# Patient Record
Sex: Male | Born: 1955 | Race: White | Hispanic: No | Marital: Single | Smoking: Former smoker
Health system: Southern US, Community
[De-identification: ages and names within clinical notes are randomized; demographics above are authoritative.]

## PROBLEM LIST (undated history)

## (undated) DIAGNOSIS — M1990 Unspecified osteoarthritis, unspecified site: Secondary | ICD-10-CM

## (undated) DIAGNOSIS — T1590XA Foreign body on external eye, part unspecified, unspecified eye, initial encounter: Secondary | ICD-10-CM

## (undated) DIAGNOSIS — I10 Essential (primary) hypertension: Secondary | ICD-10-CM

## (undated) DIAGNOSIS — K2970 Gastritis, unspecified, without bleeding: Secondary | ICD-10-CM

## (undated) DIAGNOSIS — K429 Umbilical hernia without obstruction or gangrene: Secondary | ICD-10-CM

## (undated) DIAGNOSIS — M542 Cervicalgia: Secondary | ICD-10-CM

## (undated) DIAGNOSIS — Z86010 Personal history of colonic polyps: Secondary | ICD-10-CM

## (undated) DIAGNOSIS — R7303 Prediabetes: Secondary | ICD-10-CM

## (undated) DIAGNOSIS — I82409 Acute embolism and thrombosis of unspecified deep veins of unspecified lower extremity: Secondary | ICD-10-CM

## (undated) DIAGNOSIS — F1910 Other psychoactive substance abuse, uncomplicated: Secondary | ICD-10-CM

## (undated) DIAGNOSIS — N400 Enlarged prostate without lower urinary tract symptoms: Secondary | ICD-10-CM

## (undated) DIAGNOSIS — G709 Myoneural disorder, unspecified: Secondary | ICD-10-CM

## (undated) DIAGNOSIS — F329 Major depressive disorder, single episode, unspecified: Secondary | ICD-10-CM

## (undated) DIAGNOSIS — K219 Gastro-esophageal reflux disease without esophagitis: Secondary | ICD-10-CM

## (undated) DIAGNOSIS — K649 Unspecified hemorrhoids: Secondary | ICD-10-CM

## (undated) HISTORY — DX: Benign prostatic hyperplasia without lower urinary tract symptoms: N40.0

## (undated) HISTORY — DX: Unspecified hemorrhoids: K64.9

## (undated) HISTORY — PX: HERNIA REPAIR: SHX51

---

## 1988-11-27 HISTORY — PX: WRIST SURGERY: SHX841

## 1997-11-27 HISTORY — PX: NISSEN FUNDOPLICATION: SHX2091

## 1997-11-27 HISTORY — PX: ABDOMINAL SURGERY: SHX537

## 1999-01-25 ENCOUNTER — Ambulatory Visit (HOSPITAL_COMMUNITY): Admission: RE | Admit: 1999-01-25 | Discharge: 1999-01-25 | Payer: Self-pay | Admitting: Specialist

## 2001-06-03 ENCOUNTER — Encounter: Payer: Self-pay | Admitting: Specialist

## 2001-06-03 ENCOUNTER — Encounter: Admission: RE | Admit: 2001-06-03 | Discharge: 2001-06-03 | Payer: Self-pay | Admitting: Specialist

## 2002-02-10 ENCOUNTER — Encounter: Admission: RE | Admit: 2002-02-10 | Discharge: 2002-02-10 | Payer: Self-pay | Admitting: Specialist

## 2002-02-10 ENCOUNTER — Encounter: Payer: Self-pay | Admitting: Specialist

## 2002-07-02 ENCOUNTER — Encounter: Admission: RE | Admit: 2002-07-02 | Discharge: 2002-07-02 | Payer: Self-pay | Admitting: Specialist

## 2002-07-02 ENCOUNTER — Encounter: Payer: Self-pay | Admitting: Specialist

## 2003-01-06 ENCOUNTER — Ambulatory Visit (HOSPITAL_COMMUNITY): Admission: RE | Admit: 2003-01-06 | Discharge: 2003-01-06 | Payer: Self-pay | Admitting: Family Medicine

## 2003-01-06 ENCOUNTER — Encounter: Payer: Self-pay | Admitting: Family Medicine

## 2003-01-07 ENCOUNTER — Ambulatory Visit (HOSPITAL_COMMUNITY): Admission: RE | Admit: 2003-01-07 | Discharge: 2003-01-07 | Payer: Self-pay | Admitting: Family Medicine

## 2003-01-07 ENCOUNTER — Encounter: Payer: Self-pay | Admitting: Family Medicine

## 2004-01-12 ENCOUNTER — Ambulatory Visit (HOSPITAL_COMMUNITY): Admission: RE | Admit: 2004-01-12 | Discharge: 2004-01-12 | Payer: Self-pay | Admitting: Family Medicine

## 2004-01-13 ENCOUNTER — Emergency Department (HOSPITAL_COMMUNITY): Admission: EM | Admit: 2004-01-13 | Discharge: 2004-01-13 | Payer: Self-pay | Admitting: Emergency Medicine

## 2004-05-23 ENCOUNTER — Encounter (INDEPENDENT_AMBULATORY_CARE_PROVIDER_SITE_OTHER): Payer: Self-pay | Admitting: Specialist

## 2004-05-23 ENCOUNTER — Ambulatory Visit (HOSPITAL_COMMUNITY): Admission: RE | Admit: 2004-05-23 | Discharge: 2004-05-23 | Payer: Self-pay | Admitting: *Deleted

## 2005-11-27 HISTORY — PX: BUNIONECTOMY: SHX129

## 2008-04-16 ENCOUNTER — Other Ambulatory Visit: Payer: Self-pay

## 2008-04-16 ENCOUNTER — Ambulatory Visit: Payer: Self-pay | Admitting: Podiatry

## 2008-04-24 ENCOUNTER — Ambulatory Visit: Payer: Self-pay | Admitting: Podiatry

## 2008-06-09 ENCOUNTER — Ambulatory Visit: Payer: Self-pay | Admitting: Gastroenterology

## 2009-08-18 ENCOUNTER — Observation Stay (HOSPITAL_COMMUNITY): Admission: EM | Admit: 2009-08-18 | Discharge: 2009-08-19 | Payer: Self-pay | Admitting: Emergency Medicine

## 2009-08-18 ENCOUNTER — Ambulatory Visit: Payer: Self-pay | Admitting: Cardiology

## 2011-03-03 LAB — CK TOTAL AND CKMB (NOT AT ARMC)
CK, MB: 3.8 ng/mL (ref 0.3–4.0)
Relative Index: 3 — ABNORMAL HIGH (ref 0.0–2.5)
Relative Index: 3.4 — ABNORMAL HIGH (ref 0.0–2.5)
Total CK: 153 U/L (ref 7–232)

## 2011-03-03 LAB — POCT CARDIAC MARKERS: Myoglobin, poc: 86.8 ng/mL (ref 12–200)

## 2011-03-03 LAB — URINALYSIS, ROUTINE W REFLEX MICROSCOPIC
Glucose, UA: NEGATIVE mg/dL
Ketones, ur: NEGATIVE mg/dL
Leukocytes, UA: NEGATIVE
Protein, ur: NEGATIVE mg/dL
pH: 5 (ref 5.0–8.0)

## 2011-03-03 LAB — DIFFERENTIAL
Basophils Relative: 0 % (ref 0–1)
Eosinophils Absolute: 0.1 10*3/uL (ref 0.0–0.7)
Eosinophils Relative: 1 % (ref 0–5)
Neutrophils Relative %: 74 % (ref 43–77)

## 2011-03-03 LAB — CBC
HCT: 43.5 % (ref 39.0–52.0)
MCHC: 33.5 g/dL (ref 30.0–36.0)
MCV: 85.4 fL (ref 78.0–100.0)
Platelets: 235 10*3/uL (ref 150–400)
RDW: 14.7 % (ref 11.5–15.5)

## 2011-03-03 LAB — URINE MICROSCOPIC-ADD ON

## 2011-03-03 LAB — TROPONIN I
Troponin I: 0.01 ng/mL (ref 0.00–0.06)
Troponin I: 0.01 ng/mL (ref 0.00–0.06)
Troponin I: 0.02 ng/mL (ref 0.00–0.06)

## 2011-03-03 LAB — BASIC METABOLIC PANEL
BUN: 30 mg/dL — ABNORMAL HIGH (ref 6–23)
CO2: 26 mEq/L (ref 19–32)
Chloride: 106 mEq/L (ref 96–112)
Creatinine, Ser: 0.95 mg/dL (ref 0.4–1.5)
Glucose, Bld: 100 mg/dL — ABNORMAL HIGH (ref 70–99)
Potassium: 4.3 mEq/L (ref 3.5–5.1)

## 2011-04-14 NOTE — Op Note (Signed)
NAME:  Louis Salazar, Louis Salazar                        ACCOUNT NO.:  [id number]   MEDICAL RECORD NO.:  [id number]                   PATIENT TYPE:  AMB   LOCATION:  ENDO                                 FACILITY:  [facility]   PHYSICIAN:  Georgiana Spinner, M.D.                 DATE OF BIRTH:  1956-04-26   DATE OF PROCEDURE:  DATE OF DISCHARGE:                                 OPERATIVE REPORT   PROCEDURE:  Colonoscopy.   INDICATIONS:  Colon polyp, colon cancer screening and abdominal pain.   ANESTHESIA:  Demerol 100 mg, Versed 10 mg IV, Phenergan 25 mg were given  intravenously in divided doses.   DESCRIPTION OF PROCEDURE:  With the patient mildly sedated and in the left  lateral decubitus position, the Olympus videoscopic colonoscope was inserted  in the rectum.  After normal rectal examination was performed and passed  under direct vision to the cecum, identified by ileocecal valve and base of  the cecum and appendiceal orifice, both of which were photographed.  From  this point,  the colonoscope was slowly withdrawn, taking circumferential  views of the colonic mucosa, stopping only in the rectum where a small polyp  was seen, photographed and removed using hot biopsy forceps technique,  setting at 20/20 blended current.  The endoscope was then placed on  retroflexion and view of the anal canal from above.  The internal  hemorrhoids were noted.  At this point the endoscope was straightened and  withdrawn.  The patient's vital signs and pulse oximetry remained stable.  The patient tolerated the procedure well without apparent complications.   FINDINGS:  Small polyp at rectum, Internal hemorrhoids; otherwise  unremarkable colonoscopic examination to the cecum.   ASSESSMENT:  Etiology of abdominal pain quite possibly adhesions.  No  abnormalities on colonoscopy to explain this.  Small polyp at rectum  removed.   PLAN:  Await biopsy report.  The patient will call me for results and follow  up with  me as an outpatient.                                               Georgiana Spinner, M.D.    GMO/MEDQ  D:  05/23/2004  T:  05/23/2004  Job:  [id number]   cc:   Richrd Humbles, M.D.  [facility]  [address]  [place] [address]   Lorne Skeens. Hoxworth, M.D.  [address]  [place]  [place] [address]  Fax: [phone]

## 2012-08-27 DIAGNOSIS — Z23 Encounter for immunization: Secondary | ICD-10-CM

## 2012-10-25 ENCOUNTER — Ambulatory Visit: Payer: Self-pay | Admitting: Family Medicine

## 2013-08-29 ENCOUNTER — Other Ambulatory Visit: Payer: Self-pay | Admitting: Orthopedic Surgery

## 2013-08-29 DIAGNOSIS — M25511 Pain in right shoulder: Secondary | ICD-10-CM

## 2013-09-08 ENCOUNTER — Ambulatory Visit
Admission: RE | Admit: 2013-09-08 | Discharge: 2013-09-08 | Disposition: A | Discharge status: Home / Self Care | Payer: 59 | Source: Ambulatory Visit | Attending: Orthopedic Surgery | Admitting: Orthopedic Surgery

## 2013-09-08 DIAGNOSIS — M25511 Pain in right shoulder: Secondary | ICD-10-CM

## 2014-02-02 ENCOUNTER — Emergency Department (HOSPITAL_COMMUNITY)
Admission: EM | Admit: 2014-02-02 | Discharge: 2014-02-02 | Disposition: A | Discharge status: Home / Self Care | Payer: 59 | Attending: Emergency Medicine | Admitting: Emergency Medicine

## 2014-02-02 ENCOUNTER — Encounter (HOSPITAL_COMMUNITY): Payer: Self-pay | Admitting: Emergency Medicine

## 2014-02-02 DIAGNOSIS — R040 Epistaxis: Secondary | ICD-10-CM

## 2014-02-02 DIAGNOSIS — F17200 Nicotine dependence, unspecified, uncomplicated: Secondary | ICD-10-CM | POA: Insufficient documentation

## 2014-02-02 DIAGNOSIS — M129 Arthropathy, unspecified: Secondary | ICD-10-CM | POA: Insufficient documentation

## 2014-02-02 DIAGNOSIS — Z79899 Other long term (current) drug therapy: Secondary | ICD-10-CM | POA: Insufficient documentation

## 2014-02-02 HISTORY — DX: Unspecified osteoarthritis, unspecified site: M19.90

## 2014-02-02 HISTORY — DX: Other psychoactive substance abuse, uncomplicated: F19.10

## 2014-02-02 NOTE — Progress Notes (Signed)
   CARE MANAGEMENT ED NOTE 02/02/2014  Patient:  Louis Salazar, Louis Salazar   Account Number:  [id number]  Date Initiated:  02/02/2014  Documentation initiated by:  Livia Snellen  Subjective/Objective Assessment:   Patient presents to Ed with a nose bleed     Subjective/Objective Assessment Detail:   Patient with pmhx of substance abuse and arthritis     Action/Plan:   Action/Plan Detail:   Anticipated DC Date:  02/02/2014     Status Recommendation to Physician:   Result of Recommendation:    Other ED [place]  Other  PCP issues    Choice offered to / List presented to:            Status of service:  Completed, signed off  ED Comments:   ED Comments Detail:  Patient confirms his pcp is Dr. Jacqualine Code in [place]. System updated.

## 2014-02-02 NOTE — ED Notes (Signed)
Pt continues to apply cold compress to nostril. Bleeding slowed to occasional spotting

## 2014-02-02 NOTE — ED Notes (Signed)
Per EMS pt came in for nose bleed that started spontaneously while at work today.  Pt had similar episode last week but stopped on its on. EMS administered couple sprays of Afrin in route but nose currently still bleeding.

## 2014-02-02 NOTE — Discharge Instructions (Signed)

## 2014-02-02 NOTE — ED Provider Notes (Signed)
CSN: [id number]     Arrival date & time 02/02/14  1354 History   First MD Initiated Contact with Patient 02/02/14 1507     Chief Complaint  Patient presents with  . Epistaxis    sudden onset r/nosebleed x 55 min     (Consider location/radiation/quality/duration/timing/severity/associated sxs/prior Treatment) Patient is a 58 y.o. male presenting with nosebleeds. The history is provided by the patient.  Epistaxis Location:  Bilateral Severity:  Mild Duration:  2 hours Timing:  Constant Progression:  Resolved Chronicity:  Recurrent Context: aspirin use and drug use   Context: not anticoagulants (occasional), not foreign body, not nose picking and not recent infection   Relieved by: pressure, afrin. Worsened by:  Nothing tried Associated symptoms: no blood in oropharynx, no congestion, no cough, no facial pain, no fever and no syncope   Risk factors: no alcohol use, no allergies, no change in medication, no radiation treatment and no recent chemotherapy     Past Medical History  Diagnosis Date  . Substance abuse     hx of inhalants  . Arthritis    Past Surgical History  Procedure Laterality Date  . Abdominal surgery    . Hernia repair    . Wrist surgery     Family History  Problem Relation Age of Onset  . Cancer Other    History  Substance Use Topics  . Smoking status: Current Every Day Smoker  . Smokeless tobacco: Current User    Types: Chew  . Alcohol Use: 1.8 oz/week    3 Cans of beer per week    Review of Systems  Constitutional: Negative for fever.  HENT: Positive for nosebleeds. Negative for congestion.   Respiratory: Negative for cough.   Cardiovascular: Negative for syncope.  All other systems reviewed and are negative.      Allergies  Celebrex and Sulfa antibiotics  Home Medications   Current Outpatient Rx  Name  Route  Sig  Dispense  Refill  . finasteride (PROSCAR) 5 MG tablet   Oral   Take 5 mg by mouth daily.         Marland Kitchen  glucosamine-chondroitin 500-400 MG tablet   Oral   Take 1 tablet by mouth 2 (two) times daily.         . tamsulosin (FLOMAX) 0.4 MG CAPS capsule   Oral   Take 0.4 mg by mouth daily.         . traMADol (ULTRAM) 50 MG tablet   Oral   Take 100-150 mg by mouth 2 (two) times daily. Takes 150mg  in the morning and 100mg  at lunch time         . zolpidem (AMBIEN) 10 MG tablet   Oral   Take 10 mg by mouth at bedtime as needed for sleep.          BP 147/102  Pulse 119  Temp(Src) 98.3 F (36.8 C)  Resp 22  SpO2 95% Physical Exam  Constitutional: He is oriented to person, place, and time. He appears well-developed and well-nourished. No distress.  HENT:  Head: Normocephalic and atraumatic.  Nose: Epistaxis (mild anterior bleedingo on septum bilaterally) is observed.  Mouth/Throat: No oropharyngeal exudate.  Eyes: EOM are normal. Pupils are equal, round, and reactive to light.  Neck: Normal range of motion. Neck supple.  Cardiovascular: Normal rate and regular rhythm.  Exam reveals no friction rub.   No murmur heard. Pulmonary/Chest: Effort normal and breath sounds normal. No respiratory distress. He has no wheezes. He  has no rales.  Abdominal: He exhibits no distension. There is no tenderness. There is no rebound.  Musculoskeletal: Normal range of motion. He exhibits no edema.  Neurological: He is alert and oriented to person, place, and time.  Skin: He is not diaphoretic.    ED Course  EPISTAXIS MANAGEMENT Date/Time: 02/02/2014 5:22 PM Performed by: Osvaldo Shipper Authorized by: Osvaldo Shipper Consent: Verbal consent obtained. Patient sedated: no Treatment site: right anterior Repair method: silver nitrate Post-procedure assessment: bleeding stopped Treatment complexity: simple Patient tolerance: Patient tolerated the procedure well with no immediate complications.   (including critical care time) Labs Review Labs Reviewed - No data to display Imaging  Review No results found.   EKG Interpretation None      MDM   Final diagnoses:  Anterior epistaxis    69M here with epistaxis. Resolved on my exam. Anterior bleeders seen in bilateral nares. Treatment as above. No further nosebleed after cauterization. Patient stable for discharge.  Osvaldo Shipper, MD 02/02/14 1723

## 2014-02-02 NOTE — ED Notes (Signed)
Pt reports small nose bleed , intermittently last week. "This episode is much worse" Per EMS , did not saturate towel, towel has spots of blood

## 2014-06-08 ENCOUNTER — Encounter (HOSPITAL_COMMUNITY): Payer: Self-pay | Admitting: Emergency Medicine

## 2014-06-08 ENCOUNTER — Emergency Department (HOSPITAL_COMMUNITY): Payer: 59

## 2014-06-08 ENCOUNTER — Emergency Department (HOSPITAL_COMMUNITY)
Admission: EM | Admit: 2014-06-08 | Discharge: 2014-06-08 | Disposition: A | Discharge status: Home / Self Care | Payer: 59 | Attending: Emergency Medicine | Admitting: Emergency Medicine

## 2014-06-08 DIAGNOSIS — H15102 Unspecified episcleritis, left eye: Secondary | ICD-10-CM

## 2014-06-08 DIAGNOSIS — H11419 Vascular abnormalities of conjunctiva, unspecified eye: Secondary | ICD-10-CM | POA: Insufficient documentation

## 2014-06-08 DIAGNOSIS — H15099 Other scleritis, unspecified eye: Secondary | ICD-10-CM | POA: Insufficient documentation

## 2014-06-08 DIAGNOSIS — F17200 Nicotine dependence, unspecified, uncomplicated: Secondary | ICD-10-CM | POA: Insufficient documentation

## 2014-06-08 DIAGNOSIS — H15109 Unspecified episcleritis, unspecified eye: Principal | ICD-10-CM

## 2014-06-08 DIAGNOSIS — I10 Essential (primary) hypertension: Secondary | ICD-10-CM | POA: Insufficient documentation

## 2014-06-08 DIAGNOSIS — Z888 Allergy status to other drugs, medicaments and biological substances status: Secondary | ICD-10-CM | POA: Insufficient documentation

## 2014-06-08 DIAGNOSIS — H5710 Ocular pain, unspecified eye: Secondary | ICD-10-CM | POA: Insufficient documentation

## 2014-06-08 DIAGNOSIS — H5789 Other specified disorders of eye and adnexa: Secondary | ICD-10-CM | POA: Insufficient documentation

## 2014-06-08 DIAGNOSIS — M129 Arthropathy, unspecified: Secondary | ICD-10-CM | POA: Insufficient documentation

## 2014-06-08 DIAGNOSIS — Z79899 Other long term (current) drug therapy: Secondary | ICD-10-CM | POA: Insufficient documentation

## 2014-06-08 DIAGNOSIS — R519 Headache, unspecified: Secondary | ICD-10-CM

## 2014-06-08 DIAGNOSIS — F1910 Other psychoactive substance abuse, uncomplicated: Secondary | ICD-10-CM | POA: Insufficient documentation

## 2014-06-08 DIAGNOSIS — Z882 Allergy status to sulfonamides status: Secondary | ICD-10-CM | POA: Insufficient documentation

## 2014-06-08 DIAGNOSIS — Z7982 Long term (current) use of aspirin: Secondary | ICD-10-CM | POA: Insufficient documentation

## 2014-06-08 DIAGNOSIS — R51 Headache: Secondary | ICD-10-CM

## 2014-06-08 HISTORY — DX: Essential (primary) hypertension: I10

## 2014-06-08 MED ORDER — ARTIFICIAL TEARS OP OINT: TOPICAL_OINTMENT | Freq: Four times a day (QID) | OPHTHALMIC | Status: DC

## 2014-06-08 MED ORDER — TETRACAINE HCL 0.5 % OP SOLN
2.0000 [drp] | Freq: Once | OPHTHALMIC | Status: AC
  Administered 2014-06-08: 2 [drp] via OPHTHALMIC
  Filled 2014-06-08: qty 2

## 2014-06-08 MED ORDER — FLUORESCEIN SODIUM 1 MG OP STRP
1.0000 | ORAL_STRIP | Freq: Once | OPHTHALMIC | Status: AC
  Administered 2014-06-08: 1 via OPHTHALMIC
  Filled 2014-06-08: qty 1

## 2014-06-08 NOTE — Discharge Instructions (Signed)
Scleritis and Episcleritis The outer part of the eyeball is covered with a tough fibrous covering called the sclera. It is the white part of the eye. This tough covering also has a thin membrane lying on top of it called the episclera.   When the sclera becomes red and sore (inflamed), it is called scleritis.  When the episclera becomes inflamed, it is called episcleritis. CAUSES   Scleritis is usually more severe and is associated with autoimmune diseases such as:  Rheumatoid arthritis.  Inflammations of the bowel such as Crohn's Disease (regional enteritis).  Ulcerative colitis.  Episcleritis usually has no known cause. SYMPTOMS  Both scleritis and episcleritis cause red patches or a nodule on the eye. DIAGNOSIS  This condition should be examined by an ophthalmologist. This is because very strong medications that have side effects to the body and eye may be required to treat severe attacks. Further investigations into the patient's general health may be necessary. TREATMENT   Episcleritis tends to get better without treatment within a week or two.  Scleritis is more severe. Often, your caregiver will prescribe steroids by mouth (orally) or as drops in the eye. This treatment helps lessen the redness and soreness (inflammation). HOME CARE INSTRUCTIONS   Take all medications as directed.  Keep your follow-up appointments as directed.  Avoid irritation of the involved eye(s).  Stop using hard or soft contact lenses until your caregiver tells you that it is safe to use them. SEEK MEDICAL CARE IF:   Redness or irritation gets worse.  You develop pain or sensitivity to light.  You develop any change in vision in the involved eye(s). Document Released: 11/07/2001 Document Revised: 02/05/2012 Document Reviewed: 03/11/2009 [facility] Patient Information 2015 [place], [place]. This information is not intended to replace advice given to you by your health care provider. Make sure you  discuss any questions you have with your health care provider.   You are having a headache. No specific cause was found today for your headache. It may have been a migraine or other cause of headache. Stress, anxiety, fatigue, and depression are common triggers for headaches. Your headache today does not appear to be life-threatening or require hospitalization, but often the exact cause of headaches is not determined in the emergency department. Therefore, follow-up with your doctor is very important to find out what may have caused your headache, and whether or not you need any further diagnostic testing or treatment. Sometimes headaches can appear benign (not harmful), but then more serious symptoms can develop which should prompt an immediate re-evaluation by your doctor or the emergency department. SEEK MEDICAL ATTENTION IF: You develop possible problems with medications prescribed.  The medications don't resolve your headache, if it recurs , or if you have multiple episodes of vomiting or can't take fluids. You have a change from the usual headache. RETURN IMMEDIATELY IF you develop a sudden, severe headache or confusion, become poorly responsive or faint, develop a fever above 100.62F or problem breathing, have a change in speech, vision, swallowing, or understanding, or develop new weakness, numbness, tingling, incoordination, or have a seizure.

## 2014-06-08 NOTE — ED Notes (Signed)
Patient transported to CT 

## 2014-06-08 NOTE — ED Notes (Addendum)
Per pt, headache to left side of head behind left eye starting at 9 pm last night.  This morning, pt woke up with pain and irritation to left eye.  Pt has redness to eye with no discharge. Headache has decreased.  States eye feels irritated.  Pt also had eye discomfort a month ago and had it checked with no dx.  Pt states this feels different.  Pt is not having any dizziness, orientation changes or changes in speech. Neuro status WNL

## 2014-06-08 NOTE — ED Provider Notes (Signed)
CSN: [id number]     Arrival date & time 06/08/14  0749 History   First MD Initiated Contact with Patient 06/08/14 [phone]     Chief Complaint  Patient presents with  . Headache  . Eye Pain     (Consider location/radiation/quality/duration/timing/severity/associated sxs/prior Treatment) HPI 58 year old male presents after developing a headache last night approximately 12 hours ago. States was gradually worsening when he started to get a bed. He felt like the pain was behind his left eye. The pain resolved and is able to get a bed. This morning he noticed his left lateral eye was swollen and red. He feels a mild discomfort as well. Denies any photophobia or blurred vision. No floaters. The headache is no longer present. The patient frequently gets left sided headaches that have been coming on more often but are different than the headache he states last night. About a month ago he had a similar type redness to his left thigh. He went to the ophthalmologist and they prescribed some drops but by the time he started using that his symptoms had dissipated. He is most concerned that he has a tumor causing his recurrent headaches.  Past Medical History  Diagnosis Date  . Substance abuse     hx of inhalants, last use 2010  . Arthritis   . Hypertension    Past Surgical History  Procedure Laterality Date  . Abdominal surgery    . Hernia repair    . Wrist surgery     Family History  Problem Relation Age of Onset  . Cancer Other    History  Substance Use Topics  . Smoking status: Current Every Day Smoker  . Smokeless tobacco: Current User    Types: Chew  . Alcohol Use: 1.8 oz/week    3 Cans of beer per week    Review of Systems  Eyes: Positive for pain and redness. Negative for photophobia, discharge and visual disturbance.  Gastrointestinal: Negative for vomiting.  Neurological: Positive for headaches. Negative for weakness and numbness.  All other systems reviewed and are  negative.     Allergies  Celebrex and Sulfa antibiotics  Home Medications   Prior to Admission medications   Medication Sig Start Date End Date Taking? Authorizing Provider  finasteride (PROSCAR) 5 MG tablet Take 5 mg by mouth daily.    Historical Provider, MD  glucosamine-chondroitin 500-400 MG tablet Take 1 tablet by mouth 2 (two) times daily.    Historical Provider, MD  tamsulosin (FLOMAX) 0.4 MG CAPS capsule Take 0.4 mg by mouth daily.    Historical Provider, MD  traMADol (ULTRAM) 50 MG tablet Take 100-150 mg by mouth 2 (two) times daily. Takes 150mg  in the morning and 100mg  at lunch time    Historical Provider, MD  zolpidem (AMBIEN) 10 MG tablet Take 10 mg by mouth at bedtime as needed for sleep.    Historical Provider, MD   BP 121/93  Pulse 93  Temp(Src) 98.1 F (36.7 C) (Oral)  Resp 20  SpO2 99% Physical Exam  Nursing note and vitals reviewed. Constitutional: He is oriented to person, place, and time. He appears well-developed and well-nourished.  HENT:  Head: Normocephalic and atraumatic.  Right Ear: External ear normal.  Left Ear: External ear normal.  Nose: Nose normal.  Eyes: EOM and lids are normal. Pupils are equal, round, and reactive to light. Lids are everted and swept, no foreign bodies found. Right eye exhibits no discharge. Left eye exhibits no discharge. No foreign body present  in the left eye. Left conjunctiva is injected.  Slit lamp exam:      The left eye shows no corneal abrasion and no corneal ulcer.    Pupils constricted bilaterally but are equal and react normally  Neck: Neck supple.  Cardiovascular: Normal rate, regular rhythm, normal heart sounds and intact distal pulses.   Pulmonary/Chest: Effort normal.  Abdominal: Soft. There is no tenderness.  Musculoskeletal: He exhibits no edema.  Neurological: He is alert and oriented to person, place, and time.  Normal strength in all 4 extremities.   Skin: Skin is warm and dry.    ED Course   Procedures (including critical care time) Labs Review Labs Reviewed - No data to display  Imaging Review Ct Head Wo Contrast  06/08/2014   CLINICAL DATA:  58 year old male with headache and eye pain.  EXAM: CT HEAD WITHOUT CONTRAST  TECHNIQUE: Contiguous axial images were obtained from the base of the skull through the vertex without intravenous contrast.  COMPARISON:  None.  FINDINGS: No intracranial abnormalities are identified, including mass lesion or mass effect, hydrocephalus, extra-axial fluid collection, midline shift, hemorrhage, or acute infarction.  The visualized bony calvarium is unremarkable.  A 3 mm linear hyperdense structure anterior to the left globe may lie within the soft tissue or eyelid - correlate clinically.  The globes and orbits are otherwise unremarkable.  IMPRESSION: No evidence of intracranial abnormality.  3 mm possible soft tissue foreign body anterior to the left globe - correlate clinically.   Electronically Signed   By: Hassan Rowan M.D.   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 He has no vision changes, photophobia, or significant pain or discharge. The patient's IOP is 17. Highly doubt glaucoma. His headache that he didn't seem related as he is not having a headache at this time. Heart is rate words had daily headaches for some time. CT shows no mass in this middle soft tissue foreign body has been present for 30 years per the patient and his left eyelid. On my exam there are no foreign bodies under his eye. No signs of corneal abrasion. We'll treat with artificial tears recommend ASAP followup with his ophthalmologist. He will followup later this afternoon.    Ephraim Hamburger, MD 06/08/14 [phone]

## 2014-06-08 NOTE — ED Notes (Signed)
Pt now says his eye pain is 1/10. Pt reports the headache is barely noticeable, but its the eye swelling and redness that concerns patient. Was going to go to an eye doctor but with the headache he was told to come to ED.

## 2015-04-05 ENCOUNTER — Telehealth: Payer: Self-pay | Admitting: Pain Medicine

## 2015-04-05 NOTE — Telephone Encounter (Signed)
Returning call to Caryl Pina to sched np appt.   [phone]

## 2015-04-27 ENCOUNTER — Encounter: Payer: Self-pay | Admitting: Pain Medicine

## 2015-04-27 ENCOUNTER — Ambulatory Visit: Payer: 59 | Attending: Pain Medicine | Admitting: Pain Medicine

## 2015-04-27 ENCOUNTER — Encounter (INDEPENDENT_AMBULATORY_CARE_PROVIDER_SITE_OTHER): Payer: Self-pay

## 2015-04-27 VITALS — BP 130/107 | HR 94 | Temp 98.2°F | Resp 18 | Ht 69.0 in | Wt 180.0 lb

## 2015-04-27 DIAGNOSIS — M25511 Pain in right shoulder: Secondary | ICD-10-CM | POA: Diagnosis present

## 2015-04-27 DIAGNOSIS — M7550 Bursitis of unspecified shoulder: Secondary | ICD-10-CM | POA: Insufficient documentation

## 2015-04-27 DIAGNOSIS — M19019 Primary osteoarthritis, unspecified shoulder: Secondary | ICD-10-CM | POA: Insufficient documentation

## 2015-04-27 DIAGNOSIS — G5600 Carpal tunnel syndrome, unspecified upper limb: Secondary | ICD-10-CM | POA: Insufficient documentation

## 2015-04-27 DIAGNOSIS — M19012 Primary osteoarthritis, left shoulder: Secondary | ICD-10-CM

## 2015-04-27 DIAGNOSIS — M19011 Primary osteoarthritis, right shoulder: Secondary | ICD-10-CM | POA: Insufficient documentation

## 2015-04-27 NOTE — Progress Notes (Signed)
Discharged to home ambulatory. Pre procedure inst given. teachback 3 done. Safety precautions to be maintained throughout the outpatient stay will include: orient to surroundings, keep bed in low position, maintain call bell within reach at all times, provide assistance with transfer out of bed and ambulation.

## 2015-04-27 NOTE — Progress Notes (Signed)
   Subjective:    Patient ID: Louis Salazar, male    DOB: [birth year], 59 y.o.   MRN: [id number]  HPI Patient is 59 year old gentleman who comes to pain management Center at the request of Dr. Luan Pulling for further evaluation and treatment of pain involving the region of the shoulder especially the right shoulder . Patient is with history of pain involving the region of the right shoulder wishes interferes with activities of daily living to significant degree.. The patient describes pain as aching heavy horrible sharp throbbing tingling uncomfortable sensation awakening patient's from sleep and interfering with patient ability to go to sleep the pain occasionally has been associated with tingling sensation and numbness. Patient states that the pain increases with bending kneeling lifting sitting standing walking and the pain is decreased with sleeping Use of TENS unit and medications. Patient states that he is undergone prior treatment of the shoulder including interventional treatment without surgical intervention area we discussed patient's condition and will consider patient for interventional treatment time return appointment in attempt to decrease severity of symptoms and hopefully minimize progression of patient's symptoms and as well as hopefully avoid the potential for medication escalation and need for more involved treatment. Was understanding and agreed with suggested treatment plan.     Review of Systems Cardiovascular High blood pressure  Pulmonary Unremarkable   neurological Unremarkable  Psychological Unremarkable  Gastrointestinal Constipation  Genitourinary  Unremarkable  Hematological Unremarkable  Endocrine Unremarkable  Rheumatological Unremarkable  Other significant history Unremarkable         Objective:   Physical Exam  There was tenderness of the splenius capitis and occipitalis musculature region of moderate degree to moderately severe degree  with severe tenderness over the trapezius levator scapula and rhomboid musculature regions especially on the right. There appeared to be with unremarkable Spurling's maneuver. Patient had difficulty performing the drop test. Grip strength appeared to be decreased slightly. Tinel and Phalen maneuver with mild to moderate increased pain. Palpation of the mid and lower thoracic region were was tenderness to palpation without crepitus of the thoracic region noted. Palpation over the lumbar paraspinal musculatures and lumbar facet region associated with mild to moderate discomfort with lateral bending and rotation reproducing mild to moderate discomfort. Excessive tends to palpation noted over the spinous processes of the cervical thoracic or lumbar regions. Moderate tenderness of the PSIS and PII S regions. There was negative clonus negative Homans. No definite sensory deficit of dermatomal distribution detected. Abdomen nontender. No costovertebral angle tenderness noted.       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  Suprascapular nerve block to be performed at time of return appointment  F/U PCP for evaliation of  BP and general medical  condition. Patient informed to follow-up with Dr. Luan Pulling this week for evaluation of elevated blood pressure and for evaluation of general medical condition  F/U surgical evaluation.  F/U neurological evaluation.  May consider radiofrequency rhizolysis or intraspinal procedures pending response to present treatment and F/U evaluation.  Patient to call Pain Management Center should patient have concerns prior to scheduled return appointment.

## 2015-04-27 NOTE — Patient Instructions (Addendum)
Continue present medications  Suprascapular nerve block Monday, 05/10/2015.  F/U PCP for evaliation of  BP and general medical  condition.. See Dr. Luan Pulling for blood pressure which was elevated on today's evaluation   F/U surgical evaluation.  F/U neurological evaluation.  May consider radiofrequency rhizolysis or intraspinal procedures pending response to present treatment and F/U evaluation.  Patient to call [facility] should patient have concerns prior to scheduled return appointment. Pain Management Discharge Instructions  General Discharge Instructions :  If you need to reach your doctor call: Monday-Friday 8:00 am - 4:00 pm at [phone] or toll free [phone].  After clinic hours [phone] to have operator reach doctor.  Bring all of your medication bottles to all your appointments in the pain clinic.  To cancel or reschedule your appointment with Pain Management please remember to call 24 hours in advance to avoid a fee.  Refer to the educational materials which you have been given on: General Risks, I had my Procedure. Discharge Instructions, Post Sedation.  Post Procedure Instructions:  The drugs you were given will stay in your system until tomorrow, so for the next 24 hours you should not drive, make any legal decisions or drink any alcoholic beverages.  You may eat anything you prefer, but it is better to start with liquids then soups and crackers, and gradually work up to solid foods.  Please notify your doctor immediately if you have any unusual bleeding, trouble breathing or pain that is not related to your normal pain.  Depending on the type of procedure that was done, some parts of your body may feel week and/or numb.  This usually clears up by tonight or the next day.  Walk with the use of an assistive device or accompanied by an adult for the 24 hours.  You may use ice on the affected area for the first 24 hours.  Put ice in a Ziploc bag  and cover with a towel and place against area 15 minutes on 15 minutes off.  You may switch to heat after 24 hours.GENERAL RISKS AND COMPLICATIONS  What are the risk, side effects and possible complications? Generally speaking, most procedures are safe.  However, with any procedure there are risks, side effects, and the possibility of complications.  The risks and complications are dependent upon the sites that are lesioned, or the type of nerve block to be performed.  The closer the procedure is to the spine, the more serious the risks are.  Great care is taken when placing the radio frequency needles, block needles or lesioning probes, but sometimes complications can occur.  Infection: Any time there is an injection through the skin, there is a risk of infection.  This is why sterile conditions are used for these blocks.  There are four possible types of infection.  Localized skin infection.  Central Nervous System Infection-This can be in the form of Meningitis, which can be deadly.  Epidural Infections-This can be in the form of an epidural abscess, which can cause pressure inside of the spine, causing compression of the spinal cord with subsequent paralysis. This would require an emergency surgery to decompress, and there are no guarantees that the patient would recover from the paralysis.  Discitis-This is an infection of the intervertebral discs.  It occurs in about 1% of discography procedures.  It is difficult to treat and it may lead to surgery.        2. Pain: the needles have to go through skin and  soft tissues, will cause soreness.       3. Damage to internal structures:  The nerves to be lesioned may be near blood vessels or    other nerves which can be potentially damaged.       4. Bleeding: Bleeding is more common if the patient is taking blood thinners such as  aspirin, Coumadin, Ticiid, Plavix, etc., or if he/she have some genetic predisposition  such as hemophilia. Bleeding into  the spinal canal can cause compression of the spinal  cord with subsequent paralysis.  This would require an emergency surgery to  decompress and there are no guarantees that the patient would recover from the  paralysis.       5. Pneumothorax:  Puncturing of a lung is a possibility, every time a needle is introduced in  the area of the chest or upper back.  Pneumothorax refers to free air around the  collapsed lung(s), inside of the thoracic cavity (chest cavity).  Another two possible  complications related to a similar event would include: Hemothorax and Chylothorax.   These are variations of the Pneumothorax, where instead of air around the collapsed  lung(s), you may have blood or chyle, respectively.       6. Spinal headaches: They may occur with any procedures in the area of the spine.       7. Persistent CSF (Cerebro-Spinal Fluid) leakage: This is a rare problem, but may occur  with prolonged intrathecal or epidural catheters either due to the formation of a fistulous  track or a dural tear.       8. Nerve damage: By working so close to the spinal cord, there is always a possibility of  nerve damage, which could be as serious as a permanent spinal cord injury with  paralysis.       9. Death:  Although rare, severe deadly allergic reactions known as "Anaphylactic  reaction" can occur to any of the medications used.      10. Worsening of the symptoms:  We can always make thing worse.  What are the chances of something like this happening? Chances of any of this occuring are extremely low.  By statistics, you have more of a chance of getting killed in a motor vehicle accident: while driving to the hospital than any of the above occurring .  Nevertheless, you should be aware that they are possibilities.  In general, it is similar to taking a shower.  Everybody knows that you can slip, hit your head and get killed.  Does that mean that you should not shower again?  Nevertheless always keep in mind that  statistics do not mean anything if you happen to be on the wrong side of them.  Even if a procedure has a 1 (one) in a 1,000,000 (million) chance of going wrong, it you happen to be that one..Also, keep in mind that by statistics, you have more of a chance of having something go wrong when taking medications.  Who should not have this procedure? If you are on a blood thinning medication (e.g. Coumadin, Plavix, see list of "Blood Thinners"), or if you have an active infection going on, you should not have the procedure.  If you are taking any blood thinners, please inform your physician.  How should I prepare for this procedure?  Do not eat or drink anything at least six hours prior to the procedure.  Bring a driver with you .  It cannot be a taxi.  Come accompanied by an adult that can drive you back, and that is strong enough to help you if your legs get weak or numb from the local anesthetic.  Take all of your medicines the morning of the procedure with just enough water to swallow them.  If you have diabetes, make sure that you are scheduled to have your procedure done first thing in the morning, whenever possible.  If you have diabetes, take only half of your insulin dose and notify our nurse that you have done so as soon as you arrive at the clinic.  If you are diabetic, but only take blood sugar pills (oral hypoglycemic), then do not take them on the morning of your procedure.  You may take them after you have had the procedure.  Do not take aspirin or any aspirin-containing medications, at least eleven (11) days prior to the procedure.  They may prolong bleeding.  Wear loose fitting clothing that may be easy to take off and that you would not mind if it got stained with Betadine or blood.  Do not wear any jewelry or perfume  Remove any nail coloring.  It will interfere with some of our monitoring equipment.  NOTE: Remember that this is not meant to be interpreted as a complete  list of all possible complications.  Unforeseen problems may occur.  BLOOD THINNERS The following drugs contain aspirin or other products, which can cause increased bleeding during surgery and should not be taken for 2 weeks prior to and 1 week after surgery.  If you should need take something for relief of minor pain, you may take acetaminophen which is found in Tylenol,m Datril, Anacin-3 and Panadol. It is not blood thinner. The products listed below are.  Do not take any of the products listed below in addition to any listed on your instruction sheet.  A.P.C or A.P.C with Codeine Codeine Phosphate Capsules #3 Ibuprofen Ridaura  ABC compound Congesprin Imuran rimadil  Advil Cope Indocin Robaxisal  Alka-Seltzer Effervescent Pain Reliever and Antacid Coricidin or Coricidin-D  Indomethacin Rufen  Alka-Seltzer plus Cold Medicine Cosprin Ketoprofen S-A-C Tablets  Anacin Analgesic Tablets or Capsules Coumadin Korlgesic Salflex  Anacin Extra Strength Analgesic tablets or capsules CP-2 Tablets Lanoril Salicylate  Anaprox Cuprimine Capsules Levenox Salocol  Anexsia-D Dalteparin Magan Salsalate  Anodynos Darvon compound Magnesium Salicylate Sine-off  Ansaid Dasin Capsules Magsal Sodium Salicylate  Anturane Depen Capsules Marnal Soma  APF Arthritis pain formula Dewitt's Pills Measurin Stanback  Argesic Dia-Gesic Meclofenamic Sulfinpyrazone  Arthritis Bayer Timed Release Aspirin Diclofenac Meclomen Sulindac  Arthritis pain formula Anacin Dicumarol Medipren Supac  Analgesic (Safety coated) Arthralgen Diffunasal Mefanamic Suprofen  Arthritis Strength Bufferin Dihydrocodeine Mepro Compound Suprol  Arthropan liquid Dopirydamole Methcarbomol with Aspirin Synalgos  ASA tablets/Enseals Disalcid Micrainin Tagament  Ascriptin Doan's Midol Talwin  Ascriptin A/D Dolene Mobidin Tanderil  Ascriptin Extra Strength Dolobid Moblgesic Ticlid  Ascriptin with Codeine Doloprin or Doloprin with Codeine Momentum  Tolectin  Asperbuf Duoprin Mono-gesic Trendar  Aspergum Duradyne Motrin or Motrin IB Triminicin  Aspirin plain, buffered or enteric coated Durasal Myochrisine Trigesic  Aspirin Suppositories Easprin Nalfon Trillsate  Aspirin with Codeine Ecotrin Regular or Extra Strength Naprosyn Uracel  Atromid-S Efficin Naproxen Ursinus  Auranofin Capsules Elmiron Neocylate Vanquish  Axotal Emagrin Norgesic Verin  Azathioprine Empirin or Empirin with Codeine Normiflo Vitamin E  Azolid Emprazil Nuprin Voltaren  Bayer Aspirin plain, buffered or children's or timed BC Tablets or powders Encaprin Orgaran Warfarin Sodium  Buff-a-Comp Enoxaparin Orudis Zorpin  Buff-a-Comp with Codeine Equegesic Os-Cal-Gesic   Buffaprin Excedrin plain, buffered or Extra Strength Oxalid   Bufferin Arthritis Strength Feldene Oxphenbutazone   Bufferin plain or Extra Strength Feldene Capsules Oxycodone with Aspirin   Bufferin with Codeine Fenoprofen Fenoprofen Pabalate or Pabalate-SF   Buffets II Flogesic Panagesic   Buffinol plain or Extra Strength Florinal or Florinal with Codeine Panwarfarin   Buf-Tabs Flurbiprofen Penicillamine   Butalbital Compound Four-way cold tablets Penicillin   Butazolidin Fragmin Pepto-Bismol   Carbenicillin Geminisyn Percodan   Carna Arthritis Reliever Geopen Persantine   Carprofen Gold's salt Persistin   Chloramphenicol Goody's Phenylbutazone   Chloromycetin Haltrain Piroxlcam   Clmetidine heparin Plaquenil   Cllnoril Hyco-pap Ponstel   Clofibrate Hydroxy chloroquine Propoxyphen         Before stopping any of these medications, be sure to consult the physician who ordered them.  Some, such as Coumadin (Warfarin) are ordered to prevent or treat serious conditions such as "deep thrombosis", "pumonary embolisms", and other heart problems.  The amount of time that you may need off of the medication may also vary with the medication and the reason for which you were taking it.  If you are taking any  of these medications, please make sure you notify your pain physician before you undergo any procedures.         Trigger Point Injection Trigger points are areas where you have muscle pain. A trigger point injection is a shot given in the trigger point to relieve that pain. A trigger point might feel like a knot in your muscle. It hurts to press on a trigger point. Sometimes the pain spreads out (radiates) to other parts of the body. For example, pressing on a trigger point in your shoulder might cause pain in your arm or neck. You might have one trigger point. Or, you might have more than one. People often have trigger points in their upper back and lower back. They also occur often in the neck and shoulders. Pain from a trigger point lasts for a long time. It can make it hard to keep moving. You might not be able to do the exercise or physical therapy that could help you deal with the pain. A trigger point injection may help. It does not work for everyone. But, it may relieve your pain for a few days or a few months. A trigger point injection does not cure long-lasting (chronic) pain. LET YOUR CAREGIVER KNOW ABOUT:  Any allergies (especially to latex, lidocaine, or steroids).  Blood-thinning medicines that you take. These drugs can lead to bleeding or bruising after an injection. They include:  Aspirin.  Ibuprofen.  Clopidogrel.  Warfarin.  Other medicines you take. This includes all vitamins, herbs, eyedrops, over-the-counter medicines, and creams.  Use of steroids.  Recent infections.  Past problems with numbing medicines.  Bleeding problems.  Surgeries you have had.  Other health problems. RISKS AND COMPLICATIONS A trigger point injection is a safe treatment. However, problems may develop, such as:  Minor side effects usually go away in 1 to 2 days. These may include:  Soreness.  Bruising.  Stiffness.  More serious problems are rare. But, they may  include:  Bleeding under the skin (hematoma).  Skin infection.  Breaking off of the needle under your skin.  Lung puncture.  The trigger point injection may not work for you. BEFORE THE PROCEDURE You may need to stop taking any medicine that thins your blood. This is to prevent bleeding and bruising. Usually  these medicines are stopped several days before the injection. No other preparation is needed. PROCEDURE  A trigger point injection can be given in your caregiver's office or in a clinic. Each injection takes 2 minutes or less.  Your caregiver will feel for trigger points. The caregiver may use a marker to circle the area for the injection.  The skin over the trigger point will be washed with a germ-killing (antiseptic) solution.  The caregiver pinches the spot for the injection.  Then, a very thin needle is used for the shot. You may feel pain or a twitching feeling when the needle enters the trigger point.  A numbing solution may be injected into the trigger point. Sometimes a drug to keep down swelling, redness, and warmth (inflammation) is also injected.  Your caregiver moves the needle around the trigger zone until the tightness and twitching goes away.  After the injection, your caregiver may put gentle pressure over the injection site.  Then it is covered with a bandage. AFTER THE PROCEDURE  You can go right home after the injection.  The bandage can be taken off after a few hours.  You may feel sore and stiff for 1 to 2 days.  Go back to your regular activities slowly. Your caregiver may ask you to stretch your muscles. Do not do anything that takes extra energy for a few days.  Follow your caregiver's instructions to manage and treat other pain. Document Released: 11/02/2011 Document Revised: 03/10/2013 Document Reviewed: 11/02/2011 [facility] Patient Information 2015 [place], [place]. This information is not intended to replace advice given to you by your health  care provider. Make sure you discuss any questions you have with your health care provider.

## 2015-04-30 ENCOUNTER — Telehealth: Payer: Self-pay

## 2015-04-30 NOTE — Telephone Encounter (Signed)
Patient called wanting Ambien and Tramadol because Pain Mng would not prescribe on first visit. Amy reviewed the [place] and he had not filled controlled substance in [place]. He called me back so we went over all this and he said he only has 12 days of AMBIEN left when on 04/09/2015 you wrote for 60 days. He refused OV. He also said Pain Mng treated him like an addict and he refused to sign paper stating he may become addicted to Tramadol. He wants to come off ALL meds and stop seeing all doctor period. I advised we have never given the Tramadol and he would have to be seen and he said forget that.

## 2015-05-03 ENCOUNTER — Telehealth: Payer: Self-pay | Admitting: Family Medicine

## 2015-05-03 NOTE — Telephone Encounter (Signed)
Discussed with Dr. Luan Pulling who agrees and will need to see him for anything and he will be referred to Psych if needing Ambien still.[facility]

## 2015-05-03 NOTE — Telephone Encounter (Signed)
Pt. Called  States he did not  Need  Refill on that  Med pt. Call back # is  [phone]

## 2015-05-04 ENCOUNTER — Other Ambulatory Visit: Payer: Self-pay | Admitting: Pain Medicine

## 2015-05-04 NOTE — Telephone Encounter (Signed)
Discussed with Dr.Hawkins and called [facility] to verify that he has enough Ambien to last until 05/30/2015. I advised on Detailed message that he wiill need appt to refill and should have been doing 1/2 of the 60 pills we sent. If there is any issues he will call back for me but I was clear he MUST make appt for any Rx. [facility]

## 2015-05-10 ENCOUNTER — Ambulatory Visit: Payer: Self-pay | Admitting: Pain Medicine

## 2015-05-24 ENCOUNTER — Encounter: Payer: Self-pay | Admitting: Family Medicine

## 2015-05-24 ENCOUNTER — Ambulatory Visit (INDEPENDENT_AMBULATORY_CARE_PROVIDER_SITE_OTHER): Payer: 59 | Admitting: Family Medicine

## 2015-05-24 VITALS — BP 123/89 | HR 88 | Resp 16 | Ht 71.0 in | Wt 264.0 lb

## 2015-05-24 DIAGNOSIS — I10 Essential (primary) hypertension: Secondary | ICD-10-CM | POA: Diagnosis not present

## 2015-05-24 DIAGNOSIS — Z860100 Personal history of colon polyps, unspecified: Secondary | ICD-10-CM

## 2015-05-24 DIAGNOSIS — G4700 Insomnia, unspecified: Secondary | ICD-10-CM | POA: Diagnosis not present

## 2015-05-24 DIAGNOSIS — M25511 Pain in right shoulder: Secondary | ICD-10-CM | POA: Diagnosis not present

## 2015-05-24 DIAGNOSIS — N400 Enlarged prostate without lower urinary tract symptoms: Secondary | ICD-10-CM | POA: Insufficient documentation

## 2015-05-24 DIAGNOSIS — M25519 Pain in unspecified shoulder: Secondary | ICD-10-CM | POA: Insufficient documentation

## 2015-05-24 DIAGNOSIS — IMO0001 Reserved for inherently not codable concepts without codable children: Secondary | ICD-10-CM | POA: Insufficient documentation

## 2015-05-24 HISTORY — DX: Personal history of colonic polyps: Z86.010

## 2015-05-24 HISTORY — DX: Personal history of colon polyps, unspecified: Z86.0100

## 2015-05-24 LAB — CBC WITH DIFFERENTIAL/PLATELET
BASOS: 1 %
Basophils Absolute: 0.1 10*3/uL (ref 0.0–0.2)
EOS (ABSOLUTE): 0.2 10*3/uL (ref 0.0–0.4)
EOS: 3 %
HEMOGLOBIN: 15.2 g/dL (ref 12.6–17.7)
Hematocrit: 44.3 % (ref 37.5–51.0)
IMMATURE GRANULOCYTES: 0 %
Immature Grans (Abs): 0 10*3/uL (ref 0.0–0.1)
Lymphocytes Absolute: 2 10*3/uL (ref 0.7–3.1)
Lymphs: 26 %
MCH: 29.4 pg (ref 26.6–33.0)
MCHC: 34.3 g/dL (ref 31.5–35.7)
MCV: 86 fL (ref 79–97)
MONOS ABS: 0.6 10*3/uL (ref 0.1–0.9)
Monocytes: 8 %
NEUTROS PCT: 62 %
Neutrophils Absolute: 4.7 10*3/uL (ref 1.4–7.0)
Platelets: 223 10*3/uL (ref 150–379)
RBC: 5.17 x10E6/uL (ref 4.14–5.80)
RDW: 14.5 % (ref 12.3–15.4)
WBC: 7.6 10*3/uL (ref 3.4–10.8)

## 2015-05-24 MED ORDER — ZOLPIDEM TARTRATE 5 MG PO TABS: ORAL_TABLET | ORAL | Status: DC

## 2015-05-24 NOTE — Progress Notes (Signed)
Name: Louis Salazar   MRN: [id number]    DOB: 04/28/1956   Date:05/24/2015       Progress Note  Subjective  Chief Complaint  Chief Complaint  Patient presents with  . Insomnia    HPI  Here for f/u of insomnia. Also for f/u of HBP, shoulder pain, BPH.  Having some trouble sleeping on nights off Ambien.    Past Medical History  Diagnosis Date  . Substance abuse     hx of inhalants, last use 2010  . Arthritis   . Hypertension     Past Surgical History  Procedure Laterality Date  . Abdominal surgery    . Hernia repair    . Wrist surgery Right     shoulder    Family History  Problem Relation Age of Onset  . Cancer Other   . COPD Mother   . Arthritis Maternal Grandmother   . Cancer Maternal Grandmother   . Diabetes Maternal Grandmother     History   Social History  . Marital Status: Divorced    Spouse Name: N/A  . Number of Children: N/A  . Years of Education: N/A   Occupational History  . Not on file.   Social History Main Topics  . Smoking status: Former Smoker -- 30 years  . Smokeless tobacco: Current User    Types: Chew  . Alcohol Use: 1.8 oz/week    3 Cans of beer per week  . Drug Use: No  . Sexual Activity: Not on file   Other Topics Concern  . Not on file   Social History Narrative     Current outpatient prescriptions:  .  finasteride (PROSCAR) 5 MG tablet, Take 5 mg by mouth daily with lunch. , Disp: , Rfl:  .  metoprolol succinate (TOPROL-XL) 50 MG 24 hr tablet, Take 75 mg by mouth every morning. Take with or immediately following a meal., Disp: , Rfl:  .  sertraline (ZOLOFT) 50 MG tablet, Take 50 mg by mouth daily., Disp: , Rfl:  .  traMADol (ULTRAM) 50 MG tablet, Take 50 mg by mouth daily. Takes 150mg  in the morning and 100mg  at lunch time, Disp: , Rfl:  .  zolpidem (AMBIEN) 10 MG tablet, Take 10 mg by mouth at bedtime as needed for sleep. Taking 1/2, Disp: , Rfl:  .  artificial tears (LACRILUBE) OINT ophthalmic ointment, Place into the  left eye 4 (four) times daily. (Patient not taking: Reported on 04/27/2015), Disp: 3.5 g, Rfl: 0 .  aspirin EC 325 MG tablet, Take 650 mg by mouth every 6 (six) hours as needed for mild pain., Disp: , Rfl:  .  clomiPHENE (CLOMID) 50 MG tablet, Take by mouth daily., Disp: , Rfl:  .  ibuprofen (ADVIL,MOTRIN) 200 MG tablet, Take 400 mg by mouth every 6 (six) hours as needed for mild pain., Disp: , Rfl:  .  tamsulosin (FLOMAX) 0.4 MG CAPS capsule, Take 0.4 mg by mouth daily with lunch. , Disp: , Rfl:   Allergies  Allergen Reactions  . Sulfa Antibiotics Other (See Comments)    Unknown - childhood allergy  . Celebrex [Celecoxib] Hives and Rash     Review of Systems  Constitutional: Negative.  Negative for fever, chills and weight loss.  HENT: Negative.   Eyes: Negative.  Negative for blurred vision and double vision.  Respiratory: Negative.  Negative for cough, sputum production, shortness of breath and wheezing.   Cardiovascular: Negative.  Negative for chest pain, palpitations, orthopnea and  leg swelling.  Gastrointestinal: Negative for heartburn, nausea, vomiting, abdominal pain, diarrhea and blood in stool.  Genitourinary: Positive for frequency. Negative for dysuria and hematuria. Urgency: nocturia x 2.  Musculoskeletal: Positive for joint pain (R. shoulder). Negative for myalgias.  Skin: Negative.  Negative for rash.  Neurological: Negative.  Negative for dizziness, sensory change, focal weakness, weakness and headaches.      Objective  Filed Vitals:   05/24/15 0814  BP: 123/89  Pulse: 94  Resp: 16  Height: 5\' 11"  (1.803 m)  Weight: 264 lb (119.75 kg)    Physical Exam  Constitutional: He is oriented to person, place, and time and well-developed, well-nourished, and in no distress. No distress.  HENT:  Head: Normocephalic and atraumatic.  Eyes: Conjunctivae and EOM are normal. Pupils are equal, round, and reactive to light. No scleral icterus.  Neck: Normal range of motion.  Neck supple. No thyromegaly present.  Cardiovascular: Normal rate, regular rhythm, normal heart sounds and intact distal pulses.  Exam reveals no gallop and no friction rub.   No murmur heard. Pulmonary/Chest: Effort normal. No respiratory distress. He has no wheezes. He has no rales.  Abdominal: Bowel sounds are normal. He exhibits no mass. There is no tenderness.  Musculoskeletal: He exhibits no edema.  Lymphadenopathy:    He has no cervical adenopathy.  Neurological: He is alert and oriented to person, place, and time.  Psychiatric: Mood, memory, affect and judgment normal.  Vitals reviewed.        Assessment & Plan  Problem List Items Addressed This Visit    None     1. Insomnia  - zolpidem (AMBIEN) 5 MG tablet; Take 1 tablet every other nfor 1 month, then 1/2 every other  night.  Dispense: 20 tablet; Refill: 1  2. Essential (primary) hypertension -continue meds. - Comprehensive Metabolic Panel (CMET) - Lipid Profile  3. Benign fibroma of prostate -continue meds  4. Arthralgia of shoulder, right -Use Tylenol prn shoulder pain - CBC with Differential

## 2015-05-25 LAB — COMPREHENSIVE METABOLIC PANEL
A/G RATIO: 1.5 (ref 1.1–2.5)
ALT: 11 IU/L (ref 0–44)
AST: 15 IU/L (ref 0–40)
Albumin: 4 g/dL (ref 3.5–5.5)
Alkaline Phosphatase: 53 IU/L (ref 39–117)
BUN/Creatinine Ratio: 24 — ABNORMAL HIGH (ref 9–20)
BUN: 23 mg/dL (ref 6–24)
Bilirubin Total: 0.2 mg/dL (ref 0.0–1.2)
CALCIUM: 9.2 mg/dL (ref 8.7–10.2)
CHLORIDE: 102 mmol/L (ref 97–108)
CO2: 23 mmol/L (ref 18–29)
Creatinine, Ser: 0.95 mg/dL (ref 0.76–1.27)
GFR, EST AFRICAN AMERICAN: 102 mL/min/{1.73_m2} (ref >59)
GFR, EST NON AFRICAN AMERICAN: 88 mL/min/{1.73_m2} (ref >59)
Globulin, Total: 2.6 g/dL (ref 1.5–4.5)
Glucose: 93 mg/dL (ref 65–99)
Potassium: 4.7 mmol/L (ref 3.5–5.2)
Sodium: 144 mmol/L (ref 134–144)
TOTAL PROTEIN: 6.6 g/dL (ref 6.0–8.5)

## 2015-05-25 LAB — LIPID PANEL
CHOLESTEROL TOTAL: 210 mg/dL — AB (ref 100–199)
Chol/HDL Ratio: 3.8 ratio units (ref 0.0–5.0)
HDL: 56 mg/dL (ref >39)
LDL Calculated: 126 mg/dL — ABNORMAL HIGH (ref 0–99)
Triglycerides: 142 mg/dL (ref 0–149)
VLDL Cholesterol Cal: 28 mg/dL (ref 5–40)

## 2015-05-25 NOTE — Progress Notes (Signed)
Mailed results.[facility]

## 2015-07-02 ENCOUNTER — Ambulatory Visit: Payer: Self-pay | Admitting: Family Medicine

## 2015-08-27 ENCOUNTER — Ambulatory Visit: Payer: 59 | Admitting: Family Medicine

## 2015-10-06 ENCOUNTER — Encounter: Payer: Self-pay | Admitting: Gastroenterology

## 2015-10-06 ENCOUNTER — Ambulatory Visit (INDEPENDENT_AMBULATORY_CARE_PROVIDER_SITE_OTHER): Payer: 59 | Admitting: Gastroenterology

## 2015-10-06 VITALS — BP 142/90 | HR 80 | Temp 98.3°F | Ht 71.0 in | Wt 266.0 lb

## 2015-10-06 DIAGNOSIS — K921 Melena: Secondary | ICD-10-CM

## 2015-10-06 NOTE — Progress Notes (Addendum)
Gastroenterology Consultation  Referring Provider:     Arlis Porta., MD Primary Care Physician:  Dicky Doe, MD Primary Gastroenterologist:  Dr. Allen Norris     Reason for Consultation:     Rectal bleeding and dark stools.        HPI:   Louis Salazar is a 59 y.o. y/o male referred for consultation & management of  Rectal bleeding and dark stools by Dr. Dicky Doe, MD.   This patient comes in today with a report of intermittent rectal bleeding. The patient also dates that he has multiple bowel movements throughout the day and as the day progresses his bowel movements become very dark. He denies any certain foods will make his stools any lighter or darker and he is not sure what is making his stool start. He is also concerned about the rectal bleeding. He denies any unexplained weight loss and states that he is actually gaining weight. There is no report of any nausea or vomiting. He has been in his usual state of health except for the symptoms.    Past Surgical History  Procedure Laterality Date  . Abdominal surgery    . Hernia repair    . Wrist surgery Right     shoulder    Prior to Admission medications   Medication Sig Start Date End Date Taking? Authorizing Provider  ANDRODERM 4 MG/24HR PT24 patch  09/22/15  Yes Historical Provider, MD  artificial tears (LACRILUBE) OINT ophthalmic ointment Place into the left eye 4 (four) times daily. 06/08/14  Yes Sherwood Gambler, MD  aspirin EC 325 MG tablet Take 650 mg by mouth every 6 (six) hours as needed for mild pain.   Yes Historical Provider, MD  clomiPHENE (CLOMID) 50 MG tablet  08/29/15  Yes Historical Provider, MD  finasteride (PROSCAR) 5 MG tablet Take 5 mg by mouth daily with lunch.    Yes Historical Provider, MD  ibuprofen (ADVIL,MOTRIN) 200 MG tablet Take 400 mg by mouth every 6 (six) hours as needed for mild pain.   Yes Historical Provider, MD  metoprolol succinate (TOPROL-XL) 50 MG 24 hr tablet Take 75 mg by mouth  every morning. Take with or immediately following a meal.   Yes Historical Provider, MD  sertraline (ZOLOFT) 50 MG tablet Take 50 mg by mouth daily.   Yes Historical Provider, MD  tamsulosin (FLOMAX) 0.4 MG CAPS capsule Take 0.4 mg by mouth daily with lunch.    Yes Historical Provider, MD  traMADol (ULTRAM) 50 MG tablet Take 50 mg by mouth daily. Takes 150mg  in the morning and 100mg  at lunch time    Historical Provider, MD  zolpidem (AMBIEN) 5 MG tablet Take 1 tablet every other n\ight for 1 month, then 1/2 every other  night. Patient not taking: Reported on 10/06/2015 05/24/15   Arlis Porta., MD    Family History  Problem Relation Age of Onset  . Cancer Other   . COPD Mother   . Arthritis Maternal Grandmother   . Cancer Maternal Grandmother   . Diabetes Maternal Grandmother      Social History  Substance Use Topics  . Smoking status: Former Smoker -- 30 years  . Smokeless tobacco: Current User    Types: Chew  . Alcohol Use: 1.8 oz/week    3 Cans of beer per week    Allergies as of 10/06/2015 - Review Complete 05/24/2015  Allergen Reaction Noted  . Sulfa antibiotics Other (See Comments) 02/02/2014  .  Celebrex [celecoxib] Hives and Rash 02/02/2014    Review of Systems:    All systems reviewed and negative except where noted in HPI.   Physical Exam:  BP 142/90 mmHg  Pulse 80  Temp(Src) 98.3 F (36.8 C) (Oral)  Ht 5\' 11"  (1.803 m)  Wt 266 lb (120.657 kg)  BMI 37.12 kg/m2 No LMP for male patient. Psych:  Alert and cooperative. Normal mood and affect. General:   Alert,  Well-developed, well-nourished, pleasant and cooperative in NAD Head:  Normocephalic and atraumatic. Eyes:  Sclera clear, no icterus.   Conjunctiva pink. Ears:  Normal auditory acuity. Nose:  No deformity, discharge, or lesions. Mouth:  No deformity or lesions,oropharynx pink & moist. Neck:  Supple; no masses or thyromegaly. Lungs:  Respirations even and unlabored.  Clear throughout to auscultation.    No wheezes, crackles, or rhonchi. No acute distress. Heart:  Regular rate and rhythm; no murmurs, clicks, rubs, or gallops. Abdomen:  Normal bowel sounds.  No bruits.  Soft, non-tender and non-distended without masses, hepatosplenomegaly or hernias noted.  No guarding or rebound tenderness.  Negative Carnett sign.   Rectal:  Deferred.  Msk:  Symmetrical without gross deformities.  Good, equal movement & strength bilaterally. Pulses:  Normal pulses noted. Extremities:  No clubbing or edema.  No cyanosis. Neurologic:  Alert and oriented x3;  grossly normal neurologically. Skin:  Intact without significant lesions or rashes.  No jaundice. Lymph Nodes:  No significant cervical adenopathy. Psych:  Alert and cooperative. Normal mood and affect.  Imaging Studies: No results found.  Assessment and Plan:   Louis Salazar is a 59 y.o. y/o male Who comes in with a report of dark stools and rectal bleeding. The patient states he has had a change in his bowel habits with multiple bowel movements throughout the day although no frank diarrhea. The patient will be set up for an EGD and colonoscopy due to his dark stools and his rectal bleeding. The patient has been explained the plan and agrees with it. I have discussed risks & benefits which include, but are not limited to, bleeding, infection, perforation & drug reaction.  The patient agrees with this plan & written consent will be obtained.      Note: This dictation was prepared with Dragon dictation along with smaller phrase technology. Any transcriptional errors that result from this process are unintentional.

## 2015-10-11 ENCOUNTER — Other Ambulatory Visit: Payer: Self-pay

## 2015-10-14 NOTE — Discharge Instructions (Signed)

## 2015-10-18 ENCOUNTER — Other Ambulatory Visit: Payer: Self-pay | Admitting: Gastroenterology

## 2015-10-18 ENCOUNTER — Ambulatory Visit: Payer: 59 | Admitting: Anesthesiology

## 2015-10-18 ENCOUNTER — Encounter: Payer: Self-pay | Admitting: *Deleted

## 2015-10-18 ENCOUNTER — Encounter
Admission: RE | Disposition: A | Discharge status: Home / Self Care | Payer: Self-pay | Source: Ambulatory Visit | Attending: Gastroenterology

## 2015-10-18 ENCOUNTER — Ambulatory Visit
Admission: RE | Admit: 2015-10-18 | Discharge: 2015-10-18 | Disposition: A | Discharge status: Home / Self Care | Payer: 59 | Source: Ambulatory Visit | Attending: Gastroenterology | Admitting: Gastroenterology

## 2015-10-18 DIAGNOSIS — M542 Cervicalgia: Secondary | ICD-10-CM | POA: Insufficient documentation

## 2015-10-18 DIAGNOSIS — Z882 Allergy status to sulfonamides status: Secondary | ICD-10-CM | POA: Insufficient documentation

## 2015-10-18 DIAGNOSIS — Z6836 Body mass index (BMI) 36.0-36.9, adult: Secondary | ICD-10-CM | POA: Diagnosis not present

## 2015-10-18 DIAGNOSIS — K921 Melena: Secondary | ICD-10-CM | POA: Insufficient documentation

## 2015-10-18 DIAGNOSIS — I10 Essential (primary) hypertension: Secondary | ICD-10-CM | POA: Diagnosis not present

## 2015-10-18 DIAGNOSIS — K122 Cellulitis and abscess of mouth: Secondary | ICD-10-CM | POA: Diagnosis not present

## 2015-10-18 DIAGNOSIS — K2970 Gastritis, unspecified, without bleeding: Secondary | ICD-10-CM | POA: Diagnosis not present

## 2015-10-18 DIAGNOSIS — K228 Other specified diseases of esophagus: Secondary | ICD-10-CM | POA: Insufficient documentation

## 2015-10-18 DIAGNOSIS — K5730 Diverticulosis of large intestine without perforation or abscess without bleeding: Secondary | ICD-10-CM | POA: Diagnosis not present

## 2015-10-18 DIAGNOSIS — K219 Gastro-esophageal reflux disease without esophagitis: Secondary | ICD-10-CM | POA: Insufficient documentation

## 2015-10-18 DIAGNOSIS — Z79899 Other long term (current) drug therapy: Secondary | ICD-10-CM | POA: Insufficient documentation

## 2015-10-18 DIAGNOSIS — Z833 Family history of diabetes mellitus: Secondary | ICD-10-CM | POA: Diagnosis not present

## 2015-10-18 DIAGNOSIS — G7089 Other specified myoneural disorders: Secondary | ICD-10-CM | POA: Diagnosis not present

## 2015-10-18 DIAGNOSIS — M1990 Unspecified osteoarthritis, unspecified site: Secondary | ICD-10-CM | POA: Diagnosis not present

## 2015-10-18 DIAGNOSIS — R0602 Shortness of breath: Secondary | ICD-10-CM | POA: Insufficient documentation

## 2015-10-18 DIAGNOSIS — Z888 Allergy status to other drugs, medicaments and biological substances status: Secondary | ICD-10-CM | POA: Diagnosis not present

## 2015-10-18 DIAGNOSIS — Z809 Family history of malignant neoplasm, unspecified: Secondary | ICD-10-CM | POA: Insufficient documentation

## 2015-10-18 DIAGNOSIS — K641 Second degree hemorrhoids: Secondary | ICD-10-CM | POA: Diagnosis not present

## 2015-10-18 DIAGNOSIS — Z825 Family history of asthma and other chronic lower respiratory diseases: Secondary | ICD-10-CM | POA: Insufficient documentation

## 2015-10-18 DIAGNOSIS — K2289 Other specified disease of esophagus: Secondary | ICD-10-CM | POA: Insufficient documentation

## 2015-10-18 DIAGNOSIS — D122 Benign neoplasm of ascending colon: Secondary | ICD-10-CM | POA: Insufficient documentation

## 2015-10-18 DIAGNOSIS — Z7982 Long term (current) use of aspirin: Secondary | ICD-10-CM | POA: Insufficient documentation

## 2015-10-18 DIAGNOSIS — D120 Benign neoplasm of cecum: Secondary | ICD-10-CM | POA: Insufficient documentation

## 2015-10-18 DIAGNOSIS — D125 Benign neoplasm of sigmoid colon: Secondary | ICD-10-CM | POA: Insufficient documentation

## 2015-10-18 DIAGNOSIS — Z87891 Personal history of nicotine dependence: Secondary | ICD-10-CM | POA: Insufficient documentation

## 2015-10-18 HISTORY — DX: Myoneural disorder, unspecified: G70.9

## 2015-10-18 HISTORY — DX: Cervicalgia: M54.2

## 2015-10-18 HISTORY — PX: COLONOSCOPY WITH PROPOFOL: SHX5780

## 2015-10-18 HISTORY — DX: Gastro-esophageal reflux disease without esophagitis: K21.9

## 2015-10-18 HISTORY — PX: ESOPHAGOGASTRODUODENOSCOPY (EGD) WITH PROPOFOL: SHX5813

## 2015-10-18 SURGERY — ESOPHAGOGASTRODUODENOSCOPY (EGD) WITH PROPOFOL
Anesthesia: Monitor Anesthesia Care | Wound class: Clean Contaminated

## 2015-10-18 MED ORDER — PROPOFOL 10 MG/ML IV BOLUS
INTRAVENOUS | Status: DC | PRN
  Administered 2015-10-18: 50 mg via INTRAVENOUS
  Administered 2015-10-18: 20 mg via INTRAVENOUS
  Administered 2015-10-18: 50 mg via INTRAVENOUS
  Administered 2015-10-18 (×3): 20 mg via INTRAVENOUS
  Administered 2015-10-18: 100 mg via INTRAVENOUS
  Administered 2015-10-18: 40 mg via INTRAVENOUS
  Administered 2015-10-18: 50 mg via INTRAVENOUS

## 2015-10-18 MED ORDER — LACTATED RINGERS IV SOLN
INTRAVENOUS | Status: DC
  Administered 2015-10-18 (×2): via INTRAVENOUS

## 2015-10-18 MED ORDER — ACETAMINOPHEN 325 MG PO TABS: 325.0000 mg | ORAL_TABLET | ORAL | Status: DC | PRN

## 2015-10-18 MED ORDER — LIDOCAINE HCL (CARDIAC) 20 MG/ML IV SOLN
INTRAVENOUS | Status: DC | PRN
  Administered 2015-10-18: 20 mg via INTRAVENOUS

## 2015-10-18 MED ORDER — ACETAMINOPHEN 160 MG/5ML PO SOLN: 325.0000 mg | ORAL | Status: DC | PRN

## 2015-10-18 MED ORDER — GLYCOPYRROLATE 0.2 MG/ML IJ SOLN
INTRAMUSCULAR | Status: DC | PRN
  Administered 2015-10-18: 0.2 mg via INTRAVENOUS

## 2015-10-18 MED ORDER — STERILE WATER FOR IRRIGATION IR SOLN
Status: DC | PRN
  Administered 2015-10-18: 200 mL

## 2015-10-18 SURGICAL SUPPLY — 39 items
BALLN DILATOR 10-12 8 (BALLOONS)
BALLN DILATOR 12-15 8 (BALLOONS)
BALLN DILATOR 15-18 8 (BALLOONS)
BALLN DILATOR CRE 0-12 8 (BALLOONS)
BALLN DILATOR ESOPH 8 10 CRE (MISCELLANEOUS) IMPLANT
BALLOON DILATOR 12-15 8 (BALLOONS) IMPLANT
BALLOON DILATOR 15-18 8 (BALLOONS) IMPLANT
BALLOON DILATOR CRE 0-12 8 (BALLOONS) IMPLANT
BLOCK BITE 60FR ADLT L/F GRN (MISCELLANEOUS) ×3 IMPLANT
CANISTER SUCT 1200ML W/VALVE (MISCELLANEOUS) ×3 IMPLANT
FCP ESCP3.2XJMB 240X2.8X (MISCELLANEOUS)
FORCEPS BIOP RAD 4 LRG CAP 4 (CUTTING FORCEPS) ×2 IMPLANT
FORCEPS BIOP RJ4 240 W/NDL (MISCELLANEOUS)
FORCEPS ESCP3.2XJMB 240X2.8X (MISCELLANEOUS) IMPLANT
GOWN CVR UNV OPN BCK APRN NK (MISCELLANEOUS) ×2 IMPLANT
GOWN ISOL THUMB LOOP REG UNIV (MISCELLANEOUS) ×6
HEMOCLIP INSTINCT (CLIP) IMPLANT
INJECTOR VARIJECT VIN23 (MISCELLANEOUS) IMPLANT
KIT CO2 TUBING (TUBING) ×2 IMPLANT
KIT DEFENDO VALVE AND CONN (KITS) IMPLANT
KIT ENDO PROCEDURE OLY (KITS) ×3 IMPLANT
LIGATOR MULTIBAND 6SHOOTER MBL (MISCELLANEOUS) IMPLANT
MARKER SPOT ENDO TATTOO 5ML (MISCELLANEOUS) IMPLANT
PAD GROUND ADULT SPLIT (MISCELLANEOUS) IMPLANT
SNARE SHORT THROW 13M SML OVAL (MISCELLANEOUS) IMPLANT
SNARE SHORT THROW 30M LRG OVAL (MISCELLANEOUS) IMPLANT
SPOT EX ENDOSCOPIC TATTOO (MISCELLANEOUS)
SUCTION POLY TRAP 4CHAMBER (MISCELLANEOUS) IMPLANT
SYR INFLATION 60ML (SYRINGE) IMPLANT
TRAP SUCTION POLY (MISCELLANEOUS) IMPLANT
TUBING CONN 6MMX3.1M (TUBING)
TUBING SUCTION CONN 0.25 STRL (TUBING) IMPLANT
UNDERPAD 30X60 958B10 (PK) (MISCELLANEOUS) IMPLANT
VALVE BIOPSY ENDO (VALVE) IMPLANT
VARIJECT INJECTOR VIN23 (MISCELLANEOUS)
WATER AUXILLARY (MISCELLANEOUS) IMPLANT
WATER STERILE IRR 250ML POUR (IV SOLUTION) ×3 IMPLANT
WATER STERILE IRR 500ML POUR (IV SOLUTION) IMPLANT
WIRE CRE 18-20MM 8CM F G (MISCELLANEOUS) IMPLANT

## 2015-10-18 NOTE — Op Note (Signed)
[facility] Patient Name: Louis Salazar Procedure Date: 10/18/2015 7:44 AM MRN: [id number] Account #: [id number] Date of Birth: [birth year] Admit Type: Outpatient Age: 59 Room: [facility] OR ROOM 01 Gender: Male Note Status: Finalized Procedure:         Colonoscopy Indications:       Hematochezia Providers:         Lucilla Lame, MD Referring MD:      Arlis Porta, MD (Referring MD) Medicines:         Propofol per Anesthesia Complications:     No immediate complications. Procedure:         Pre-Anesthesia Assessment:                    - Prior to the procedure, a History and Physical was                     performed, and patient medications and allergies were                     reviewed. The patient's tolerance of previous anesthesia                     was also reviewed. The risks and benefits of the procedure                     and the sedation options and risks were discussed with the                     patient. All questions were answered, and informed consent                     was obtained. Prior Anticoagulants: The patient has taken                     no previous anticoagulant or antiplatelet agents. ASA                     Grade Assessment: II - A patient with mild systemic                     disease. After reviewing the risks and benefits, the                     patient was deemed in satisfactory condition to undergo                     the procedure.                    After obtaining informed consent, the colonoscope was                     passed under direct vision. Throughout the procedure, the                     patient's blood pressure, pulse, and oxygen saturations                     were monitored continuously. The Olympus CF H180AL                     colonoscope (S#: [id number]) was introduced through the anus  and advanced to the the cecum, identified by appendiceal                     orifice and  ileocecal valve. The colonoscopy was performed                     without difficulty. The patient tolerated the procedure                     well. The quality of the bowel preparation was excellent. Findings:      The perianal and digital rectal examinations were normal.      A 3 mm polyp was found in the cecum. The polyp was sessile. The polyp       was removed with a cold biopsy forceps. Resection and retrieval were       complete.      A 5 mm polyp was found in the ascending colon. The polyp was sessile.       The polyp was removed with a cold biopsy forceps. Resection and       retrieval were complete.      Two sessile polyps were found in the sigmoid colon. The polyps were 4 to       5 mm in size. These polyps were removed with a cold biopsy forceps.       Resection and retrieval were complete.      A few small-mouthed diverticula were found in the entire colon.      Non-bleeding internal hemorrhoids were found during retroflexion. The       hemorrhoids were Grade II (internal hemorrhoids that prolapse but reduce       spontaneously). Impression:        - One 3 mm polyp in the cecum. Resected and retrieved.                    - One 5 mm polyp in the ascending colon. Resected and                     retrieved.                    - Two 4 to 5 mm polyps in the sigmoid colon. Resected and                     retrieved.                    - Diverticulosis in the entire examined colon.                    - Non-bleeding internal hemorrhoids. Recommendation:    - Await pathology results.                    - Repeat colonoscopy in 5 years if polyp adenoma and 10                     years if hyperplastic Procedure Code(s): --- Professional ---                    [phone], Colonoscopy, flexible; with biopsy, single or                     multiple Diagnosis Code(s): --- Professional ---  K92.1, Melena                    D12.0, Benign neoplasm of cecum                     D12.2, Benign neoplasm of ascending colon                    D12.5, Benign neoplasm of sigmoid colon CPT copyright 2014 American Medical Association. All rights reserved. The codes documented in this report are preliminary and upon coder review may  be revised to meet current compliance requirements. Lucilla Lame, MD 10/18/2015 8:23:08 AM This report has been signed electronically. Number of Addenda: 0 Note Initiated On: 10/18/2015 7:44 AM Scope Withdrawal Time: 0 hours 8 minutes 39 seconds  Total Procedure Duration: 0 hours 10 minutes 31 seconds       [facility]

## 2015-10-18 NOTE — Anesthesia Postprocedure Evaluation (Addendum)
Anesthesia Post Note  Patient: Louis Salazar  Procedure(s) Performed: Procedure(s) (LRB): ESOPHAGOGASTRODUODENOSCOPY (EGD) WITH PROPOFOL (N/A) COLONOSCOPY WITH PROPOFOL (N/A)  Patient location during evaluation: PACU Anesthesia Type: MAC Level of consciousness: awake and alert and oriented Pain management: satisfactory to patient Vital Signs Assessment: post-procedure vital signs reviewed and stable Respiratory status: spontaneous breathing, nonlabored ventilation and respiratory function stable Cardiovascular status: blood pressure returned to baseline and stable Postop Assessment: Adequate PO intake and No signs of nausea or vomiting Anesthetic complications: no    Last Vitals:  Filed Vitals:   10/18/15 0741 10/18/15 0825  BP: 132/103   Pulse: 111   Temp: 36.6 C 36.6 C  Resp: 18     Last Pain:  Filed Vitals:   10/18/15 0826  PainSc: 4                  Raliegh Ip

## 2015-10-18 NOTE — Anesthesia Preprocedure Evaluation (Signed)
Anesthesia Evaluation  Patient identified by MRN, date of birth, ID band  Reviewed: Allergy & Precautions, H&P , NPO status , Patient's Chart, lab work & pertinent test results  Airway Mallampati: II  TM Distance: >3 FB Neck ROM: full    Dental no notable dental hx.    Pulmonary former smoker,    Pulmonary exam normal        Cardiovascular hypertension,  Rhythm:regular Rate:Normal     Neuro/Psych    GI/Hepatic GERD  ,  Endo/Other  Morbid obesity  Renal/GU      Musculoskeletal   Abdominal   Peds  Hematology   Anesthesia Other Findings   Reproductive/Obstetrics                             Anesthesia Physical Anesthesia Plan  ASA: II  Anesthesia Plan: MAC   Post-op Pain Management:    Induction:   Airway Management Planned:   Additional Equipment:   Intra-op Plan:   Post-operative Plan:   Informed Consent: I have reviewed the patients History and Physical, chart, labs and discussed the procedure including the risks, benefits and alternatives for the proposed anesthesia with the patient or authorized representative who has indicated his/her understanding and acceptance.     Plan Discussed with: CRNA  Anesthesia Plan Comments:         Anesthesia Quick Evaluation

## 2015-10-18 NOTE — Anesthesia Procedure Notes (Signed)
Procedure Name: MAC Performed by: Anjali Manzella Pre-anesthesia Checklist: Patient identified, Emergency Drugs available, Suction available, Patient being monitored and Timeout performed Patient Re-evaluated:Patient Re-evaluated prior to inductionOxygen Delivery Method: Nasal cannula Preoxygenation: Pre-oxygenation with 100% oxygen       

## 2015-10-18 NOTE — Transfer of Care (Signed)
Immediate Anesthesia Transfer of Care Note  Patient: Louis Salazar  Procedure(s) Performed: Procedure(s) with comments: ESOPHAGOGASTRODUODENOSCOPY (EGD) WITH PROPOFOL (N/A) - wants as early as possible COLONOSCOPY WITH PROPOFOL (N/A)  Patient Location: PACU  Anesthesia Type: MAC  Level of Consciousness: awake, alert  and patient cooperative  Airway and Oxygen Therapy: Patient Spontanous Breathing and Patient connected to supplemental oxygen  Post-op Assessment: Post-op Vital signs reviewed, Patient's Cardiovascular Status Stable, Respiratory Function Stable, Patent Airway and No signs of Nausea or vomiting  Post-op Vital Signs: Reviewed and stable  Complications: No apparent anesthesia complications

## 2015-10-18 NOTE — Op Note (Signed)
[facility]astroenterology Patient Name: Louis Salazar Procedure Date: 10/18/2015 7:45 AM MRN: [id number] Account #: [id number] Date of Birth: [birth year] Admit Type: Outpatient Age: 59 Room: [facility] OR ROOM 01 Gender: Male Note Status: Finalized Procedure:         Upper GI endoscopy Indications:       Melena Providers:         Lucilla Lame, MD Referring MD:      Arlis Porta, MD (Referring MD) Medicines:         Propofol per Anesthesia Complications:     No immediate complications. Procedure:         Pre-Anesthesia Assessment:                    - Prior to the procedure, a History and Physical was                     performed, and patient medications and allergies were                     reviewed. The patient's tolerance of previous anesthesia                     was also reviewed. The risks and benefits of the procedure                     and the sedation options and risks were discussed with the                     patient. All questions were answered, and informed consent                     was obtained. Prior Anticoagulants: The patient has taken                     no previous anticoagulant or antiplatelet agents. ASA                     Grade Assessment: II - A patient with mild systemic                     disease. After reviewing the risks and benefits, the                     patient was deemed in satisfactory condition to undergo                     the procedure.                    After obtaining informed consent, the endoscope was passed                     under direct vision. Throughout the procedure, the                     patient's blood pressure, pulse, and oxygen saturations                     were monitored continuously. The Olympus GIF H180J                     endoscope (S#: [id number]) was introduced through the mouth,  and advanced to the second part of duodenum. The upper GI                     endoscopy was  accomplished without difficulty. The patient                     tolerated the procedure well. Findings:      The Z-line was irregular and was found at the gastroesophageal junction.      Evidence of a Nissen fundoplication was found in the gastric fundus.       This was characterized by healthy appearing mucosa.      Localized mild inflammation characterized by erythema was found in the       gastric antrum. Biopsies were taken with a cold forceps for histology.      The examined duodenum was normal. Impression:        - Z-line irregular, at the gastroesophageal junction.                    - A Nissen fundoplication was found, characterized by                     healthy appearing mucosa.                    - Gastritis. Biopsied.                    - Normal examined duodenum. Recommendation:    - Await pathology results.                    - Perform a colonoscopy today. Procedure Code(s): --- Professional ---                    [phone], Esophagogastroduodenoscopy, flexible, transoral;                     with biopsy, single or multiple Diagnosis Code(s): --- Professional ---                    K92.1, Melena                    K22.8, Other specified diseases of esophagus                    Z98.89, Other specified postprocedural states                    K29.70, Gastritis, unspecified, without bleeding CPT copyright 2014 American Medical Association. All rights reserved. The codes documented in this report are preliminary and upon coder review may  be revised to meet current compliance requirements. Lucilla Lame, MD 10/18/2015 8:07:02 AM This report has been signed electronically. Number of Addenda: 0 Note Initiated On: 10/18/2015 7:45 AM Total Procedure Duration: 0 hours 1 minute 56 seconds       [facility]

## 2015-10-18 NOTE — H&P (Addendum)
[facility]  [address] [place], [place] [address] Phone: [phone] Fax : [phone]  Primary Care Physician:  Dicky Doe, MD Primary Gastroenterologist:  Dr. Allen Norris  Pre-Procedure History & Physical: HPI:  Louis Salazar is a 59 y.o. male is here for an colonoscopy and EGD.   Past Medical History  Diagnosis Date  . Substance abuse     hx of inhalants, last use 2010  . Hypertension     controlled on med  . Shortness of breath dyspnea   . Neuromuscular disorder ([place])     hands are tingly and go numb  . Arthritis     right shoulder  . GERD (gastroesophageal reflux disease)   . Neck pain     "EXPERIENCE CRICK IN NECK"    Past Surgical History  Procedure Laterality Date  . Abdominal surgery    . Hernia repair    . Wrist surgery Right     shoulder  . Shoulder surgery      x2    Prior to Admission medications   Medication Sig Start Date End Date Taking? Authorizing Provider  ANDRODERM 4 MG/24HR PT24 patch 1 patch. Twice a week 09/22/15  Yes Historical Provider, MD  aspirin EC 325 MG tablet Take 650 mg by mouth every 6 (six) hours as needed for mild pain.   Yes Historical Provider, MD  clomiPHENE (CLOMID) 50 MG tablet 25 mg. am 08/29/15  Yes Historical Provider, MD  finasteride (PROSCAR) 5 MG tablet Take 5 mg by mouth daily with lunch.    Yes Historical Provider, MD  ibuprofen (ADVIL,MOTRIN) 200 MG tablet Take 400 mg by mouth every 6 (six) hours as needed for mild pain.   Yes Historical Provider, MD  metoprolol succinate (TOPROL-XL) 50 MG 24 hr tablet Take 75 mg by mouth every morning. Take with or immediately following a meal.   Yes Historical Provider, MD  Multiple Vitamins-Minerals (CENTRUM SILVER PO) Take by mouth.   Yes Historical Provider, MD  Omega-3 Fatty Acids (FISH OIL CONCENTRATE PO) Take by mouth. Daily   Yes Historical Provider, MD  sertraline (ZOLOFT) 50 MG tablet Take 25 mg by mouth daily.    Yes Historical Provider, MD    tamsulosin (FLOMAX) 0.4 MG CAPS capsule Take 0.4 mg by mouth daily with lunch.    Yes Historical Provider, MD  artificial tears (LACRILUBE) OINT ophthalmic ointment Place into the left eye 4 (four) times daily. Patient not taking: Reported on 10/12/2015 06/08/14   Sherwood Gambler, MD  traMADol (ULTRAM) 50 MG tablet Take 50 mg by mouth daily. Takes 150mg  in the morning and 100mg  at lunch time    Historical Provider, MD  zolpidem (AMBIEN) 5 MG tablet Take 1 tablet every other n\ight for 1 month, then 1/2 every other  night. Patient not taking: Reported on 10/06/2015 05/24/15   Arlis Porta., MD    Allergies as of 10/11/2015 - Review Complete 05/24/2015  Allergen Reaction Noted  . Sulfa antibiotics Other (See Comments) 02/02/2014  . Celebrex [celecoxib] Hives and Rash 02/02/2014    Family History  Problem Relation Age of Onset  . Cancer Other   . COPD Mother   . Arthritis Maternal Grandmother   . Cancer Maternal Grandmother   . Diabetes Maternal Grandmother     Social History   Social History  . Marital Status: Divorced    Spouse Name: N/A  . Number of Children: N/A  . Years of Education: N/A  Occupational History  . Not on file.   Social History Main Topics  . Smoking status: Former Smoker -- 1.00 packs/day for 30 years    Types: Cigarettes  . Smokeless tobacco: Current User    Types: Chew  . Alcohol Use: 1.8 oz/week    3 Cans of beer per week  . Drug Use: No  . Sexual Activity: Not on file   Other Topics Concern  . Not on file   Social History Narrative    Review of Systems: See HPI, otherwise negative ROS  Physical Exam: There were no vitals taken for this visit. General:   Alert,  pleasant and cooperative in NAD Head:  Normocephalic and atraumatic. Neck:  Supple; no masses or thyromegaly. Lungs:  Clear throughout to auscultation.    Heart:  Regular rate and rhythm. Abdomen:  Soft, nontender and nondistended. Normal bowel sounds, without guarding, and  without rebound.   Neurologic:  Alert and  oriented x4;  grossly normal neurologically.  Impression/Plan: Louis Salazar is here for an colonoscopy and EGD to be performed for hematochezia and melena  Risks, benefits, limitations, and alternatives regarding  colonoscopy and EGD have been reviewed with the patient.  Questions have been answered.  All parties agreeable.   Louis Bowl, MD  10/18/2015, 7:33 AM

## 2015-10-18 NOTE — Addendum Note (Signed)
Addendum  created 10/18/15 1259 by Ronelle Nigh, MD   Modules edited: Clinical Notes   Clinical Notes:  File: [id number]

## 2015-10-19 ENCOUNTER — Encounter: Payer: Self-pay | Admitting: Gastroenterology

## 2015-10-27 ENCOUNTER — Encounter: Payer: Self-pay | Admitting: Gastroenterology

## 2016-02-14 ENCOUNTER — Encounter: Payer: Self-pay | Admitting: Family Medicine

## 2016-02-14 ENCOUNTER — Ambulatory Visit (INDEPENDENT_AMBULATORY_CARE_PROVIDER_SITE_OTHER): Payer: 59 | Admitting: Family Medicine

## 2016-02-14 ENCOUNTER — Ambulatory Visit
Admission: RE | Admit: 2016-02-14 | Discharge: 2016-02-14 | Disposition: A | Discharge status: Home / Self Care | Payer: 59 | Source: Ambulatory Visit | Attending: Family Medicine | Admitting: Family Medicine

## 2016-02-14 VITALS — BP 130/86 | HR 107 | Temp 98.6°F | Resp 16 | Ht 71.0 in | Wt 269.0 lb

## 2016-02-14 DIAGNOSIS — M7989 Other specified soft tissue disorders: Secondary | ICD-10-CM | POA: Diagnosis not present

## 2016-02-14 DIAGNOSIS — F1010 Alcohol abuse, uncomplicated: Secondary | ICD-10-CM

## 2016-02-14 DIAGNOSIS — R000 Tachycardia, unspecified: Secondary | ICD-10-CM

## 2016-02-14 DIAGNOSIS — K1120 Sialoadenitis, unspecified: Secondary | ICD-10-CM | POA: Diagnosis not present

## 2016-02-14 MED ORDER — AMOXICILLIN-POT CLAVULANATE 875-125 MG PO TABS: 1.0000 | ORAL_TABLET | Freq: Two times a day (BID) | ORAL | Status: DC

## 2016-02-14 NOTE — Progress Notes (Signed)
Name: Louis Salazar   MRN: [id number]    DOB: Apr 06, 1956   Date:02/14/2016       Progress Note  Subjective  Chief Complaint  Chief Complaint  Patient presents with  . Adenopathy    R leg is swollen    HPI  Here c/o bilateral neck lymph node enlargement .  This has bee going on for 2 weeks.   Seem to get bigger then smaller.  No sore throat.  Had some cough about 1 month ago.  No fever.  No other head or neck problems.    C/o L leg swelling for a few months.   Swelling seems to come and go.  Wants something (Tramadol) for shoulder pain and all other medicines refilled and othe problems taken care of.  Has not been in office for 9 months. No problem-specific assessment & plan notes found for this encounter.   Past Medical History  Diagnosis Date  . Substance abuse     hx of inhalants, last use 2010  . Hypertension     controlled on med  . Shortness of breath dyspnea   . Neuromuscular disorder ([place])     hands are tingly and go numb  . Arthritis     right shoulder  . GERD (gastroesophageal reflux disease)   . Neck pain     "EXPERIENCE CRICK IN NECK"    Social History  Substance Use Topics  . Smoking status: Former Smoker -- 1.00 packs/day for 30 years    Types: Cigarettes  . Smokeless tobacco: Current User    Types: Chew  . Alcohol Use: 1.8 oz/week    3 Cans of beer per week     Current outpatient prescriptions:  .  ANDRODERM 4 MG/24HR PT24 patch, 1 patch. Twice a week, Disp: , Rfl: 0 .  aspirin EC 325 MG tablet, Take 650 mg by mouth every 6 (six) hours as needed for mild pain., Disp: , Rfl:  .  clomiPHENE (CLOMID) 50 MG tablet, 25 mg. am, Disp: , Rfl: 0 .  finasteride (PROSCAR) 5 MG tablet, Take 5 mg by mouth daily with lunch. , Disp: , Rfl:  .  metoprolol succinate (TOPROL-XL) 50 MG 24 hr tablet, Take 75 mg by mouth every morning. Take with or immediately following a meal., Disp: , Rfl:  .  Multiple Vitamins-Minerals (CENTRUM SILVER PO), Take by mouth., Disp: ,  Rfl:  .  Omega-3 Fatty Acids (FISH OIL CONCENTRATE PO), Take by mouth. Daily, Disp: , Rfl:  .  tamsulosin (FLOMAX) 0.4 MG CAPS capsule, Take 0.4 mg by mouth daily with lunch. , Disp: , Rfl:  .  sertraline (ZOLOFT) 50 MG tablet, Take 25 mg by mouth daily. Reported on 02/14/2016, Disp: , Rfl:   Allergies  Allergen Reactions  . Sulfa Antibiotics Other (See Comments)    Unknown - childhood allergy  . Celebrex [Celecoxib] Hives and Rash    Review of Systems  Constitutional: Negative for fever, chills, weight loss and malaise/fatigue.  HENT: Negative for hearing loss.   Eyes: Negative for blurred vision and double vision.  Respiratory: Negative for cough, sputum production, shortness of breath and wheezing.   Cardiovascular: Positive for leg swelling (Left). Negative for chest pain and palpitations.  Gastrointestinal: Negative for heartburn, abdominal pain and blood in stool.  Genitourinary: Negative for dysuria, urgency and frequency.  Musculoskeletal: Negative for myalgias and joint pain.  Skin: Negative for rash.  Neurological: Negative for tremors, weakness and headaches.  Objective  Filed Vitals:   02/14/16 1300  BP: 130/86  Pulse: 107  Temp: 98.6 F (37 C)  TempSrc: Oral  Resp: 16  Height: 5\' 11"  (1.803 m)  Weight: 269 lb (122.018 kg)     Physical Exam  Constitutional: He is well-developed, well-nourished, and in no distress. No distress.  HENT:  Head: Normocephalic and atraumatic.  Eyes: Conjunctivae and EOM are normal. Pupils are equal, round, and reactive to light. Scleral icterus is present.  Neck: Normal range of motion. Neck supple. Thyromegaly present.  Bilateral parotid gland enlargement.  Non-tender.  No lymphadeenopathy  Musculoskeletal:  L leg swollen  Compared to R.  Non-tender.  Negative Homan's sign  Lymphadenopathy:    He has cervical adenopathy.  Vitals reviewed.     No results found for this or any previous visit (from the past 2160  hour(s)).   Assessment & Plan

## 2016-02-15 ENCOUNTER — Other Ambulatory Visit: Payer: Self-pay | Admitting: Family Medicine

## 2016-02-15 ENCOUNTER — Telehealth: Payer: Self-pay | Admitting: Family Medicine

## 2016-02-15 NOTE — Telephone Encounter (Signed)
I can send him in some Mobic. 15 mg tabs.  OK to send in 30 of these with 1 refill.   Cannot send in Tramadol.  It is a controlled substance.  Also cannot use Tramadol with your alcohol.-jh

## 2016-02-15 NOTE — Telephone Encounter (Signed)
Pt informed as per Dr. Luan Pulling and [facility] pharmacy and his Rx is ready.

## 2016-02-15 NOTE — Telephone Encounter (Signed)
Pt.called requesting  Tramadol. Pt call back # [phone]

## 2016-02-15 NOTE — Progress Notes (Signed)
Call pharmacy and discontinue this order.-jh

## 2016-02-15 NOTE — Telephone Encounter (Signed)
I was trying to send his Rx  but it's giving me contradiction for celecoxib since pt is allergic to sulfa and Celebrex and I asked pt he had never taken Mobic before and he is asking a Rx for swollen gland too please suggest ?

## 2016-02-15 NOTE — Telephone Encounter (Signed)
E may have to just take Tylenol, 500 mg., 2 tabs up to 3 times a day for pain if he is allergic to Celebrex.  If Pharmacy did not get Presciption for Augmentin from yesterday, can send in Rx for Augmentin, 875 mg., 1 twice a day for 10 days (#20 / 0 refills).  It is to be taken with meals.

## 2016-02-16 LAB — CBC WITH DIFFERENTIAL/PLATELET
BASOS: 0 %
Basophils Absolute: 0 10*3/uL (ref 0.0–0.2)
EOS (ABSOLUTE): 0.2 10*3/uL (ref 0.0–0.4)
Eos: 3 %
Hematocrit: 46.4 % (ref 37.5–51.0)
Hemoglobin: 15.4 g/dL (ref 12.6–17.7)
IMMATURE GRANS (ABS): 0 10*3/uL (ref 0.0–0.1)
Immature Granulocytes: 0 %
LYMPHS ABS: 1.6 10*3/uL (ref 0.7–3.1)
LYMPHS: 26 %
MCH: 28.6 pg (ref 26.6–33.0)
MCHC: 33.2 g/dL (ref 31.5–35.7)
MCV: 86 fL (ref 79–97)
Monocytes Absolute: 0.6 10*3/uL (ref 0.1–0.9)
Monocytes: 9 %
NEUTROS ABS: 3.8 10*3/uL (ref 1.4–7.0)
Neutrophils: 62 %
PLATELETS: 203 10*3/uL (ref 150–379)
RBC: 5.38 x10E6/uL (ref 4.14–5.80)
RDW: 15.4 % (ref 12.3–15.4)
WBC: 6.2 10*3/uL (ref 3.4–10.8)

## 2016-02-16 LAB — COMPREHENSIVE METABOLIC PANEL
A/G RATIO: 1.5 (ref 1.2–2.2)
ALBUMIN: 4 g/dL (ref 3.5–5.5)
ALK PHOS: 51 IU/L (ref 39–117)
ALT: 9 IU/L (ref 0–44)
AST: 17 IU/L (ref 0–40)
BILIRUBIN TOTAL: 0.3 mg/dL (ref 0.0–1.2)
BUN / CREAT RATIO: 25 — AB (ref 9–20)
BUN: 26 mg/dL — ABNORMAL HIGH (ref 6–24)
CHLORIDE: 103 mmol/L (ref 96–106)
CO2: 22 mmol/L (ref 18–29)
Calcium: 8.8 mg/dL (ref 8.7–10.2)
Creatinine, Ser: 1.05 mg/dL (ref 0.76–1.27)
GFR calc non Af Amer: 77 mL/min/{1.73_m2} (ref >59)
GFR, EST AFRICAN AMERICAN: 89 mL/min/{1.73_m2} (ref >59)
Globulin, Total: 2.6 g/dL (ref 1.5–4.5)
Glucose: 110 mg/dL — ABNORMAL HIGH (ref 65–99)
POTASSIUM: 4.8 mmol/L (ref 3.5–5.2)
Sodium: 146 mmol/L — ABNORMAL HIGH (ref 134–144)
TOTAL PROTEIN: 6.6 g/dL (ref 6.0–8.5)

## 2016-02-16 LAB — LIPID PANEL
Chol/HDL Ratio: 4.4 ratio units (ref 0.0–5.0)
Cholesterol, Total: 193 mg/dL (ref 100–199)
HDL: 44 mg/dL (ref >39)
LDL Calculated: 119 mg/dL — ABNORMAL HIGH (ref 0–99)
Triglycerides: 151 mg/dL — ABNORMAL HIGH (ref 0–149)
VLDL Cholesterol Cal: 30 mg/dL (ref 5–40)

## 2016-02-16 LAB — TSH: TSH: 1.7 u[IU]/mL (ref 0.450–4.500)

## 2016-02-25 LAB — SPECIMEN STATUS REPORT

## 2016-02-25 LAB — HGB A1C W/O EAG: HEMOGLOBIN A1C: 6.2 % — AB (ref 4.8–5.6)

## 2016-02-28 ENCOUNTER — Encounter: Payer: Self-pay | Admitting: Family Medicine

## 2016-02-28 ENCOUNTER — Ambulatory Visit (INDEPENDENT_AMBULATORY_CARE_PROVIDER_SITE_OTHER): Payer: 59 | Admitting: Family Medicine

## 2016-02-28 VITALS — BP 111/61 | HR 95 | Resp 16 | Ht 71.0 in | Wt 267.2 lb

## 2016-02-28 DIAGNOSIS — E669 Obesity, unspecified: Secondary | ICD-10-CM | POA: Diagnosis not present

## 2016-02-28 DIAGNOSIS — I10 Essential (primary) hypertension: Secondary | ICD-10-CM

## 2016-02-28 DIAGNOSIS — R000 Tachycardia, unspecified: Secondary | ICD-10-CM | POA: Diagnosis not present

## 2016-02-28 DIAGNOSIS — F1010 Alcohol abuse, uncomplicated: Secondary | ICD-10-CM

## 2016-02-28 DIAGNOSIS — F32A Depression, unspecified: Secondary | ICD-10-CM

## 2016-02-28 DIAGNOSIS — F329 Major depressive disorder, single episode, unspecified: Secondary | ICD-10-CM | POA: Diagnosis not present

## 2016-02-28 MED ORDER — METOPROLOL TARTRATE 25 MG PO TABS: 25.0000 mg | ORAL_TABLET | Freq: Two times a day (BID) | ORAL | Status: DC

## 2016-02-28 MED ORDER — SERTRALINE HCL 50 MG PO TABS: 25.0000 mg | ORAL_TABLET | Freq: Every day | ORAL | Status: DC

## 2016-02-28 NOTE — Progress Notes (Signed)
Name: Louis Salazar   MRN: [id number]    DOB: 29-Jun-1956   Date:02/28/2016       Progress Note  Subjective  Chief Complaint  Chief Complaint  Patient presents with  . Hypertension    HPI Here for f/u of HBP, tachycardia, pre-diabetes, depression  No problem-specific assessment & plan notes found for this encounter.   Past Medical History  Diagnosis Date  . Substance abuse     hx of inhalants, last use 2010  . Hypertension     controlled on med  . Shortness of breath dyspnea   . Neuromuscular disorder ([place])     hands are tingly and go numb  . Arthritis     right shoulder  . GERD (gastroesophageal reflux disease)   . Neck pain     "EXPERIENCE CRICK IN NECK"    Past Surgical History  Procedure Laterality Date  . Abdominal surgery    . Hernia repair    . Wrist surgery Right     shoulder  . Shoulder surgery      x2  . Esophagogastroduodenoscopy (egd) with propofol N/A 10/18/2015    Procedure: ESOPHAGOGASTRODUODENOSCOPY (EGD) WITH PROPOFOL;  Surgeon: Lucilla Lame, MD;  Location: [facility];  Service: Endoscopy;  Laterality: N/A;  wants as early as possible  . Colonoscopy with propofol N/A 10/18/2015    Procedure: COLONOSCOPY WITH PROPOFOL;  Surgeon: Lucilla Lame, MD;  Location: [place] [facility];  Service: Endoscopy;  Laterality: N/A;    Family History  Problem Relation Age of Onset  . Cancer Other   . COPD Mother   . Arthritis Maternal Grandmother   . Cancer Maternal Grandmother   . Diabetes Maternal Grandmother     Social History   Social History  . Marital Status: Divorced    Spouse Name: N/A  . Number of Children: N/A  . Years of Education: N/A   Occupational History  . Not on file.   Social History Main Topics  . Smoking status: Former Smoker -- 1.00 packs/day for 30 years    Types: Cigarettes  . Smokeless tobacco: Current User    Types: Chew  . Alcohol Use: 1.8 oz/week    3 Cans of beer per week  . Drug Use: No  . Sexual Activity:  Not on file   Other Topics Concern  . Not on file   Social History Narrative     Current outpatient prescriptions:  .  ANDRODERM 4 MG/24HR PT24 patch, 1 patch. Twice a week, Disp: , Rfl: 0 .  aspirin EC 325 MG tablet, Take 650 mg by mouth every 6 (six) hours as needed for mild pain., Disp: , Rfl:  .  clomiPHENE (CLOMID) 50 MG tablet, 25 mg. am, Disp: , Rfl: 0 .  finasteride (PROSCAR) 5 MG tablet, Take 5 mg by mouth daily with lunch. , Disp: , Rfl:  .  Multiple Vitamins-Minerals (CENTRUM SILVER PO), Take by mouth., Disp: , Rfl:  .  Omega-3 Fatty Acids (FISH OIL CONCENTRATE PO), Take by mouth. Daily, Disp: , Rfl:  .  sertraline (ZOLOFT) 50 MG tablet, Take 0.5 tablets (25 mg total) by mouth daily. Reported on 02/14/2016, Disp: 30 tablet, Rfl: 3 .  tamsulosin (FLOMAX) 0.4 MG CAPS capsule, Take 0.4 mg by mouth daily with lunch. , Disp: , Rfl:  .  metoprolol tartrate (LOPRESSOR) 25 MG tablet, Take 1 tablet (25 mg total) by mouth 2 (two) times daily., Disp: 180 tablet, Rfl: 3  Allergies  Allergen Reactions  .  Sulfa Antibiotics Other (See Comments)    Unknown - childhood allergy  . Celebrex [Celecoxib] Hives and Rash     Review of Systems  Constitutional: Negative for fever, chills, weight loss and malaise/fatigue.  HENT: Negative for hearing loss.   Eyes: Negative for blurred vision and double vision.  Respiratory: Negative for cough, shortness of breath and wheezing.   Cardiovascular: Negative for chest pain, palpitations and leg swelling.  Gastrointestinal: Negative for heartburn, abdominal pain and blood in stool.  Genitourinary: Negative for dysuria, urgency and frequency.  Skin: Positive for rash.  Neurological: Negative for weakness and headaches.      Objective  Filed Vitals:   02/28/16 1528  BP: 111/61  Pulse: 95  Resp: 16  Height: 5\' 11"  (1.803 m)  Weight: 267 lb 3.2 oz (121.201 kg)    Physical Exam  Constitutional: He is oriented to person, place, and time and  well-developed, well-nourished, and in no distress. No distress.  HENT:  Head: Normocephalic and atraumatic.  Eyes: Conjunctivae and EOM are normal. Pupils are equal, round, and reactive to light. No scleral icterus.  Neck: Normal range of motion. Neck supple. Carotid bruit is not present. No thyromegaly present.  Cardiovascular: Normal rate, regular rhythm and normal heart sounds.  Exam reveals no gallop and no friction rub.   No murmur heard. Pulmonary/Chest: Effort normal. No respiratory distress. He has no wheezes. He has no rales.  Abdominal: Soft. Bowel sounds are normal. He exhibits no distension, no abdominal bruit and no mass. There is no tenderness.  Musculoskeletal: He exhibits no edema.  Lymphadenopathy:    He has no cervical adenopathy.  Neurological: He is alert and oriented to person, place, and time.  Vitals reviewed.      Recent Results (from the past 2160 hour(s))  CBC with Differential     Status: None   Collection Time: 02/15/16  8:44 AM  Result Value Ref Range   WBC 6.2 3.4 - 10.8 x10E3/uL   RBC 5.38 4.14 - 5.80 x10E6/uL   Hemoglobin 15.4 12.6 - 17.7 g/dL   Hematocrit 46.4 37.5 - 51.0 %   MCV 86 79 - 97 fL   MCH 28.6 26.6 - 33.0 pg   MCHC 33.2 31.5 - 35.7 g/dL   RDW 15.4 12.3 - 15.4 %   Platelets 203 150 - 379 x10E3/uL   Neutrophils 62 %   Lymphs 26 %   Monocytes 9 %   Eos 3 %   Basos 0 %   Neutrophils Absolute 3.8 1.4 - 7.0 x10E3/uL   Lymphocytes Absolute 1.6 0.7 - 3.1 x10E3/uL   Monocytes Absolute 0.6 0.1 - 0.9 x10E3/uL   EOS (ABSOLUTE) 0.2 0.0 - 0.4 x10E3/uL   Basophils Absolute 0.0 0.0 - 0.2 x10E3/uL   Immature Granulocytes 0 %   Immature Grans (Abs) 0.0 0.0 - 0.1 x10E3/uL  Comprehensive Metabolic Panel (CMET)     Status: Abnormal   Collection Time: 02/15/16  8:44 AM  Result Value Ref Range   Glucose 110 (H) 65 - 99 mg/dL   BUN 26 (H) 6 - 24 mg/dL   Creatinine, Ser 1.05 0.76 - 1.27 mg/dL   GFR calc non Af Amer 77 >59 mL/min/1.73   GFR calc  Af Amer 89 >59 mL/min/1.73   BUN/Creatinine Ratio 25 (H) 9 - 20   Sodium 146 (H) 134 - 144 mmol/L   Potassium 4.8 3.5 - 5.2 mmol/L   Chloride 103 96 - 106 mmol/L   CO2 22 18 -  29 mmol/L   Calcium 8.8 8.7 - 10.2 mg/dL   Total Protein 6.6 6.0 - 8.5 g/dL   Albumin 4.0 3.5 - 5.5 g/dL   Globulin, Total 2.6 1.5 - 4.5 g/dL   Albumin/Globulin Ratio 1.5 1.2 - 2.2    Comment:               **Please note reference interval change**   Bilirubin Total 0.3 0.0 - 1.2 mg/dL   Alkaline Phosphatase 51 39 - 117 IU/L   AST 17 0 - 40 IU/L   ALT 9 0 - 44 IU/L  Lipid Profile     Status: Abnormal   Collection Time: 02/15/16  8:44 AM  Result Value Ref Range   Cholesterol, Total 193 100 - 199 mg/dL   Triglycerides 151 (H) 0 - 149 mg/dL   HDL 44 >39 mg/dL   VLDL Cholesterol Cal 30 5 - 40 mg/dL   LDL Calculated 119 (H) 0 - 99 mg/dL   Chol/HDL Ratio 4.4 0.0 - 5.0 ratio units    Comment:                                   T. Chol/HDL Ratio                                             Men  Women                               1/2 Avg.Risk  3.4    3.3                                   Avg.Risk  5.0    4.4                                2X Avg.Risk  9.6    7.1                                3X Avg.Risk 23.4   11.0   TSH     Status: None   Collection Time: 02/15/16  8:44 AM  Result Value Ref Range   TSH 1.700 0.450 - 4.500 uIU/mL  Hgb A1c w/o eAG     Status: Abnormal   Collection Time: 02/15/16  8:44 AM  Result Value Ref Range   Hgb A1c MFr Bld 6.2 (H) 4.8 - 5.6 %    Comment:          Pre-diabetes: 5.7 - 6.4          Diabetes: >6.4          Glycemic control for adults with diabetes: <7.0   Specimen status report     Status: None   Collection Time: 02/15/16  8:44 AM  Result Value Ref Range   specimen status report Comment     Comment: Written Authorization Written Authorization Written Authorization Received. Authorization received from [facility] 02-24-2016 Logged by [place]  Problem List Items Addressed This Visit  Cardiovascular and Mediastinum   Essential (primary) hypertension - Primary   Relevant Medications   metoprolol tartrate (LOPRESSOR) 25 MG tablet     Other   Excessive drinking alcohol   Tachycardia   Relevant Medications   metoprolol tartrate (LOPRESSOR) 25 MG tablet   Obesity   Depression   Relevant Medications   sertraline (ZOLOFT) 50 MG tablet      Meds ordered this encounter  Medications  . sertraline (ZOLOFT) 50 MG tablet    Sig: Take 0.5 tablets (25 mg total) by mouth daily. Reported on 02/14/2016    Dispense:  30 tablet    Refill:  3  . metoprolol tartrate (LOPRESSOR) 25 MG tablet    Sig: Take 1 tablet (25 mg total) by mouth 2 (two) times daily.    Dispense:  180 tablet    Refill:  3   1. Essential (primary) hypertension  - metoprolol tartrate (LOPRESSOR) 25 MG tablet; Take 1 tablet (25 mg total) by mouth 2 (two) times daily.  Dispense: 180 tablet; Refill: 3  2. Tachycardia - metoprolol tartrate (LOPRESSOR) 25 MG tablet; Take 1 tablet (25 mg total) by mouth 2 (two) times daily.  Dispense: 180 tablet; Refill: 3  3. Excessive drinking alcohol   4. Obesity   5. Depression  - sertraline (ZOLOFT) 50 MG tablet; Take 0.5 tablets (25 mg total) by mouth daily. Reported on 02/14/2016  Dispense: 30 tablet; Refill: 3

## 2016-03-15 ENCOUNTER — Other Ambulatory Visit: Payer: Self-pay | Admitting: Family Medicine

## 2016-03-15 DIAGNOSIS — I10 Essential (primary) hypertension: Secondary | ICD-10-CM

## 2016-03-15 DIAGNOSIS — R000 Tachycardia, unspecified: Secondary | ICD-10-CM

## 2016-03-15 MED ORDER — METOPROLOL SUCCINATE ER 25 MG PO TB24: 25.0000 mg | ORAL_TABLET | Freq: Two times a day (BID) | ORAL | Status: AC

## 2016-03-15 NOTE — Telephone Encounter (Signed)
Per Dr. Luan Pulling patient should be taking Metoprolol25 mg 1 tab twice daily.

## 2016-04-27 ENCOUNTER — Ambulatory Visit: Payer: 59 | Admitting: Family Medicine

## 2016-04-27 HISTORY — PX: SHOULDER SURGERY: SHX246

## 2016-05-11 ENCOUNTER — Other Ambulatory Visit: Payer: Self-pay | Admitting: Orthopedic Surgery

## 2016-05-11 DIAGNOSIS — M25511 Pain in right shoulder: Secondary | ICD-10-CM

## 2016-05-19 ENCOUNTER — Ambulatory Visit
Admission: RE | Admit: 2016-05-19 | Discharge: 2016-05-19 | Disposition: A | Discharge status: Home / Self Care | Payer: 59 | Source: Ambulatory Visit | Attending: Orthopedic Surgery | Admitting: Orthopedic Surgery

## 2016-05-19 DIAGNOSIS — M25511 Pain in right shoulder: Secondary | ICD-10-CM

## 2016-06-14 ENCOUNTER — Encounter: Payer: Self-pay | Admitting: *Deleted

## 2016-06-14 ENCOUNTER — Emergency Department
Admission: EM | Admit: 2016-06-14 | Discharge: 2016-06-14 | Disposition: A | Discharge status: Home / Self Care | Payer: 59 | Attending: Emergency Medicine | Admitting: Emergency Medicine

## 2016-06-14 DIAGNOSIS — Z79899 Other long term (current) drug therapy: Secondary | ICD-10-CM | POA: Insufficient documentation

## 2016-06-14 DIAGNOSIS — R432 Parageusia: Secondary | ICD-10-CM | POA: Insufficient documentation

## 2016-06-14 DIAGNOSIS — Z87891 Personal history of nicotine dependence: Secondary | ICD-10-CM | POA: Diagnosis not present

## 2016-06-14 DIAGNOSIS — Y829 Unspecified medical devices associated with adverse incidents: Secondary | ICD-10-CM | POA: Insufficient documentation

## 2016-06-14 DIAGNOSIS — T819XXA Unspecified complication of procedure, initial encounter: Secondary | ICD-10-CM | POA: Diagnosis present

## 2016-06-14 DIAGNOSIS — I10 Essential (primary) hypertension: Secondary | ICD-10-CM | POA: Diagnosis not present

## 2016-06-14 DIAGNOSIS — F1910 Other psychoactive substance abuse, uncomplicated: Secondary | ICD-10-CM | POA: Insufficient documentation

## 2016-06-14 DIAGNOSIS — Z7982 Long term (current) use of aspirin: Secondary | ICD-10-CM | POA: Insufficient documentation

## 2016-06-14 DIAGNOSIS — M1990 Unspecified osteoarthritis, unspecified site: Secondary | ICD-10-CM | POA: Diagnosis not present

## 2016-06-14 LAB — CBC
HCT: 39.7 % — ABNORMAL LOW (ref 40.0–52.0)
Hemoglobin: 13.3 g/dL (ref 13.0–18.0)
MCH: 29.1 pg (ref 26.0–34.0)
MCHC: 33.4 g/dL (ref 32.0–36.0)
MCV: 87 fL (ref 80.0–100.0)
PLATELETS: 168 10*3/uL (ref 150–440)
RBC: 4.56 MIL/uL (ref 4.40–5.90)
RDW: 14.7 % — ABNORMAL HIGH (ref 11.5–14.5)
WBC: 11.3 10*3/uL — ABNORMAL HIGH (ref 3.8–10.6)

## 2016-06-14 LAB — COMPREHENSIVE METABOLIC PANEL
ALT: 7 U/L — ABNORMAL LOW (ref 17–63)
ANION GAP: 8 (ref 5–15)
AST: 17 U/L (ref 15–41)
Albumin: 3.5 g/dL (ref 3.5–5.0)
Alkaline Phosphatase: 45 U/L (ref 38–126)
BUN: 19 mg/dL (ref 6–20)
CALCIUM: 8.5 mg/dL — AB (ref 8.9–10.3)
CHLORIDE: 99 mmol/L — AB (ref 101–111)
CO2: 26 mmol/L (ref 22–32)
Creatinine, Ser: 0.78 mg/dL (ref 0.61–1.24)
GFR calc non Af Amer: >60 mL/min (ref >60)
Glucose, Bld: 99 mg/dL (ref 65–99)
Potassium: 4.1 mmol/L (ref 3.5–5.1)
SODIUM: 133 mmol/L — AB (ref 135–145)
Total Bilirubin: 0.9 mg/dL (ref 0.3–1.2)
Total Protein: 6.8 g/dL (ref 6.5–8.1)

## 2016-06-14 NOTE — ED Notes (Signed)
Pt here after tasting metal in mouth after nerve block. Pt was getting ropivacaine HCL but has since clamped. [facility] called and was afraid of toxicity.

## 2016-06-14 NOTE — Discharge Instructions (Signed)
If you have any new or worsening symptoms, please return to this ER or see your surgeon in follow up. Advise discussing continuing use of pump with your surgeon.

## 2016-06-14 NOTE — ED Provider Notes (Signed)
EKG interpreted by me  Date: 06/14/2016  Rate: 94  Rhythm: normal sinus rhythm  QRS Axis: normal  Intervals: normal  ST/T Wave abnormalities: normal  Conduction Disutrbances: none  Narrative Interpretation: unremarkable. No findings to suggest toxicity from ion channel blockade      Carrie Mew, MD 06/14/16 1815

## 2016-06-14 NOTE — ED Notes (Addendum)
Pt to ED with portable neuro block after having shoulder surgery Monday and has started to have c/o metalic taste. Was told from [facility] to be seen in ED in case of toxicity.

## 2016-06-14 NOTE — ED Provider Notes (Signed)
[facility] Emergency Department Provider Note  ____________________________________________  Time seen: Approximately 6:34 PM  I have reviewed the triage vital signs and the nursing notes.   HISTORY  Chief Complaint Post-op Problem    HPI Louis Salazar is a 60 y.o. male, NAD, presents to the emergency department for evaluation of metallic taste in his mouth and ringing in his ears. Patient had right shoulder surgery on Monday and was discharged from [place] today (06/14/16). Patient returned home around 1 pm and started to note a metallic taste in his mouth so he turned off his nerve block pump. Patient fell asleep for a few hours and woke up with ringing in his ears and metallic taste still in his mouth. He called the number he was given if he started to have symptoms of toxicity. [facility] encouraged him to visit his nearest ER. Patient states that the metallic taste has resolved and the ringing in his ears only comes and goes. Patient denies any constitutional symptoms including fever, chills, sweats, or headache. Has not had any chest pain, shortness of breath, visual changes, numbness, weakness, tingling. Pain is currently 1/10.   Past Medical History  Diagnosis Date  . Substance abuse     hx of inhalants, last use 2010  . Hypertension     controlled on med  . Shortness of breath dyspnea   . Neuromuscular disorder ([place])     hands are tingly and go numb  . Arthritis     right shoulder  . GERD (gastroesophageal reflux disease)   . Neck pain     "EXPERIENCE CRICK IN NECK"    Patient Active Problem List   Diagnosis Date Noted  . Obesity 02/28/2016  . Depression 02/28/2016  . Parotiditis 02/14/2016  . Left leg swelling 02/14/2016  . Excessive drinking alcohol 02/14/2016  . Tachycardia 02/14/2016  . Blood in stool   . Other specified diseases of esophagus   . Gastritis   . Benign neoplasm of cecum   . Cellulitis and abscess of oral soft tissues   .  Benign neoplasm of sigmoid colon   . 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 Essential (primary) hypertension 05/24/2015  . Brash 05/24/2015  . History of colon polyps 05/24/2015  . Arthralgia of shoulder 05/24/2015  . Insomnia 05/24/2015  . Primary osteoarthritis of both shoulders 04/27/2015  . Carpal tunnel syndrome 04/27/2015    Past Surgical History  Procedure Laterality Date  . Abdominal surgery    . Hernia repair    . Wrist surgery Right     shoulder  . Shoulder surgery      x2  . Esophagogastroduodenoscopy (egd) with propofol N/A 10/18/2015    Procedure: ESOPHAGOGASTRODUODENOSCOPY (EGD) WITH PROPOFOL;  Surgeon: Lucilla Lame, MD;  Location: [place];  Service: Endoscopy;  Laterality: N/A;  wants as early as possible  . Colonoscopy with propofol N/A 10/18/2015    Procedure: COLONOSCOPY WITH PROPOFOL;  Surgeon: Lucilla Lame, MD;  Location: [facility];  Service: Endoscopy;  Laterality: N/A;    Current Outpatient Rx  Name  Route  Sig  Dispense  Refill  . ANDRODERM 4 MG/24HR PT24 patch      1 patch. Twice a week      0     Dispense as written.   Marland Kitchen aspirin EC 325 MG tablet   Oral   Take 650 mg by mouth every 6 (six) hours as needed for mild pain.         Marland Kitchen  clomiPHENE (CLOMID) 50 MG tablet      25 mg. am      0   . finasteride (PROSCAR) 5 MG tablet   Oral   Take 5 mg by mouth daily with lunch.          . metoprolol succinate (TOPROL-XL) 25 MG 24 hr tablet   Oral   Take 1 tablet (25 mg total) by mouth 2 (two) times daily.   90 tablet   prn   . Multiple Vitamins-Minerals (CENTRUM SILVER PO)   Oral   Take by mouth.         . Omega-3 Fatty Acids (FISH OIL CONCENTRATE PO)   Oral   Take by mouth. Daily         . sertraline (ZOLOFT) 50 MG tablet   Oral   Take 0.5 tablets (25 mg total) by mouth daily. Reported on 02/14/2016   30 tablet   3   . tamsulosin (FLOMAX) 0.4 MG CAPS capsule   Oral   Take 0.4 mg by mouth daily with lunch.             Allergies Sulfa antibiotics and Celebrex  Family History  Problem Relation Age of Onset  . Cancer Other   . COPD Mother   . Arthritis Maternal Grandmother   . Cancer Maternal Grandmother   . Diabetes Maternal Grandmother     Social History Social History  Substance Use Topics  . Smoking status: Former Smoker -- 1.00 packs/day for 30 years    Types: Cigarettes  . Smokeless tobacco: Current User    Types: Chew  . Alcohol Use: 1.8 oz/week    3 Cans of beer per week     Review of Systems Constitutional: No fever/chills, fatigue Eyes: No visual changes.  ENT:  Metallic taste which has resolved, tinnitis which is intermittent. No postnasal drip, nasal congestion, sinus pressure Cardiovascular: No chest pain, palpitations. Respiratory: No shortness of breath. No wheezing.  Gastrointestinal: No abdominal pain.  No nausea, vomiting.  Musculoskeletal: Positive right shoulder pain status post orthopedic surgery. Negative for general myalgias.  Skin: Negative for rash, redness, swelling, open wounds. Neurological: Negative for headaches, focal weakness or numbness. No tingling. 10-point ROS otherwise negative.  ____________________________________________   PHYSICAL EXAM:  VITAL SIGNS: ED Triage Vitals  Enc Vitals Group     BP 06/14/16 1753 109/89 mmHg     Pulse Rate 06/14/16 1753 100     Resp 06/14/16 1753 18     Temp 06/14/16 1753 97.7 F (36.5 C)     Temp Source 06/14/16 1753 Oral     SpO2 06/14/16 1753 95 %     Weight 06/14/16 1753 270 lb (122.471 kg)     Height 06/14/16 1753 5\' 11"  (1.803 m)     Head Cir --      Peak Flow --      Pain Score 06/14/16 1754 1     Pain Loc --      Pain Edu? --      Excl. in [place]? --     Constitutional: Alert and oriented. Well appearing and in no acute distress. Eyes: Conjunctivae are normal.  Head: Atraumatic. Neck: Supple with FROM. Hematological/Lymphatic/Immunilogical: No cervical  lymphadenopathy. Cardiovascular: Normal rate, regular rhythm. Normal S1 and S2.  Good peripheral circulation. Respiratory: Normal respiratory effort without tachypnea or retractions. Lungs CTAB with breath sounds in all lung fields. Neurologic:  Normal speech and language. No gross focal neurologic deficits are appreciated.  Skin:  Skin is warm, dry and intact. No rash noted. Patient's dressings over surgical port sites are clean Psychiatric: Mood and affect are normal. Speech and behavior are normal. Patient exhibits appropriate insight and judgement.   ____________________________________________   LABS (all labs ordered are listed, but only abnormal results are displayed)  Labs Reviewed  CBC - Abnormal; Notable for the following:    WBC 11.3 (*)    HCT 39.7 (*)    RDW 14.7 (*)    All other components within normal limits  COMPREHENSIVE METABOLIC PANEL - Abnormal; Notable for the following:    Sodium 133 (*)    Chloride 99 (*)    Calcium 8.5 (*)    ALT 7 (*)    All other components within normal limits   ____________________________________________  EKG  None ____________________________________________  RADIOLOGY  None ____________________________________________    PROCEDURES  Procedure(s) performed: None      Medications - No data to display   ____________________________________________   INITIAL IMPRESSION / ASSESSMENT AND PLAN / ED COURSE  Pertinent lab results that were available during my care of the patient were reviewed by me and considered in my medical decision making (see chart for details).  The patient's lab results are stable without significant changes as compared to his discharge CBC and metabolic panel completed on 06/13/2016 at [facility].  Patient's diagnosis is consistent with abnormal taste in mouth. Patient's presentation and history was discussed with Dr. Brenton Grills who agreed that if the patient's lab work was without abnormality  and the patient felt well he could be discharged home with close follow-up with his surgeons at [facility]. It is noted that the ropivacaine that was being given to the patient through the pump has a half-life of approximately 2 hours and should've completely exited the patient's body since he turned the pump off around 1 PM this afternoon. Patient will be discharged home with instructions to follow-up with his surgeon at [facility] for further recommendations on continuing his ropivacaine pump. Patient is given ED precautions to return to the ED for any worsening or new symptoms.    ____________________________________________  FINAL CLINICAL IMPRESSION(S) / ED DIAGNOSES  Final diagnoses:  Abnormal taste in mouth      NEW MEDICATIONS STARTED DURING THIS VISIT:  Discharge Medication List as of 06/14/2016  6:57 PM           Braxton Feathers, PA-C 06/14/16 2109  Nance Pear, MD 06/15/16 [id number]

## 2016-06-14 NOTE — ED Notes (Signed)
Nerve block has been shut off since 2:30 per pt, pt had shoulder replacement on 7/17

## 2016-08-10 ENCOUNTER — Encounter: Payer: Self-pay | Admitting: *Deleted

## 2016-08-24 ENCOUNTER — Ambulatory Visit (INDEPENDENT_AMBULATORY_CARE_PROVIDER_SITE_OTHER): Payer: 59 | Admitting: General Surgery

## 2016-08-24 ENCOUNTER — Encounter: Payer: Self-pay | Admitting: General Surgery

## 2016-08-24 VITALS — BP 150/78 | HR 76 | Resp 14 | Ht 71.0 in | Wt 279.0 lb

## 2016-08-24 DIAGNOSIS — K429 Umbilical hernia without obstruction or gangrene: Secondary | ICD-10-CM

## 2016-08-24 HISTORY — DX: Umbilical hernia without obstruction or gangrene: K42.9

## 2016-08-24 NOTE — Patient Instructions (Addendum)
Hernia, Adult A hernia is the bulging of an organ or tissue through a weak spot in the muscles of the abdomen (abdominal wall). Hernias develop most often near the navel or groin. There are many kinds of hernias. Common kinds include:  Femoral hernia. This kind of hernia develops under the groin in the upper thigh area.  Inguinal hernia. This kind of hernia develops in the groin or scrotum.  Umbilical hernia. This kind of hernia develops near the navel.  Hiatal hernia. This kind of hernia causes part of the stomach to be pushed up into the chest.  Incisional hernia. This kind of hernia bulges through a scar from an abdominal surgery. CAUSES This condition may be caused by:  Heavy lifting.  Coughing over a long period of time.  Straining to have a bowel movement.  An incision made during an abdominal surgery.  A birth defect (congenital defect).  Excess weight or obesity.  Smoking.  Poor nutrition.  Cystic fibrosis.  Excess fluid in the abdomen.  Undescended testicles. SYMPTOMS Symptoms of a hernia include:  A lump on the abdomen. This is the first sign of a hernia. The lump may become more obvious with standing, straining, or coughing. It may get bigger over time if it is not treated or if the condition causing it is not treated.  Pain. A hernia is usually painless, but it may become painful over time if treatment is delayed. The pain is usually dull and may get worse with standing or lifting heavy objects. Sometimes a hernia gets tightly squeezed in the weak spot (strangulated) or stuck there (incarcerated) and causes additional symptoms. These symptoms may include:  Vomiting.  Nausea.  Constipation.  Irritability. DIAGNOSIS A hernia may be diagnosed with:  A physical exam. During the exam your health care provider may ask you to cough or to make a specific movement, because a hernia is usually more visible when you move.  Imaging tests. These can  include:  X-rays.  Ultrasound.  CT scan. TREATMENT A hernia that is small and painless may not need to be treated. A hernia that is large or painful may be treated with surgery. Inguinal hernias may be treated with surgery to prevent incarceration or strangulation. Strangulated hernias are always treated with surgery, because lack of blood to the trapped organ or tissue can cause it to die. Surgery to treat a hernia involves pushing the bulge back into place and repairing the weak part of the abdomen. HOME CARE INSTRUCTIONS  Avoid straining.  Do not lift anything heavier than 10 lb (4.5 kg).  Lift with your leg muscles, not your back muscles. This helps avoid strain.  When coughing, try to cough gently.  Prevent constipation. Constipation leads to straining with bowel movements, which can make a hernia worse or cause a hernia repair to break down. You can prevent constipation by:  Eating a high-fiber diet that includes plenty of fruits and vegetables.  Drinking enough fluids to keep your urine clear or pale yellow. Aim to drink 6-8 glasses of water per day.  Using a stool softener as directed by your health care provider.  Lose weight, if you are overweight.  Do not use any tobacco products, including cigarettes, chewing tobacco, or electronic cigarettes. If you need help quitting, ask your health care provider.  Keep all follow-up visits as directed by your health care provider. This is important. Your health care provider may need to monitor your condition. SEEK MEDICAL CARE IF:  You have   swelling, redness, and pain in the affected area.  Your bowel habits change. SEEK IMMEDIATE MEDICAL CARE IF:  You have a fever.  You have abdominal pain that is getting worse.  You feel nauseous or you vomit.  You cannot push the hernia back in place by gently pressing on it while you are lying down.  The hernia:  Changes in shape or size.  Is stuck outside the  abdomen.  Becomes discolored.  Feels hard or tender.   This information is not intended to replace advice given to you by your health care provider. Make sure you discuss any questions you have with your health care provider.   Document Released: 11/13/2005 Document Revised: 12/04/2014 Document Reviewed: 09/23/2014 Elsevier Interactive Patient Education [employer].  The patient is scheduled for surgery at [facility] on 09/12/16. He will pre admit by phone. The patient is aware of date and instructions.

## 2016-08-24 NOTE — Progress Notes (Signed)
Patient ID: Louis Salazar, male   DOB: 03-Jul-1956, 60 y.o.   MRN: [id number]  Chief Complaint  Patient presents with  . Hernia    HPI Louis Salazar is a 60 y.o. male.  Patient here today for an evaluation of a umbilical hernia.  States that they have noticed it for about 6 years  It does seem to be causing some abdominal pain with activity.  No nausea, vomiting.  HPI  Past Medical History:  Diagnosis Date  . Arthritis    right shoulder  . Enlarged prostate   . GERD (gastroesophageal reflux disease)   . Hemorrhoid   . Hypertension    controlled on med  . Neck pain    "EXPERIENCE CRICK IN NECK"  . Neuromuscular disorder ([place])    hands are tingly and go numb  . Shortness of breath dyspnea   . Substance abuse    hx of inhalants, last use 2010    Past Surgical History:  Procedure Laterality Date  . ABDOMINAL SURGERY    . COLONOSCOPY WITH PROPOFOL N/A 10/18/2015   Procedure: COLONOSCOPY WITH PROPOFOL;  Surgeon: Lucilla Lame, MD;  Location: [place];  Service: Endoscopy;  Laterality: N/A;  . ESOPHAGOGASTRODUODENOSCOPY (EGD) WITH PROPOFOL N/A 10/18/2015   Procedure: ESOPHAGOGASTRODUODENOSCOPY (EGD) WITH PROPOFOL;  Surgeon: Lucilla Lame, MD;  Location: [place];  Service: Endoscopy;  Laterality: N/A;  wants as early as possible  . HERNIA REPAIR    . NISSEN FUNDOPLICATION    . SHOULDER SURGERY  04/2016   x2  . WRIST SURGERY Right    shoulder    Family History  Problem Relation Age of Onset  . Cancer Other   . COPD Mother   . Arthritis Maternal Grandmother   . Cancer Maternal Grandmother   . Diabetes Maternal Grandmother     Social History Social History  Substance Use Topics  . Smoking status: Former Smoker    Packs/day: 1.00    Years: 30.00    Types: Cigarettes    Quit date: 11/28/1995  . Smokeless tobacco: Current User    Types: Chew  . Alcohol use 1.8 oz/week    3 Cans of beer per week    Allergies  Allergen Reactions  . Sulfa  Antibiotics Other (See Comments)    Unknown - childhood allergy  . Celebrex [Celecoxib] Hives and Rash    Current Outpatient Prescriptions  Medication Sig Dispense Refill  . ANDRODERM 4 MG/24HR PT24 patch 1 patch. Twice a week  0  . Apoaequorin (PREVAGEN PO) Take by mouth daily.    . finasteride (PROSCAR) 5 MG tablet Take 5 mg by mouth daily with lunch.     . metoprolol succinate (TOPROL-XL) 25 MG 24 hr tablet Take 1 tablet (25 mg total) by mouth 2 (two) times daily. 90 tablet prn  . Misc Natural Products (OSTEO BI-FLEX JOINT SHIELD PO) Take by mouth daily.    . Multiple Vitamins-Minerals (CENTRUM SILVER PO) Take by mouth.    . Omega-3 Fatty Acids (FISH OIL CONCENTRATE PO) Take by mouth. Daily    . tamsulosin (FLOMAX) 0.4 MG CAPS capsule Take 0.4 mg by mouth daily with lunch.      No current facility-administered medications for this visit.     Review of Systems Review of Systems  Constitutional: Negative.   Respiratory: Negative.   Cardiovascular: Negative.     Blood pressure (!) 150/78, pulse 76, resp. rate 14, height 5\' 11"  (1.803 m), weight 279 lb (126.6  kg).  Physical Exam Physical Exam  Constitutional: He is oriented to person, place, and time. He appears well-developed and well-nourished.  Eyes: Conjunctivae are normal. No scleral icterus.  Neck: Neck supple.  Cardiovascular: Normal rate, regular rhythm and normal heart sounds.   Pulmonary/Chest: Effort normal and breath sounds normal.  Abdominal: Soft. Normal appearance and bowel sounds are normal. There is no hepatomegaly. There is no tenderness. A hernia ( umbilical hernia) is present.    Well healed incision above the umbilicus.  Lymphadenopathy:    He has no cervical adenopathy.  Neurological: He is alert and oriented to person, place, and time.  Skin: Skin is warm and dry.  Psychiatric: His behavior is normal.    Data Reviewed PCP notes of 08/09/2016 reviewed.  Laboratory studies of the same date  reviewed. Normal CBC with a hemoglobin of 15, MCV of 84, white blood cell count 8500. Competence of palate was notable only for minimal elevation of the serum sodium of 145. Creatinine 0.97, estimated GFR 85. Elevated hemoglobin A1c of 5.9.  Assessment    Umbilical versus epigastric hernia.    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    Hernia precautions and incarceration were discussed with the patient. If they develop symptoms of an incarcerated hernia, they were encouraged to seek prompt medical attention. I have recommended repair of the hernia on an outpatient basis in the near future. The risk of infection was reviewed. The role of prosthetic mesh to minimize the risk of recurrence face sizable or multiple defects are identified was reviewed. The patient is ambivalent about the use of mesh. He'll notify me the morning of surgery of whether this is or is not permissible based on the operative findings.  The patient is scheduled for surgery at [facility] on 09/12/16. He will pre admit by phone. The patient is aware of date and instructions.   This information has been scribed by Valarie Cones RN, BSN,BC.   Louis Salazar 08/24/2016, 8:40 PM

## 2016-09-05 ENCOUNTER — Encounter
Admission: RE | Admit: 2016-09-05 | Discharge: 2016-09-05 | Disposition: A | Discharge status: Home / Self Care | Payer: 59 | Source: Ambulatory Visit | Attending: General Surgery | Admitting: General Surgery

## 2016-09-05 HISTORY — DX: Prediabetes: R73.03

## 2016-09-05 NOTE — Patient Instructions (Signed)
  Your procedure is scheduled on: 09-12-16 Report to Same Day Surgery 2nd floor medical mall To find out your arrival time please call [phone] between 1PM - 3PM on 09-11-16  Remember: Instructions that are not followed completely may result in serious medical risk, up to and including death, or upon the discretion of your surgeon and anesthesiologist your surgery may need to be rescheduled.    _x___ 1. Do not eat food or drink liquids after midnight. No gum chewing or hard candies.     __x__ 2. No Alcohol for 24 hours before or after surgery.   __x__3. No Smoking for 24 prior to surgery.   ____  4. Bring all medications with you on the day of surgery if instructed.    __x__ 5. Notify your doctor if there is any change in your medical condition     (cold, fever, infections).     Do not wear jewelry, make-up, hairpins, clips or nail polish.  Do not wear lotions, powders, or perfumes. You may wear deodorant.  Do not shave 48 hours prior to surgery. Men may shave face and neck.  Do not bring valuables to the hospital.    [facility] is not responsible for any belongings or valuables.               Contacts, dentures or bridgework may not be worn into surgery.  Leave your suitcase in the car. After surgery it may be brought to your room.  For patients admitted to the hospital, discharge time is determined by your treatment team.   Patients discharged the day of surgery will not be allowed to drive home.    Please read over the following fact sheets that you were given:   [facility] Preparing for Surgery and or MRSA Information   _x___ Take these medicines the morning of surgery with A SIP OF WATER:    1. METOPROLOL  2.  3.  4.  5.  6.  ____Fleets enema or Magnesium Citrate as directed.   ____ Use CHG Soap or sage wipes as directed on instruction sheet   ____ Use inhalers on the day of surgery and bring to hospital day of surgery  ____ Stop metformin 2 days prior to  surgery    ____ Take 1/2 of usual insulin dose the night before surgery and none on the morning of surgery.   ____ Stop aspirin or coumadin, or plavix  __ Stop Anti-inflammatories such as Advil, Aleve, Ibuprofen, Motrin, Naproxen,          Naprosyn, Goodies powders or aspirin products. Ok to take Tylenol.   _X___ Stop supplements until after surgery-STOP PREVAGEN AND OSTEOBIFLEX NOW-PT STOPPED FISH OIL LAST WEEK (08-28-16)  ____ Bring C-Pap to the hospital.

## 2016-09-12 ENCOUNTER — Ambulatory Visit: Payer: Commercial Managed Care - HMO | Admitting: Anesthesiology

## 2016-09-12 ENCOUNTER — Encounter
Admission: RE | Disposition: A | Discharge status: Home / Self Care | Payer: Self-pay | Source: Ambulatory Visit | Attending: General Surgery

## 2016-09-12 ENCOUNTER — Ambulatory Visit
Admission: RE | Admit: 2016-09-12 | Discharge: 2016-09-12 | Disposition: A | Discharge status: Home / Self Care | Payer: Commercial Managed Care - HMO | Source: Ambulatory Visit | Attending: General Surgery | Admitting: General Surgery

## 2016-09-12 ENCOUNTER — Encounter: Payer: Self-pay | Admitting: *Deleted

## 2016-09-12 DIAGNOSIS — K432 Incisional hernia without obstruction or gangrene: Secondary | ICD-10-CM | POA: Diagnosis not present

## 2016-09-12 DIAGNOSIS — K439 Ventral hernia without obstruction or gangrene: Secondary | ICD-10-CM | POA: Diagnosis present

## 2016-09-12 DIAGNOSIS — K219 Gastro-esophageal reflux disease without esophagitis: Secondary | ICD-10-CM | POA: Diagnosis not present

## 2016-09-12 DIAGNOSIS — Z809 Family history of malignant neoplasm, unspecified: Secondary | ICD-10-CM | POA: Diagnosis not present

## 2016-09-12 DIAGNOSIS — G709 Myoneural disorder, unspecified: Secondary | ICD-10-CM | POA: Diagnosis not present

## 2016-09-12 DIAGNOSIS — Z888 Allergy status to other drugs, medicaments and biological substances status: Secondary | ICD-10-CM | POA: Insufficient documentation

## 2016-09-12 DIAGNOSIS — M19011 Primary osteoarthritis, right shoulder: Secondary | ICD-10-CM | POA: Diagnosis not present

## 2016-09-12 DIAGNOSIS — Z6838 Body mass index (BMI) 38.0-38.9, adult: Secondary | ICD-10-CM | POA: Insufficient documentation

## 2016-09-12 DIAGNOSIS — I10 Essential (primary) hypertension: Secondary | ICD-10-CM | POA: Insufficient documentation

## 2016-09-12 DIAGNOSIS — Z833 Family history of diabetes mellitus: Secondary | ICD-10-CM | POA: Insufficient documentation

## 2016-09-12 DIAGNOSIS — Z882 Allergy status to sulfonamides status: Secondary | ICD-10-CM | POA: Diagnosis not present

## 2016-09-12 DIAGNOSIS — Z87891 Personal history of nicotine dependence: Secondary | ICD-10-CM | POA: Insufficient documentation

## 2016-09-12 DIAGNOSIS — Z8261 Family history of arthritis: Secondary | ICD-10-CM | POA: Insufficient documentation

## 2016-09-12 DIAGNOSIS — M542 Cervicalgia: Secondary | ICD-10-CM | POA: Diagnosis not present

## 2016-09-12 DIAGNOSIS — Z79899 Other long term (current) drug therapy: Secondary | ICD-10-CM | POA: Insufficient documentation

## 2016-09-12 DIAGNOSIS — Z825 Family history of asthma and other chronic lower respiratory diseases: Secondary | ICD-10-CM | POA: Insufficient documentation

## 2016-09-12 DIAGNOSIS — K429 Umbilical hernia without obstruction or gangrene: Secondary | ICD-10-CM | POA: Diagnosis not present

## 2016-09-12 HISTORY — PX: UMBILICAL HERNIA REPAIR: SHX196

## 2016-09-12 LAB — GLUCOSE, CAPILLARY
GLUCOSE-CAPILLARY: 116 mg/dL — AB (ref 65–99)
GLUCOSE-CAPILLARY: 108 mg/dL — AB (ref 65–99)

## 2016-09-12 SURGERY — REPAIR, HERNIA, UMBILICAL, ADULT
Anesthesia: General | Wound class: Clean

## 2016-09-12 MED ORDER — ROCURONIUM BROMIDE 100 MG/10ML IV SOLN
INTRAVENOUS | Status: DC | PRN
  Administered 2016-09-12: 10 mg via INTRAVENOUS
  Administered 2016-09-12: 30 mg via INTRAVENOUS

## 2016-09-12 MED ORDER — ONDANSETRON HCL 4 MG/2ML IJ SOLN
INTRAMUSCULAR | Status: DC | PRN
  Administered 2016-09-12: 4 mg via INTRAVENOUS

## 2016-09-12 MED ORDER — ACETAMINOPHEN 10 MG/ML IV SOLN
INTRAVENOUS | Status: DC | PRN
  Administered 2016-09-12: 1000 mg via INTRAVENOUS

## 2016-09-12 MED ORDER — ACETAMINOPHEN 10 MG/ML IV SOLN
INTRAVENOUS | Status: AC
Start: 2016-09-12 — End: 2016-09-12
  Filled 2016-09-12: qty 100

## 2016-09-12 MED ORDER — HYDROCODONE-ACETAMINOPHEN 5-325 MG PO TABS: 1.0000 | ORAL_TABLET | ORAL | 0 refills | Status: DC | PRN

## 2016-09-12 MED ORDER — ONDANSETRON HCL 4 MG/2ML IJ SOLN
4.0000 mg | Freq: Once | INTRAMUSCULAR | Status: DC | PRN
Start: 2016-09-12 — End: 2016-09-12

## 2016-09-12 MED ORDER — FENTANYL CITRATE (PF) 100 MCG/2ML IJ SOLN
INTRAMUSCULAR | Status: DC | PRN
  Administered 2016-09-12 (×2): 50 ug via INTRAVENOUS

## 2016-09-12 MED ORDER — SODIUM CHLORIDE 0.9 % IV SOLN
INTRAVENOUS | Status: DC
  Administered 2016-09-12: 09:00:00 via INTRAVENOUS

## 2016-09-12 MED ORDER — PHENYLEPHRINE HCL 10 MG/ML IJ SOLN
INTRAMUSCULAR | Status: DC | PRN
  Administered 2016-09-12: 50 ug via INTRAVENOUS
  Administered 2016-09-12 (×3): 100 ug via INTRAVENOUS
  Administered 2016-09-12: 200 ug via INTRAVENOUS
  Administered 2016-09-12: 100 ug via INTRAVENOUS

## 2016-09-12 MED ORDER — SUCCINYLCHOLINE CHLORIDE 20 MG/ML IJ SOLN
INTRAMUSCULAR | Status: DC | PRN
  Administered 2016-09-12: 100 mg via INTRAVENOUS

## 2016-09-12 MED ORDER — FENTANYL CITRATE (PF) 100 MCG/2ML IJ SOLN
INTRAMUSCULAR | Status: AC
  Filled 2016-09-12: qty 2

## 2016-09-12 MED ORDER — BUPIVACAINE HCL (PF) 0.5 % IJ SOLN
INTRAMUSCULAR | Status: AC
  Filled 2016-09-12: qty 30

## 2016-09-12 MED ORDER — SODIUM BICARBONATE 4 % IV SOLN
INTRAVENOUS | Status: AC
  Filled 2016-09-12: qty 5

## 2016-09-12 MED ORDER — FAMOTIDINE 20 MG PO TABS
ORAL_TABLET | ORAL | Status: AC
  Filled 2016-09-12: qty 1

## 2016-09-12 MED ORDER — SUGAMMADEX SODIUM 500 MG/5ML IV SOLN
INTRAVENOUS | Status: DC | PRN
  Administered 2016-09-12: 250 mg via INTRAVENOUS

## 2016-09-12 MED ORDER — EPHEDRINE SULFATE 50 MG/ML IJ SOLN
INTRAMUSCULAR | Status: DC | PRN
  Administered 2016-09-12: 10 mg via INTRAVENOUS

## 2016-09-12 MED ORDER — FAMOTIDINE 20 MG PO TABS
20.0000 mg | ORAL_TABLET | Freq: Once | ORAL | Status: AC
  Administered 2016-09-12: 20 mg via ORAL

## 2016-09-12 MED ORDER — FENTANYL CITRATE (PF) 100 MCG/2ML IJ SOLN
25.0000 ug | INTRAMUSCULAR | Status: AC | PRN
  Administered 2016-09-12 (×6): 25 ug via INTRAVENOUS

## 2016-09-12 MED ORDER — MIDAZOLAM HCL 2 MG/2ML IJ SOLN
INTRAMUSCULAR | Status: DC | PRN
  Administered 2016-09-12: 2 mg via INTRAVENOUS

## 2016-09-12 MED ORDER — BUPIVACAINE HCL (PF) 0.5 % IJ SOLN
INTRAMUSCULAR | Status: DC | PRN
  Administered 2016-09-12: 30 mL

## 2016-09-12 MED ORDER — LIDOCAINE HCL (CARDIAC) 20 MG/ML IV SOLN
INTRAVENOUS | Status: DC | PRN
  Administered 2016-09-12: 100 mg via INTRAVENOUS

## 2016-09-12 MED ORDER — PROPOFOL 10 MG/ML IV BOLUS
INTRAVENOUS | Status: DC | PRN
  Administered 2016-09-12: 180 mg via INTRAVENOUS

## 2016-09-12 MED ORDER — LIDOCAINE HCL (PF) 1 % IJ SOLN
INTRAMUSCULAR | Status: AC
  Filled 2016-09-12: qty 30

## 2016-09-12 SURGICAL SUPPLY — 31 items
BLADE SURG 15 STRL SS SAFETY (BLADE) ×2 IMPLANT
CANISTER SUCT 1200ML W/VALVE (MISCELLANEOUS) ×2 IMPLANT
CHLORAPREP W/TINT 26ML (MISCELLANEOUS) ×2 IMPLANT
DRAPE LAPAROTOMY 100X77 ABD (DRAPES) ×2 IMPLANT
DRESSING TELFA 4X3 1S ST N-ADH (GAUZE/BANDAGES/DRESSINGS) ×2 IMPLANT
DRSG TEGADERM 4X4.75 (GAUZE/BANDAGES/DRESSINGS) ×2 IMPLANT
ELECT REM PT RETURN 9FT ADLT (ELECTROSURGICAL) ×2
ELECTRODE REM PT RTRN 9FT ADLT (ELECTROSURGICAL) ×1 IMPLANT
GAUZE SPONGE 4X4 12PLY STRL (GAUZE/BANDAGES/DRESSINGS) ×1 IMPLANT
GLOVE BIO SURGEON STRL SZ7.5 (GLOVE) ×4 IMPLANT
GLOVE INDICATOR 8.0 STRL GRN (GLOVE) ×3 IMPLANT
GOWN STRL REUS W/ TWL LRG LVL3 (GOWN DISPOSABLE) ×2 IMPLANT
GOWN STRL REUS W/TWL LRG LVL3 (GOWN DISPOSABLE) ×4
KIT RM TURNOVER STRD PROC AR (KITS) ×2 IMPLANT
LABEL OR SOLS (LABEL) ×2 IMPLANT
NDL HYPO 25X1 1.5 SAFETY (NEEDLE) ×1 IMPLANT
NDL SAFETY 22GX1.5 (NEEDLE) ×4 IMPLANT
NEEDLE HYPO 25X1 1.5 SAFETY (NEEDLE) ×2 IMPLANT
NS IRRIG 500ML POUR BTL (IV SOLUTION) ×2 IMPLANT
PACK BASIN MINOR ARMC (MISCELLANEOUS) ×2 IMPLANT
STRIP CLOSURE SKIN 1/2X4 (GAUZE/BANDAGES/DRESSINGS) ×2 IMPLANT
SUT PROLENE 0 CT 1 30 (SUTURE) ×1 IMPLANT
SUT SURGILON 0 BLK (SUTURE) ×3 IMPLANT
SUT VIC AB 3-0 54X BRD REEL (SUTURE) ×2 IMPLANT
SUT VIC AB 3-0 BRD 54 (SUTURE) ×2
SUT VIC AB 3-0 SH 27 (SUTURE) ×2
SUT VIC AB 3-0 SH 27X BRD (SUTURE) ×1 IMPLANT
SUT VIC AB 4-0 FS2 27 (SUTURE) ×2 IMPLANT
SWABSTK COMLB BENZOIN TINCTURE (MISCELLANEOUS) ×2 IMPLANT
SYR 3ML LL SCALE MARK (SYRINGE) ×2 IMPLANT
SYR CONTROL 10ML (SYRINGE) ×4 IMPLANT

## 2016-09-12 NOTE — Transfer of Care (Signed)
Immediate Anesthesia Transfer of Care Note  Patient: Louis Salazar  Procedure(s) Performed: Procedure(s): HERNIA REPAIR UMBILICAL ADULT (N/A)  Patient Location: PACU  Anesthesia Type:General  Level of Consciousness: awake, alert  and oriented  Airway & Oxygen Therapy: Patient connected to face mask oxygen  Post-op Assessment: Post -op Vital signs reviewed and stable  Post vital signs: stable  Last Vitals:  Vitals:   09/12/16 0824 09/12/16 1014  BP: (!) 142/99 (!) 154/95  Pulse: 87 92  Resp: 16 14  Temp: 36.6 C 36.9 C    Last Pain:  Vitals:   09/12/16 1014  TempSrc: Tympanic         Complications: No apparent anesthesia complications

## 2016-09-12 NOTE — Anesthesia Preprocedure Evaluation (Signed)
Anesthesia Evaluation  Patient identified by MRN, date of birth, ID band Patient awake    Reviewed: Allergy & Precautions, NPO status , Patient's Chart, lab work & pertinent test results  History of Anesthesia Complications Negative for: history of anesthetic complications  Airway Mallampati: II       Dental  (+) Teeth Intact, Caps   Pulmonary neg pulmonary ROS, former smoker,     + decreased breath sounds      Cardiovascular Exercise Tolerance: Good hypertension, Pt. on home beta blockers  Rhythm:Regular     Neuro/Psych negative neurological ROS     GI/Hepatic GERD  Medicated,(+)     substance abuse  alcohol use,   Endo/Other  Morbid obesity  Renal/GU negative Renal ROS     Musculoskeletal   Abdominal (+) + obese,   Peds  Hematology negative hematology ROS (+)   Anesthesia Other Findings   Reproductive/Obstetrics                             Anesthesia Physical Anesthesia Plan  ASA: III  Anesthesia Plan: General   Post-op Pain Management:    Induction: Intravenous  Airway Management Planned: Oral ETT  Additional Equipment:   Intra-op Plan:   Post-operative Plan: Extubation in OR  Informed Consent: I have reviewed the patients History and Physical, chart, labs and discussed the procedure including the risks, benefits and alternatives for the proposed anesthesia with the patient or authorized representative who has indicated his/her understanding and acceptance.     Plan Discussed with: CRNA  Anesthesia Plan Comments:         Anesthesia Quick Evaluation

## 2016-09-12 NOTE — Discharge Instructions (Signed)
AMBULATORY SURGERY  °DISCHARGE INSTRUCTIONS ° ° °1) The drugs that you were given will stay in your system until tomorrow so for the next 24 hours you should not: ° °A) Drive an automobile °B) Make any legal decisions °C) Drink any alcoholic beverage ° ° °2) You may resume regular meals tomorrow.  Today it is better to start with liquids and gradually work up to solid foods. ° °You may eat anything you prefer, but it is better to start with liquids, then soup and crackers, and gradually work up to solid foods. ° ° °3) Please notify your doctor immediately if you have any unusual bleeding, trouble breathing, redness and pain at the surgery site, drainage, fever, or pain not relieved by medication. ° ° ° °4) Additional Instructions: ° ° ° ° ° ° ° °Please contact your physician with any problems or Same Day Surgery at [phone], Monday through Friday 6 am to 4 pm, or [place] [facility] [place] Main number at [phone]. °

## 2016-09-12 NOTE — Op Note (Signed)
Preoperative diagnosis: Ventral hernia.  Postoperative diagnosis: Same.  Operative procedure: Repair of ventral hernia.  Operating surgeon: Ollen Bowl, M.D.  Anesthesia: Gen. endotracheal, Marcaine 0.5% with 1-200,000 epinephrine, 30 mL.  Estimated blood loss: Less than 5 mL.  Clinical note: This 60 year old male has previously undergone laparoscopic Nissen fundoplication. He is developed a nonreducible hernia at the umbilical site which is superior and to the right of the midline. He is brought to the operative for planned excision. Here in the surgical site with clippers prior to presentation to the operating theater. Preoperative antibiotics were not utilized as it was not planned use of prosthetic mesh.  Operative note: With the patient under adequate general endotracheal anesthesia the abdomen was prepped with ChloraPrep and draped. Field block anesthesia was provided. The original in for milk incision was opened. This was extended 5 mm on each side to provide better exposure. The umbilical skin was elevated off the underlying fascia. The multilobulated hernia sac was found to be coming through 1.2 cm fascial defect just superior to the umbilicus itself. The hernia sac was excised and discarded. The undersurface of the fascial defect was cleared. The contents were reduced into the abdominal cavity. Interrupted 0 Surgilons sutures were placed and then tied sequentially. The umbilical skin was tacked to the fascia with a 3-0 Vicryl figure-of-eight suture. The adipose layer was approximated with interrupted 3-0 Vicryl sutures. The skin was then closed with interrupted 4-0 Vicryl subcuticular sutures. Benzoin, Steri-Strips, Telfa and Tegaderm dressing applied.  The patient tolerated the procedure well was taken to recovery room in stable condition.

## 2016-09-12 NOTE — H&P (Signed)
No change in clinical history or exam. For umbilical hernia repair.  

## 2016-09-12 NOTE — Anesthesia Procedure Notes (Signed)
Procedure Name: Intubation Performed by: Aline Brochure Pre-anesthesia Checklist: Patient identified, Emergency Drugs available, Suction available, Patient being monitored and Timeout performed Patient Re-evaluated:Patient Re-evaluated prior to inductionOxygen Delivery Method: Circle system utilized Preoxygenation: Pre-oxygenation with 100% oxygen Intubation Type: IV induction Ventilation: Two handed mask ventilation required Laryngoscope Size: Mac and 4 Tube type: Oral Tube size: 7.5 mm Number of attempts: 1 Airway Equipment and Method: Patient positioned with wedge pillow and Stylet Placement Confirmation: ETT inserted through vocal cords under direct vision,  positive ETCO2 and breath sounds checked- equal and bilateral Secured at: 23 cm Dental Injury: Teeth and Oropharynx as per pre-operative assessment

## 2016-09-12 NOTE — Anesthesia Postprocedure Evaluation (Signed)
Anesthesia Post Note  Patient: Louis Salazar  Procedure(s) Performed: Procedure(s) (LRB): HERNIA REPAIR UMBILICAL ADULT (N/A)  Patient location during evaluation: PACU Anesthesia Type: General Level of consciousness: awake Pain management: pain level controlled Vital Signs Assessment: post-procedure vital signs reviewed and stable Respiratory status: spontaneous breathing Cardiovascular status: stable Anesthetic complications: no    Last Vitals:  Vitals:   09/12/16 1050 09/12/16 1055  BP:  137/87  Pulse: 88 86  Resp: (!) 9 (!) 7  Temp: 36.9 C     Last Pain:  Vitals:   09/12/16 1055  TempSrc:   PainSc: 0-No pain                 VAN STAVEREN,Evert Wenrich

## 2016-09-19 ENCOUNTER — Ambulatory Visit (INDEPENDENT_AMBULATORY_CARE_PROVIDER_SITE_OTHER): Payer: 59 | Admitting: General Surgery

## 2016-09-19 ENCOUNTER — Encounter: Payer: Self-pay | Admitting: General Surgery

## 2016-09-19 VITALS — BP 144/94 | HR 78 | Resp 16 | Ht 71.0 in | Wt 282.0 lb

## 2016-09-19 DIAGNOSIS — K429 Umbilical hernia without obstruction or gangrene: Secondary | ICD-10-CM

## 2016-09-19 NOTE — Patient Instructions (Addendum)
The patient is aware to call back for any questions or concerns. Proper lifting techniques reviewed. Resume activities as tolerated. Follow up as needed.  

## 2016-09-19 NOTE — Progress Notes (Signed)
Patient ID: Louis Salazar, male   DOB: 1956-11-05, 60 y.o.   MRN: [id number]  Chief Complaint  Patient presents with  . Routine Post Op    umbililcal hernia    HPI Louis Salazar is a 60 y.o. male here today for his umbilical hernia repair done on 09/12/16. Patient states he is doing well. Bowles moving regular.  HPI  Past Medical History:  Diagnosis Date  . Arthritis    right shoulder  . Enlarged prostate   . GERD (gastroesophageal reflux disease)   . Hemorrhoid   . Hypertension    controlled on med  . Neck pain    "EXPERIENCE CRICK IN NECK"  . Neuromuscular disorder ([place])    hands are tingly and go numb  . Pre-diabetes    A1C IN JULY 2017 WAS 6.2  . Substance abuse    hx of inhalants, last use 2010    Past Surgical History:  Procedure Laterality Date  . ABDOMINAL SURGERY    . BUNIONECTOMY Left   . COLONOSCOPY WITH PROPOFOL N/A 10/18/2015   Procedure: COLONOSCOPY WITH PROPOFOL;  Surgeon: Lucilla Lame, MD;  Location: [place];  Service: Endoscopy;  Laterality: N/A;  . ESOPHAGOGASTRODUODENOSCOPY (EGD) WITH PROPOFOL N/A 10/18/2015   Procedure: ESOPHAGOGASTRODUODENOSCOPY (EGD) WITH PROPOFOL;  Surgeon: Lucilla Lame, MD;  Location: [place];  Service: Endoscopy;  Laterality: N/A;  wants as early as possible  . HERNIA REPAIR    . NISSEN FUNDOPLICATION    . SHOULDER SURGERY  04/2016   Right shoulder replacement  . UMBILICAL HERNIA REPAIR N/A 09/12/2016   Procedure: HERNIA REPAIR UMBILICAL ADULT;  Surgeon: Robert Bellow, MD;  Location: [facility];  Service: General;  Laterality: N/A;  . WRIST SURGERY Right    shoulder    Family History  Problem Relation Age of Onset  . Cancer Other   . COPD Mother   . Arthritis Maternal Grandmother   . Cancer Maternal Grandmother   . Diabetes Maternal Grandmother     Social History Social History  Substance Use Topics  . Smoking status: Former Smoker    Packs/day: 1.00    Years: 30.00    Types:  Cigarettes    Quit date: 11/28/1995  . Smokeless tobacco: Current User    Types: Chew  . Alcohol use 1.8 oz/week    3 Cans of beer per week     Comment: 2 BEERS QD    Allergies  Allergen Reactions  . Sulfa Antibiotics Other (See Comments)    Unknown - childhood allergy  . Celebrex [Celecoxib] Hives and Rash    Current Outpatient Prescriptions  Medication Sig Dispense Refill  . ANDRODERM 4 MG/24HR PT24 patch 1 patch. Twice a week  0  . finasteride (PROSCAR) 5 MG tablet Take 5 mg by mouth daily with lunch.     . metoprolol succinate (TOPROL-XL) 25 MG 24 hr tablet Take 1 tablet (25 mg total) by mouth 2 (two) times daily. 90 tablet prn  . Misc Natural Products (OSTEO BI-FLEX JOINT SHIELD PO) Take 1 tablet by mouth daily.     . Multiple Vitamins-Minerals (CENTRUM SILVER PO) Take by mouth.    . Omega-3 Fatty Acids (FISH OIL CONCENTRATE PO) Take 1 tablet by mouth daily. Daily     . tamsulosin (FLOMAX) 0.4 MG CAPS capsule Take 0.4 mg by mouth daily with lunch.      No current facility-administered medications for this visit.     Review of Systems Review of  Systems  Constitutional: Negative.   Respiratory: Negative.   Cardiovascular: Negative.     Blood pressure (!) 144/94, pulse 78, resp. rate 16, height 5\' 11"  (1.803 m), weight 282 lb (127.9 kg).  Physical Exam Physical Exam  Constitutional: He is oriented to person, place, and time. He appears well-developed and well-nourished.  Abdominal: Soft. Normal appearance.    Repair intact  Neurological: He is alert and oriented to person, place, and time.  Skin: Skin is warm and dry.  Psychiatric: His behavior is normal.      Assessment    Doing well status post umbilical hernia repair.    Plan    Patient will plan on returning to work tomorrow. Proper lifting technique reviewed.    Proper lifting techniques reviewed. Resume activities as tolerated. Follow up as needed. The patient is aware to call back for any  questions or concerns.   This information has been scribed by Karie Fetch RN, BSN,BC. Marland Kitchen  Robert Bellow 09/19/2016, 9:08 AM

## 2017-03-25 IMAGING — CT CT SHOULDER*R* W/O CM
2 of 3 series · 13 of 20 positions shown, 16 images · non-contrast
Comparison: CT scan dated 09/08/2013

CLINICAL DATA: Chronic right shoulder pain. Preoperative planning.
Severe osteoarthritis of the glenohumeral joint.

EXAM:
CT OF THE RIGHT SHOULDER WITHOUT CONTRAST
TECHNIQUE: Multidetector CT imaging was performed according to the standard
protocol. Multiplanar CT image reconstructions were also generated.

[Series 2: soft tissue · axial · 0.46mm/px · z∈[-578,-446]mm · 12 of 80 slices shown, 15 images]
[im 7/80  soft-tissue]
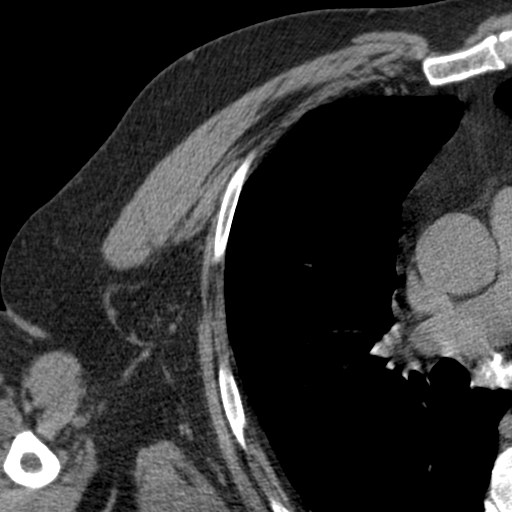
[im 7/80  bone]
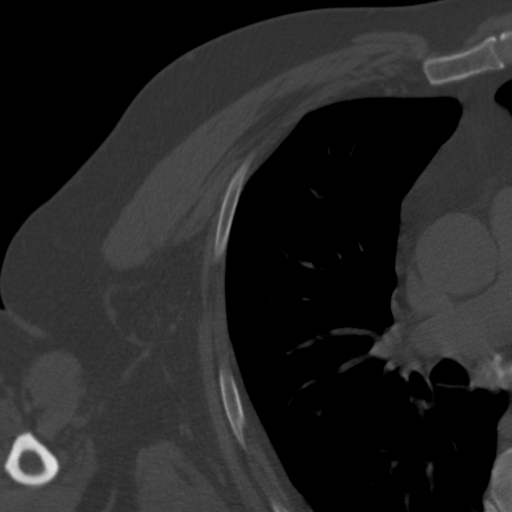
[im 13/80  bone]
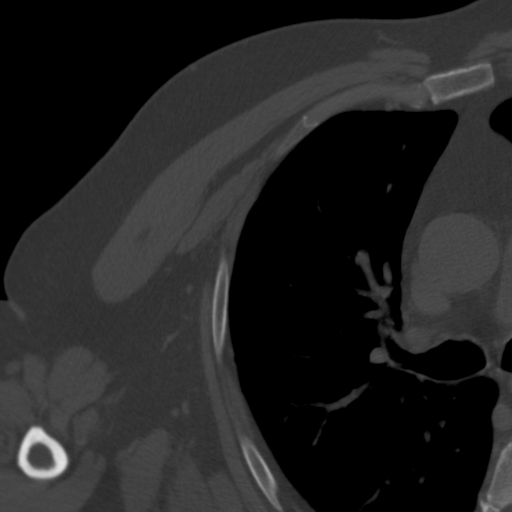
[im 19/80  bone]
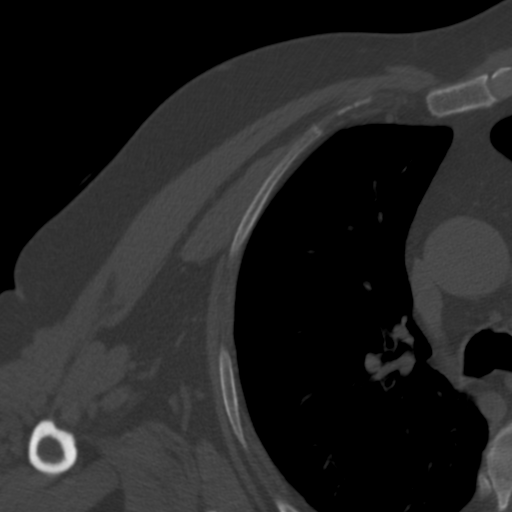
[im 25/80  bone]
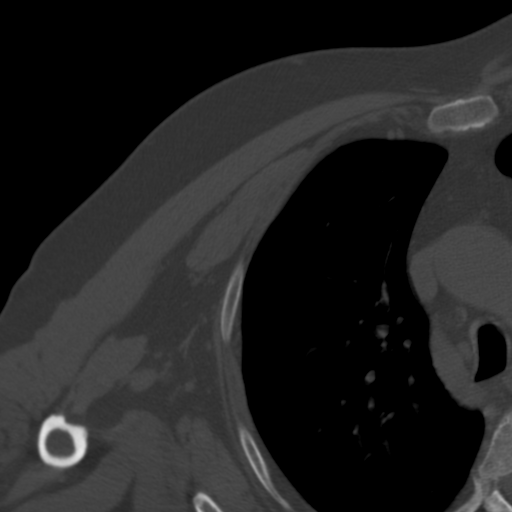
[im 31/80  soft-tissue]
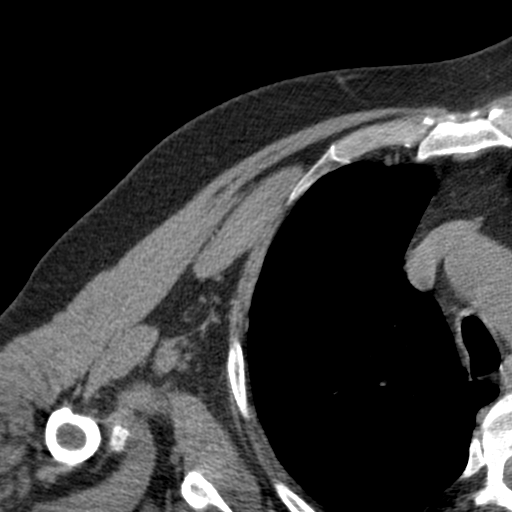
[im 31/80  bone]
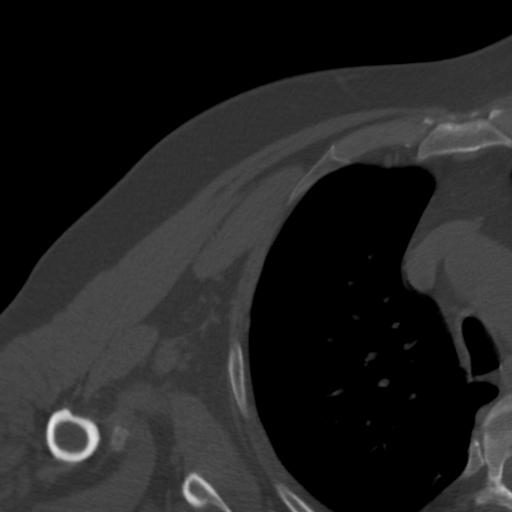
[im 37/80  bone]
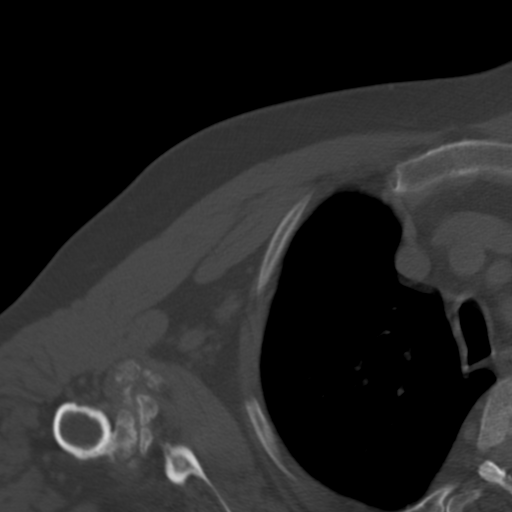
[im 43/80  bone]
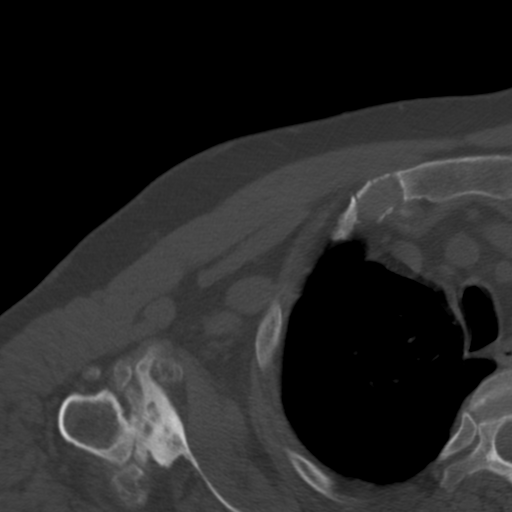
[im 49/80  bone]
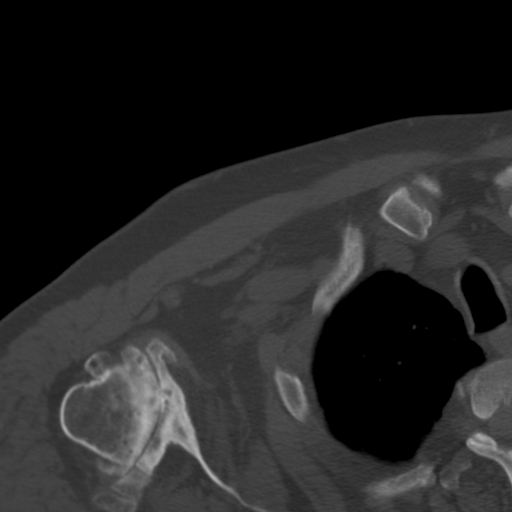
[im 55/80  soft-tissue]
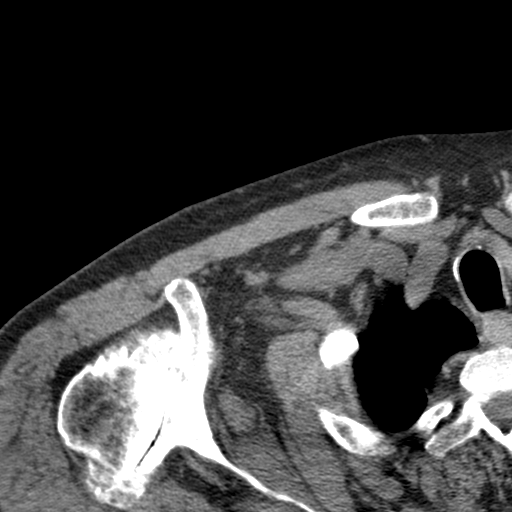
[im 55/80  bone]
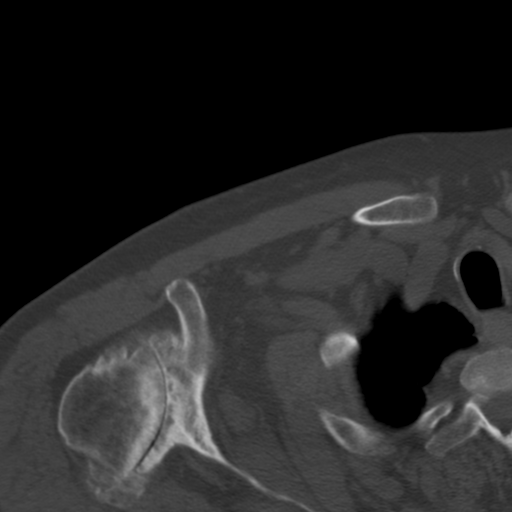
[im 61/80  bone]
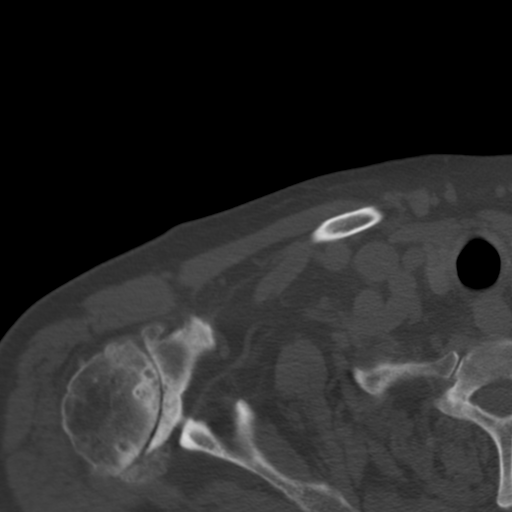
[im 67/80  bone]
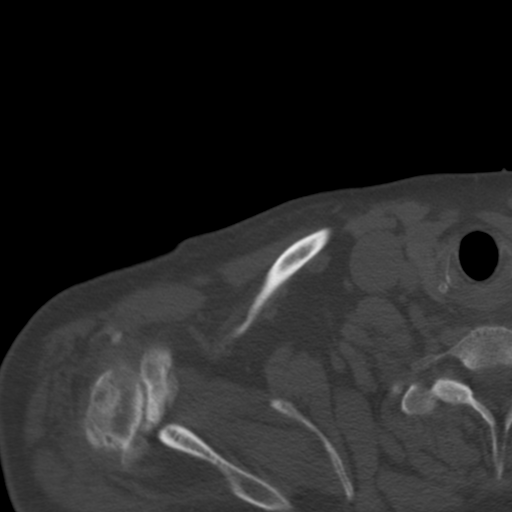
[im 73/80  bone]
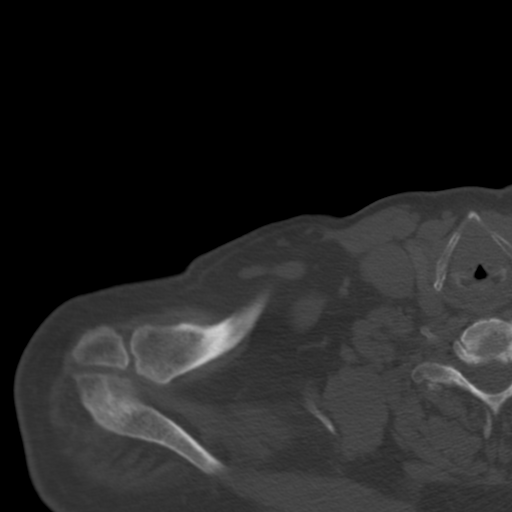

[Series 6: cor bone · coronal · 0.33mm/px · 1 of 127 slices shown]
[im 64/127  bone]
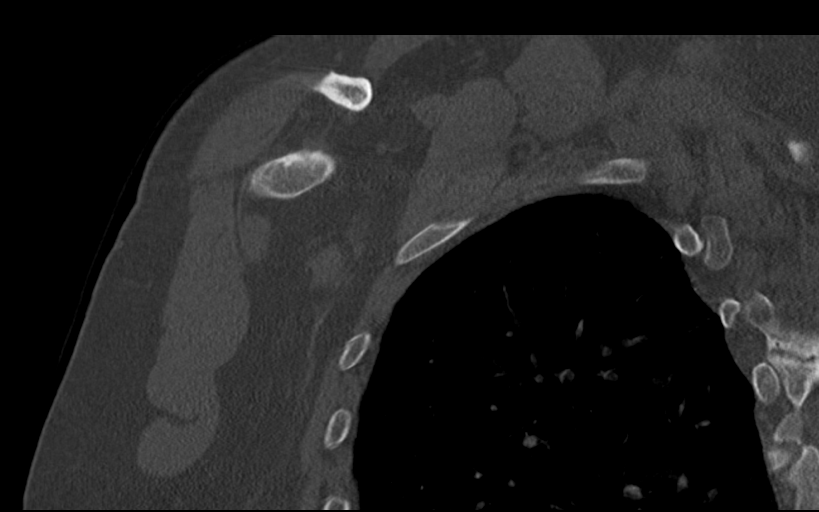

[13 of 20 positions shown; findings below may reference images not displayed]

FINDINGS: There is progressive severe osteoarthritis of the glenohumeral joint
with prominent osteophytes on the humeral head which should severely
restrict motion in all directions.

Multiple ossified loose bodies in the joint, slightly progressed.
Extensive full-thickness cartilage loss with subcortical erosions
and subcortical cysts of the eroded glenoid and humeral head.

AC joint: Ununited os acromiale. Narrowing of the subacromial space.

Interval development of moderate atrophy of the infraspinatus and
teres minor muscles.

Marked attenuation of the long head of the biceps tendon.
IMPRESSION: Progressive severe osteoarthritis of the glenohumeral joint. Chronic
os acromiale.

Slight atrophy of the infraspinatus and teres minor muscles.

## 2017-08-15 ENCOUNTER — Emergency Department: Payer: Commercial Managed Care - HMO

## 2017-08-15 ENCOUNTER — Emergency Department
Admission: EM | Admit: 2017-08-15 | Discharge: 2017-08-15 | Disposition: A | Discharge status: Home / Self Care | Payer: Commercial Managed Care - HMO | Attending: Emergency Medicine | Admitting: Emergency Medicine

## 2017-08-15 ENCOUNTER — Encounter: Payer: Self-pay | Admitting: Emergency Medicine

## 2017-08-15 DIAGNOSIS — Z79899 Other long term (current) drug therapy: Secondary | ICD-10-CM | POA: Insufficient documentation

## 2017-08-15 DIAGNOSIS — R1030 Lower abdominal pain, unspecified: Secondary | ICD-10-CM | POA: Diagnosis present

## 2017-08-15 DIAGNOSIS — K625 Hemorrhage of anus and rectum: Secondary | ICD-10-CM | POA: Diagnosis not present

## 2017-08-15 DIAGNOSIS — I10 Essential (primary) hypertension: Secondary | ICD-10-CM | POA: Diagnosis not present

## 2017-08-15 DIAGNOSIS — F17220 Nicotine dependence, chewing tobacco, uncomplicated: Secondary | ICD-10-CM | POA: Insufficient documentation

## 2017-08-15 LAB — COMPREHENSIVE METABOLIC PANEL
ALBUMIN: 3.8 g/dL (ref 3.5–5.0)
ALK PHOS: 51 U/L (ref 38–126)
ALT: 10 U/L — AB (ref 17–63)
AST: 25 U/L (ref 15–41)
Anion gap: 7 (ref 5–15)
BILIRUBIN TOTAL: 0.6 mg/dL (ref 0.3–1.2)
BUN: 31 mg/dL — ABNORMAL HIGH (ref 6–20)
CALCIUM: 8.7 mg/dL — AB (ref 8.9–10.3)
CO2: 25 mmol/L (ref 22–32)
CREATININE: 1.09 mg/dL (ref 0.61–1.24)
Chloride: 107 mmol/L (ref 101–111)
GFR calc non Af Amer: >60 mL/min (ref >60)
GLUCOSE: 129 mg/dL — AB (ref 65–99)
Potassium: 4.6 mmol/L (ref 3.5–5.1)
SODIUM: 139 mmol/L (ref 135–145)
TOTAL PROTEIN: 7 g/dL (ref 6.5–8.1)

## 2017-08-15 LAB — TYPE AND SCREEN
ABO/RH(D): A NEG
Antibody Screen: NEGATIVE

## 2017-08-15 LAB — HEMOGLOBIN AND HEMATOCRIT, BLOOD
HEMATOCRIT: 44.3 % (ref 40.0–52.0)
Hemoglobin: 14.9 g/dL (ref 13.0–18.0)

## 2017-08-15 LAB — CBC
HEMATOCRIT: 47.1 % (ref 40.0–52.0)
HEMOGLOBIN: 15.5 g/dL (ref 13.0–18.0)
MCH: 28.9 pg (ref 26.0–34.0)
MCHC: 32.9 g/dL (ref 32.0–36.0)
MCV: 87.8 fL (ref 80.0–100.0)
Platelets: 195 10*3/uL (ref 150–440)
RBC: 5.36 MIL/uL (ref 4.40–5.90)
RDW: 14.5 % (ref 11.5–14.5)
WBC: 9.8 10*3/uL (ref 3.8–10.6)

## 2017-08-15 LAB — LIPASE, BLOOD: Lipase: 38 U/L (ref 11–51)

## 2017-08-15 MED ORDER — IOPAMIDOL (ISOVUE-370) INJECTION 76%
100.0000 mL | Freq: Once | INTRAVENOUS | Status: AC | PRN
  Administered 2017-08-15: 100 mL via INTRAVENOUS

## 2017-08-15 MED ORDER — SODIUM CHLORIDE 0.9 % IV BOLUS (SEPSIS)
500.0000 mL | Freq: Once | INTRAVENOUS | Status: AC
  Administered 2017-08-15: 500 mL via INTRAVENOUS

## 2017-08-15 NOTE — ED Provider Notes (Signed)
I do not see significant evidence of vascular disease on CT angiogram.   Earleen Newport, MD 08/15/17 [phone]

## 2017-08-15 NOTE — ED Triage Notes (Signed)
Patient ambulatory to triage with steady gait, without difficulty or distress noted; pt reports rectal bleeding since 2am with lower abd pain; denies hx of same

## 2017-08-15 NOTE — ED Notes (Addendum)
Pt states that 2:00am this morning he had bad stomach pains and diarrhea. States he went to work and had more stomach pains, but this time he passed some blood. States that pain comes and goes in the lower abdominal region and he will feel like he has to go to the bathroom.

## 2017-08-15 NOTE — ED Provider Notes (Signed)
patient has not had any further bleeding and his hemoglobin is stable. He is advised to return if he passes any significant blood and to have close outpatient follow-up with his GI doctor.   Earleen Newport, MD 08/15/17 1045

## 2017-08-15 NOTE — ED Provider Notes (Addendum)
[facility] Emergency Department Provider Note   ____________________________________________   First MD Initiated Contact with Patient 08/15/17 [phone]     (approximate)  I have reviewed the triage vital signs and the nursing notes.   HISTORY  Chief Complaint Abdominal Pain and Rectal Bleeding    HPI OBE AHLERS is a 61 y.o. male previous history of polyp removal, Nissen fundoplication, hypertension  Patient reports that he got up about 2:00 this morning and urge to defecate and a sharp discomfort in his lower abdomen. He had a loose stool, then got up and went to work at about 4:30 this morning while at work he had return of a sharp pain that lasts about 1-2 minutes his lower abdomen. Thereafter he had another bowel movement, but reports it was mixed with dark stool and small amounts of blood and a couple of what he believes clots that fluid to the bottom available.  He's had a few episodes of sharp discomfort in the lower abdomen. Right now, he reports his pain and symptoms have gone away. His last bowel movement was about 4:30 this morning. He takes no blood thinners except for occasionally he will take an aspirin pill every once in a while.  Denies previous history of GI bleeding. Has had a polyp in the past which was removed a few years ago.   Past Medical History:  Diagnosis Date  . Arthritis    right shoulder  . Enlarged prostate   . GERD (gastroesophageal reflux disease)   . Hemorrhoid   . Hypertension    controlled on med  . Neck pain    "EXPERIENCE CRICK IN NECK"  . Neuromuscular disorder ([place])    hands are tingly and go numb  . Pre-diabetes    A1C IN JULY 2017 WAS 6.2           Patient Active Problem List   Diagnosis Date Noted  . Umbilical hernia without obstruction and without gangrene 08/24/2016  . Obesity 02/28/2016  . Depression 02/28/2016  . Parotiditis 02/14/2016  . Left leg swelling 02/14/2016  . Excessive drinking  alcohol 02/14/2016  . Tachycardia 02/14/2016  . Blood in stool   . Other specified diseases of esophagus   . Gastritis   . Benign neoplasm of cecum   . Cellulitis and abscess of oral soft tissues   . Benign neoplasm of sigmoid colon   . 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 Essential (primary) hypertension 05/24/2015  . Brash 05/24/2015  . History of colon polyps 05/24/2015  . Arthralgia of shoulder 05/24/2015  . Insomnia 05/24/2015  . Primary osteoarthritis of both shoulders 04/27/2015  . Carpal tunnel syndrome 04/27/2015    Past Surgical History:  Procedure Laterality Date  . ABDOMINAL SURGERY    . BUNIONECTOMY Left   . COLONOSCOPY WITH PROPOFOL N/A 10/18/2015   Procedure: COLONOSCOPY WITH PROPOFOL;  Surgeon: Lucilla Lame, MD;  Location: [place];  Service: Endoscopy;  Laterality: N/A;  . ESOPHAGOGASTRODUODENOSCOPY (EGD) WITH PROPOFOL N/A 10/18/2015   Procedure: ESOPHAGOGASTRODUODENOSCOPY (EGD) WITH PROPOFOL;  Surgeon: Lucilla Lame, MD;  Location: [place];  Service: Endoscopy;  Laterality: N/A;  wants as early as possible  . HERNIA REPAIR    . NISSEN FUNDOPLICATION    . SHOULDER SURGERY  04/2016   Right shoulder replacement  . UMBILICAL HERNIA REPAIR N/A 09/12/2016   Procedure: HERNIA REPAIR UMBILICAL ADULT;  Surgeon: Robert Bellow, MD;  Location: [facility];  Service: General;  Laterality:  N/A;  . WRIST SURGERY Right    shoulder    Prior to Admission medications   Medication Sig Start Date End Date Taking? Authorizing Provider  ANDRODERM 4 MG/24HR PT24 patch 1 patch. Twice a week 09/22/15   [provider]  finasteride (PROSCAR) 5 MG tablet Take 5 mg by mouth daily with lunch.     [provider]  metoprolol succinate (TOPROL-XL) 25 MG 24 hr tablet Take 1 tablet (25 mg total) by mouth 2 (two) times daily. 03/15/16   Arlis Porta., MD  Misc Natural Products (OSTEO BI-FLEX JOINT SHIELD PO) Take 1 tablet by mouth daily.      [provider]  Multiple Vitamins-Minerals (CENTRUM SILVER PO) Take by mouth.    [provider]  Omega-3 Fatty Acids (FISH OIL CONCENTRATE PO) Take 1 tablet by mouth daily. Daily     [provider]  tamsulosin (FLOMAX) 0.4 MG CAPS capsule Take 0.4 mg by mouth daily with lunch.     [provider]    Allergies Sulfa antibiotics and Celebrex [celecoxib]  Family History  Problem Relation Age of Onset  . Cancer Other   . COPD Mother   . Arthritis Maternal Grandmother   . Cancer Maternal Grandmother   . Diabetes Maternal Grandmother     Social History Social History  Substance Use Topics  . Smoking status: Former Smoker    Packs/day: 1.00    Years: 30.00    Types: Cigarettes    Quit date: 11/28/1995  . Smokeless tobacco: Current User    Types: Chew  . Alcohol use 1.8 oz/week    3 Cans of beer per week     Comment: 3 BEERS QD    Review of Systems Constitutional: No fever/chills Eyes: No visual changes ENT: No sore throat. Cardiovascular: Denies chest pain. Respiratory: Denies shortness of breath. Gastrointestinal:   No nausea, no vomiting.    No constipation. Genitourinary: Negative for dysuria. Musculoskeletal: Negative for back pain. Skin: Negative for rash. Neurological: Negative for headaches, focal weakness or numbness. `   ____________________________________________   PHYSICAL EXAM:  VITAL SIGNS: ED Triage Vitals  Enc Vitals Group     BP 08/15/17 0629 (!) 147/100     Pulse Rate 08/15/17 0629 (!) 114     Resp 08/15/17 0629 18     Temp 08/15/17 0629 97.9 F (36.6 C)     Temp Source 08/15/17 0629 Oral     SpO2 08/15/17 0629 97 %     Weight 08/15/17 0628 260 lb (117.9 kg)     Height 08/15/17 0628 5\' 11"  (1.803 m)     Head Circumference --      Peak Flow --      Pain Score 08/15/17 0628 5     Pain Loc --      Pain Edu? --      Excl. in [place]? --     Constitutional: Alert and oriented. Well appearing and in no acute  distress. Eyes: Conjunctivae are normal. Head: Atraumatic. Nose: No congestion/rhinnorhea. Mouth/Throat: Mucous membranes are moist. Neck: No stridor.   Cardiovascular: Normal rate, regular rhythm. Grossly normal heart sounds.  Good peripheral circulation. Respiratory: Normal respiratory effort.  No retractions. Lungs CTAB. Gastrointestinal: Soft and nontender. No distention.patient denies any abdominal pain at this time. Normal circumcised penis, no groin pain or masses noted. No testicular tenderness. rectal exam performed, no stool is noted in the vault at this time. No stool was able to be  obtained. No gross blood noted. Musculoskeletal: No lower extremity tenderness nor edema. Neurologic:  Normal speech and language. No gross focal neurologic deficits are appreciated.  Skin:  Skin is warm, dry and intact. No rash noted. Psychiatric: Mood and affect are normal. Speech and behavior are normal.  ____________________________________________   LABS (all labs ordered are listed, but only abnormal results are displayed)  Labs Reviewed  CBC  COMPREHENSIVE METABOLIC PANEL  LIPASE, BLOOD  HEMOGLOBIN AND HEMATOCRIT, BLOOD  TYPE AND SCREEN   ____________________________________________  EKG  reviewed and interpreted by me at 1720 Heart rate 100 QRS is 100 QTc 4:30 borderline sinus tachycardia, no evidence of acute ischemia ____________________________________________  RADIOLOGY  CT pending ____________________________________________   PROCEDURES  Procedure(s) performed: None  Procedures  Critical Care performed: No  ____________________________________________   INITIAL IMPRESSION / ASSESSMENT AND PLAN / ED COURSE  Pertinent labs & imaging results that were available during my care of the patient were reviewed by me and considered in my medical decision making (see chart for details).  Differential diagnosis includes but is not limited to, abdominal perforation,  aortic dissection, cholecystitis, appendicitis, diverticulitis, colitis, esophagitis/gastritis, kidney stone, pyelonephritis, urinary tract infection, aortic aneurysm. All are considered in decision and treatment plan. Based upon the patient's presentation and risk factors, based on the patient's intermittent sharp lower pain as well as loose now reported bloody stools, order CT scan with angiography to evaluate for etiology including vascular and which I am's concern about the possibility of colitis, possibly an ischemic nature. No symptoms suggestive AAA, dissection, but the differential is certainly high for vascular etiology such as ischemia, diverticulitis, colitis, and less likely polyp or mass.  ----------------------------------------- 7:36 AM on 08/15/2017 -----------------------------------------  Patient remains asymptomatic. Ongoing care assigned to Dr. Jimmye Norman. Follow up on pending labs as well as CT scan. Likely recheck hemoglobin and reassess for stability if no acute findings. Patient continues to feel well with no further bowel movements and stable hemoglobins, likely appropriate for outpatient management with close return precautions.       ____________________________________________   FINAL CLINICAL IMPRESSION(S) / ED DIAGNOSES  Final diagnoses:  Lower abdominal pain  Rectal bleeding      NEW MEDICATIONS STARTED DURING THIS VISIT:  New Prescriptions   No medications on file     Note:  This document was prepared using Dragon voice recognition software and may include unintentional dictation errors.     Delman Kitten, MD 08/15/17 Yehuda Mao    Delman Kitten, MD 11/01/17 [phone]

## 2017-08-29 ENCOUNTER — Ambulatory Visit: Payer: 59 | Admitting: Gastroenterology

## 2017-09-06 ENCOUNTER — Ambulatory Visit: Payer: 59 | Admitting: Gastroenterology

## 2017-10-24 DIAGNOSIS — K2970 Gastritis, unspecified, without bleeding: Secondary | ICD-10-CM

## 2017-10-24 HISTORY — DX: Gastritis, unspecified, without bleeding: K29.70

## 2017-12-15 ENCOUNTER — Other Ambulatory Visit: Payer: Self-pay | Admitting: Orthopedic Surgery

## 2017-12-15 DIAGNOSIS — M25561 Pain in right knee: Secondary | ICD-10-CM

## 2017-12-21 ENCOUNTER — Ambulatory Visit
Admission: RE | Admit: 2017-12-21 | Discharge: 2017-12-21 | Disposition: A | Discharge status: Home / Self Care | Payer: BLUE CROSS/BLUE SHIELD | Source: Ambulatory Visit | Attending: Orthopedic Surgery | Admitting: Orthopedic Surgery

## 2017-12-21 DIAGNOSIS — M25561 Pain in right knee: Secondary | ICD-10-CM

## 2018-01-25 DIAGNOSIS — I82409 Acute embolism and thrombosis of unspecified deep veins of unspecified lower extremity: Secondary | ICD-10-CM

## 2018-01-25 HISTORY — DX: Acute embolism and thrombosis of unspecified deep veins of unspecified lower extremity: I82.409

## 2018-02-14 ENCOUNTER — Other Ambulatory Visit: Payer: Self-pay

## 2018-02-14 ENCOUNTER — Encounter
Admission: RE | Admit: 2018-02-14 | Discharge: 2018-02-14 | Disposition: A | Discharge status: Home / Self Care | Payer: BLUE CROSS/BLUE SHIELD | Source: Ambulatory Visit | Attending: Orthopedic Surgery | Admitting: Orthopedic Surgery

## 2018-02-14 DIAGNOSIS — Z01818 Encounter for other preprocedural examination: Secondary | ICD-10-CM | POA: Insufficient documentation

## 2018-02-14 NOTE — Patient Instructions (Addendum)
Your procedure is scheduled AT:[facility], MARCH 25  Report to [facility]  To find out your arrival time please call [phone] between               1PM - 3PM on Friday, MARCH 22  Remember: Instructions that are not followed completely may result in serious  medical risk, up to and including death, or upon the discretion of your surgeon  and anesthesiologist your surgery may need to be rescheduled.     _X__ 1. Do not eat food after midnight the night before your procedure.                 No gum chewing, lozengers or hard candies.                  You may drink clear liquids up to 2 hours                 before you are scheduled to arrive for your surgery-                  DO not drink clear liquids within 2 hours of the start of your surgery.                  Clear Liquids include:  water, apple juice without pulp, clear carbohydrate                 drink such as Clearfast of Gatorade, Black Coffee or Tea (Do not add                 anything to coffee or tea).  __X__2.  On the morning of surgery brush your teeth with toothpaste and water,                            you may rinse your mouth with mouthwash if you wish.                                Do not swallow any toothpaste of mouthwash.     _X__ 3.  No Alcohol for 24 hours before or after surgery.   _X__ 4.  Do Not Smoke or use e-cigarettes For 24 Hours Prior to Your Surgery.                 Do not use any chewable tobacco products for at least 6 hours prior to                 surgery.  ____  5.  Bring all medications with you on the day of surgery if instructed.   ____  6.  Notify your doctor if there is any change in your medical condition      (cold, fever, infections).     Do not wear jewelry, make-up, hairpins, clips or nail polish. Do not wear lotions, powders, or perfumes. You may wear deodorant. Do not shave 48 hours prior to surgery. Men may shave face and  neck. Do not bring valuables to the hospital.    [facility] is not responsible for any belongings or valuables.  Contacts, dentures or bridgework may not be worn into surgery. Leave your suitcase in the car. After surgery it may be brought to your room. For patients admitted to the hospital, discharge time is determined by your treatment  team.   Patients discharged the day of surgery will not be allowed to drive home.   Please read over the following fact sheets that you were given:   PREPARING FOR SURGERY   ____ Take these medicines the morning of surgery with A SIP OF WATER:    1. METOPROLOL  2.  3.   4.   5.  6.  ____ Fleet Enema (as directed)   __X__ Use CHG Soap as directed  ____ Use inhalers on the day of surgery  _X___ Stop aspirin NOW!!  _X___ Stop Anti-inflammatories NOW!!     TYLENOL IS OKAY   _X___ Stop supplements until after surgery.                THIS INCLUDES MULTIVITAMINS, MELATONIN  ____ Bring C-Pap to the hospital.   WEAR LOOSE FITTING PANTS, OR SHORTS  CONTINUE ALL PRESCRIPTION MEDICATIONS AS USUAL  STOOL SOFTENERS FOR POST OP

## 2018-02-14 NOTE — Pre-Procedure Instructions (Signed)
Patient has 2 small sores on his right knee that were bug bites that he has scratched.  There is no drainage but one of them is red around the circumference of the site. Reviewed with patient not to scratch any sore or open areas prior to surgery. He also usually takes antibiotics before dental appointments due to his previous shoulder arthroplasty but does not have any antibiotics to take prior to this knee scope.

## 2018-02-18 ENCOUNTER — Encounter: Payer: Self-pay | Admitting: Orthopedic Surgery

## 2018-02-18 ENCOUNTER — Ambulatory Visit
Admission: RE | Admit: 2018-02-18 | Discharge: 2018-02-18 | Disposition: A | Discharge status: Home / Self Care | Payer: BLUE CROSS/BLUE SHIELD | Source: Ambulatory Visit | Attending: Orthopedic Surgery | Admitting: Orthopedic Surgery

## 2018-02-18 ENCOUNTER — Encounter
Admission: RE | Disposition: A | Discharge status: Home / Self Care | Payer: Self-pay | Source: Ambulatory Visit | Attending: Orthopedic Surgery

## 2018-02-18 ENCOUNTER — Inpatient Hospital Stay: Payer: BLUE CROSS/BLUE SHIELD | Admitting: Registered Nurse

## 2018-02-18 ENCOUNTER — Other Ambulatory Visit: Payer: Self-pay

## 2018-02-18 DIAGNOSIS — Z79899 Other long term (current) drug therapy: Secondary | ICD-10-CM | POA: Diagnosis not present

## 2018-02-18 DIAGNOSIS — X58XXXA Exposure to other specified factors, initial encounter: Secondary | ICD-10-CM | POA: Diagnosis not present

## 2018-02-18 DIAGNOSIS — S83241A Other tear of medial meniscus, current injury, right knee, initial encounter: Secondary | ICD-10-CM | POA: Diagnosis not present

## 2018-02-18 DIAGNOSIS — Z79890 Hormone replacement therapy (postmenopausal): Secondary | ICD-10-CM | POA: Diagnosis not present

## 2018-02-18 DIAGNOSIS — Z9889 Other specified postprocedural states: Secondary | ICD-10-CM

## 2018-02-18 DIAGNOSIS — M94261 Chondromalacia, right knee: Secondary | ICD-10-CM | POA: Diagnosis not present

## 2018-02-18 DIAGNOSIS — M2391 Unspecified internal derangement of right knee: Secondary | ICD-10-CM | POA: Diagnosis present

## 2018-02-18 DIAGNOSIS — F17220 Nicotine dependence, chewing tobacco, uncomplicated: Secondary | ICD-10-CM | POA: Insufficient documentation

## 2018-02-18 DIAGNOSIS — S83281A Other tear of lateral meniscus, current injury, right knee, initial encounter: Secondary | ICD-10-CM | POA: Diagnosis not present

## 2018-02-18 DIAGNOSIS — I10 Essential (primary) hypertension: Secondary | ICD-10-CM | POA: Diagnosis not present

## 2018-02-18 HISTORY — PX: KNEE ARTHROSCOPY: SHX127

## 2018-02-18 HISTORY — PX: KNEE ARTHROSCOPY WITH LATERAL MENISECTOMY: SHX6193

## 2018-02-18 HISTORY — PX: KNEE ARTHROSCOPY WITH MEDIAL MENISECTOMY: SHX5651

## 2018-02-18 LAB — GLUCOSE, CAPILLARY: Glucose-Capillary: 98 mg/dL (ref 65–99)

## 2018-02-18 SURGERY — ARTHROSCOPY, KNEE
Anesthesia: General | Site: Knee | Laterality: Right

## 2018-02-18 MED ORDER — HYDROCODONE-ACETAMINOPHEN 5-325 MG PO TABS
ORAL_TABLET | ORAL | Status: AC
  Filled 2018-02-18: qty 2

## 2018-02-18 MED ORDER — SODIUM CHLORIDE 0.9 % IV SOLN
INTRAVENOUS | Status: DC | PRN
  Administered 2018-02-18: 80 ug/min via INTRAVENOUS

## 2018-02-18 MED ORDER — ONDANSETRON HCL 4 MG/2ML IJ SOLN
INTRAMUSCULAR | Status: DC | PRN
  Administered 2018-02-18: 4 mg via INTRAVENOUS

## 2018-02-18 MED ORDER — ACETAMINOPHEN 10 MG/ML IV SOLN
INTRAVENOUS | Status: AC
  Filled 2018-02-18: qty 100

## 2018-02-18 MED ORDER — HYDROCODONE-ACETAMINOPHEN 5-325 MG PO TABS
1.0000 | ORAL_TABLET | Freq: Four times a day (QID) | ORAL | Status: DC | PRN
  Administered 2018-02-18: 2 via ORAL

## 2018-02-18 MED ORDER — FENTANYL CITRATE (PF) 100 MCG/2ML IJ SOLN
INTRAMUSCULAR | Status: AC
  Administered 2018-02-18: 25 ug via INTRAVENOUS
  Filled 2018-02-18: qty 2

## 2018-02-18 MED ORDER — KETAMINE HCL 50 MG/ML IJ SOLN
INTRAMUSCULAR | Status: DC | PRN
  Administered 2018-02-18: 50 mg via INTRAMUSCULAR

## 2018-02-18 MED ORDER — DEXAMETHASONE SODIUM PHOSPHATE 10 MG/ML IJ SOLN
INTRAMUSCULAR | Status: DC | PRN
Start: 2018-02-18 — End: 2018-02-18
  Administered 2018-02-18: 4 mg via INTRAVENOUS

## 2018-02-18 MED ORDER — BUPIVACAINE-EPINEPHRINE (PF) 0.25% -1:200000 IJ SOLN
INTRAMUSCULAR | Status: AC
  Filled 2018-02-18: qty 30

## 2018-02-18 MED ORDER — KETAMINE HCL 50 MG/ML IJ SOLN
INTRAMUSCULAR | Status: AC
  Filled 2018-02-18: qty 10

## 2018-02-18 MED ORDER — FENTANYL CITRATE (PF) 100 MCG/2ML IJ SOLN
INTRAMUSCULAR | Status: AC
  Filled 2018-02-18: qty 2

## 2018-02-18 MED ORDER — HYDROCODONE-ACETAMINOPHEN 5-325 MG PO TABS: 1.0000 | ORAL_TABLET | ORAL | 0 refills | Status: DC | PRN

## 2018-02-18 MED ORDER — ACETAMINOPHEN 10 MG/ML IV SOLN
INTRAVENOUS | Status: DC | PRN
  Administered 2018-02-18: 1000 mg via INTRAVENOUS

## 2018-02-18 MED ORDER — PROPOFOL 10 MG/ML IV BOLUS
INTRAVENOUS | Status: AC
  Filled 2018-02-18: qty 40

## 2018-02-18 MED ORDER — LIDOCAINE HCL (CARDIAC) 20 MG/ML IV SOLN
INTRAVENOUS | Status: DC | PRN
Start: 2018-02-18 — End: 2018-02-18
  Administered 2018-02-18: 100 mg via INTRAVENOUS

## 2018-02-18 MED ORDER — ONDANSETRON HCL 4 MG/2ML IJ SOLN: 4.0000 mg | Freq: Once | INTRAMUSCULAR | Status: DC | PRN

## 2018-02-18 MED ORDER — MORPHINE SULFATE 4 MG/ML IJ SOLN
INTRAMUSCULAR | Status: DC | PRN
  Administered 2018-02-18: 4 mg via INTRAVENOUS

## 2018-02-18 MED ORDER — SODIUM CHLORIDE 0.9 % IV SOLN
INTRAVENOUS | Status: DC
  Administered 2018-02-18: 100 mL/h via INTRAVENOUS
  Administered 2018-02-18: 16:00:00 via INTRAVENOUS

## 2018-02-18 MED ORDER — BUPIVACAINE-EPINEPHRINE 0.25% -1:200000 IJ SOLN
INTRAMUSCULAR | Status: DC | PRN
  Administered 2018-02-18: 30 mL

## 2018-02-18 MED ORDER — FENTANYL CITRATE (PF) 100 MCG/2ML IJ SOLN
INTRAMUSCULAR | Status: DC | PRN
  Administered 2018-02-18 (×2): 50 ug via INTRAVENOUS

## 2018-02-18 MED ORDER — MIDAZOLAM HCL 2 MG/2ML IJ SOLN
INTRAMUSCULAR | Status: DC | PRN
  Administered 2018-02-18: 2 mg via INTRAVENOUS

## 2018-02-18 MED ORDER — CEFAZOLIN SODIUM 1 G IJ SOLR
INTRAMUSCULAR | Status: AC
  Filled 2018-02-18: qty 20

## 2018-02-18 MED ORDER — LIDOCAINE HCL (PF) 2 % IJ SOLN
INTRAMUSCULAR | Status: AC
  Filled 2018-02-18: qty 10

## 2018-02-18 MED ORDER — FAMOTIDINE 20 MG PO TABS: 20.0000 mg | ORAL_TABLET | Freq: Once | ORAL | Status: DC

## 2018-02-18 MED ORDER — MORPHINE SULFATE (PF) 4 MG/ML IV SOLN
INTRAVENOUS | Status: AC
  Filled 2018-02-18: qty 1

## 2018-02-18 MED ORDER — PHENYLEPHRINE HCL 10 MG/ML IJ SOLN
INTRAMUSCULAR | Status: DC | PRN
Start: 2018-02-18 — End: 2018-02-18
  Administered 2018-02-18 (×2): 200 ug via INTRAVENOUS
  Administered 2018-02-18: 100 ug via INTRAVENOUS
  Administered 2018-02-18 (×3): 200 ug via INTRAVENOUS
  Administered 2018-02-18: 100 ug via INTRAVENOUS
  Administered 2018-02-18: 200 ug via INTRAVENOUS
  Administered 2018-02-18: 100 ug via INTRAVENOUS
  Administered 2018-02-18: 300 ug via INTRAVENOUS
  Administered 2018-02-18: 200 ug via INTRAVENOUS

## 2018-02-18 MED ORDER — MIDAZOLAM HCL 2 MG/2ML IJ SOLN
INTRAMUSCULAR | Status: AC
  Filled 2018-02-18: qty 2

## 2018-02-18 MED ORDER — FENTANYL CITRATE (PF) 100 MCG/2ML IJ SOLN
25.0000 ug | INTRAMUSCULAR | Status: DC | PRN
  Administered 2018-02-18 (×4): 25 ug via INTRAVENOUS

## 2018-02-18 MED ORDER — PROPOFOL 10 MG/ML IV BOLUS
INTRAVENOUS | Status: DC | PRN
  Administered 2018-02-18: 200 mg via INTRAVENOUS
  Administered 2018-02-18: 100 mg via INTRAVENOUS

## 2018-02-18 MED ORDER — CEFAZOLIN SODIUM-DEXTROSE 2-3 GM-%(50ML) IV SOLR
INTRAVENOUS | Status: DC | PRN
  Administered 2018-02-18: 2 g via INTRAVENOUS

## 2018-02-18 SURGICAL SUPPLY — 22 items
BLADE SHAVER 4.5 DBL SERAT CV (CUTTER) ×1 IMPLANT
CUFF TOURN 24 STER (MISCELLANEOUS) IMPLANT
CUFF TOURN 30 STER DUAL PORT (MISCELLANEOUS) ×1 IMPLANT
DRSG DERMACEA 8X12 NADH (GAUZE/BANDAGES/DRESSINGS) ×3 IMPLANT
DURAPREP 26ML APPLICATOR (WOUND CARE) ×6 IMPLANT
GAUZE SPONGE 4X4 12PLY STRL (GAUZE/BANDAGES/DRESSINGS) ×3 IMPLANT
GLOVE BIOGEL M STRL SZ7.5 (GLOVE) ×3 IMPLANT
GLOVE INDICATOR 8.0 STRL GRN (GLOVE) ×3 IMPLANT
GOWN STRL REUS W/ TWL LRG LVL3 (GOWN DISPOSABLE) ×4 IMPLANT
GOWN STRL REUS W/TWL LRG LVL3 (GOWN DISPOSABLE) ×6
IV LACTATED RINGER IRRG 3000ML (IV SOLUTION) ×18
IV LR IRRIG 3000ML ARTHROMATIC (IV SOLUTION) ×12 IMPLANT
KIT TURNOVER KIT A (KITS) ×3 IMPLANT
MANIFOLD NEPTUNE II (INSTRUMENTS) ×3 IMPLANT
PACK ARTHROSCOPY KNEE (MISCELLANEOUS) ×3 IMPLANT
SET TUBE SUCT SHAVER OUTFL 24K (TUBING) ×3 IMPLANT
SET TUBE TIP INTRA-ARTICULAR (MISCELLANEOUS) ×3 IMPLANT
SUT ETHILON 3-0 FS-10 30 BLK (SUTURE) ×3
SUTURE EHLN 3-0 FS-10 30 BLK (SUTURE) ×2 IMPLANT
TUBING ARTHRO INFLOW-ONLY STRL (TUBING) ×3 IMPLANT
WAND HAND CNTRL MULTIVAC 50 (MISCELLANEOUS) ×3 IMPLANT
WRAP KNEE W/COLD PACKS 25.5X14 (SOFTGOODS) ×3 IMPLANT

## 2018-02-18 NOTE — Transfer of Care (Signed)
Immediate Anesthesia Transfer of Care Note  Patient: [name]  Procedure(s) Performed: Procedure(s): ARTHROSCOPY KNEE (Right) KNEE ARTHROSCOPY WITH LATERAL MENISECTOMY (Right) KNEE ARTHROSCOPY WITH MEDIAL MENISECTOMY (Right)  Patient Location: PACU  Anesthesia Type:General  Level of Consciousness: sedated  Airway & Oxygen Therapy: Patient Spontanous Breathing and Patient connected to face mask oxygen  Post-op Assessment: Report given to RN and Post -op Vital signs reviewed and stable  Post vital signs: Reviewed and stable  Last Vitals:  Vitals:   02/18/18 1348 02/18/18 1611  BP: (!) 140/95 124/82  Pulse: (!) 114 95  Resp: 14 16  Temp: 36.4 C (P) 36.6 C  SpO2: 67% 59%    Complications: No apparent anesthesia complications

## 2018-02-18 NOTE — Anesthesia Post-op Follow-up Note (Signed)
Anesthesia QCDR form completed.        

## 2018-02-18 NOTE — H&P (Signed)
The patient has been re-examined, and the chart reviewed, and there have been no interval changes to the documented history and physical.    The risks, benefits, and alternatives have been discussed at length. The patient expressed understanding of the risks benefits and agreed with plans for surgical intervention.  Martiza Speth P. Warrene Kapfer, Jr. M.D.    

## 2018-02-18 NOTE — Discharge Instructions (Signed)
°  Instructions after Knee Arthroscopy  ° ° Eleazar Kimmey P. Calea Hribar, Jr., M.D.    ° Dept. of Orthopaedics & Sports Medicine ° [facility] ° [address] ° [place], [place]  [address] ° ° Phone: [phone]   Fax: [phone] ° ° °DIET: °• Drink plenty of non-alcoholic fluids & begin a light diet. °• Resume your normal diet the day after surgery. ° °ACTIVITY:  °• You may use crutches or a walker with weight-bearing as tolerated, unless instructed otherwise. °• You may wean yourself off of the walker or crutches as tolerated.  °• Begin doing gentle exercises. Exercising will reduce the pain and swelling, increase motion, and prevent muscle weakness.   °• Avoid strenuous activities or athletics for a minimum of 4-6 weeks after arthroscopic surgery. °• Do not drive or operate any equipment until instructed. ° °WOUND CARE:  °• Place one to two pillows under the knee the first day or two when sitting or lying.  °• Continue to use the ice packs periodically to reduce pain and swelling. °• The small incisions in your knee are closed with nylon stitches. The stitches will be removed in the office. °• The bulky dressing may be removed on the second day after surgery. DO NOT TOUCH THE STITCHES. Put a Band-Aid over each stitch. Do NOT use any ointments or creams on the incisions.  °• You may bathe or shower after the stitches are removed at the first office visit following surgery. ° °MEDICATIONS: °• You may resume your regular medications. °• Please take the pain medication as prescribed. °• Do not take pain medication on an empty stomach. °• Do not drive or drink alcoholic beverages when taking pain medications. ° °CALL THE OFFICE FOR: °• Temperature above 101 degrees °• Excessive bleeding or drainage on the dressing. °• Excessive swelling, coldness, or paleness of the toes. °• Persistent nausea and vomiting. ° °FOLLOW-UP:  °• You should have an appointment to return to the office in 7-10 days after surgery.  °  °

## 2018-02-18 NOTE — Anesthesia Preprocedure Evaluation (Signed)
Anesthesia Evaluation  Patient identified by MRN, date of birth, ID band Patient awake    Reviewed: Allergy & Precautions, NPO status , Patient's Chart, lab work & pertinent test results  History of Anesthesia Complications Negative for: history of anesthetic complications  Airway Mallampati: II       Dental   Pulmonary neg sleep apnea, neg COPD, former smoker,           Cardiovascular hypertension, Pt. on medications and Pt. on home beta blockers (-) Past MI and (-) CHF (-) dysrhythmias (-) Valvular Problems/Murmurs     Neuro/Psych neg Seizures Depression    GI/Hepatic Neg liver ROS, GERD (s/p Nisin)  ,  Endo/Other  neg diabetes  Renal/GU negative Renal ROS     Musculoskeletal   Abdominal   Peds  Hematology   Anesthesia Other Findings   Reproductive/Obstetrics                            Anesthesia Physical Anesthesia Plan  ASA: III  Anesthesia Plan: General   Post-op Pain Management:    Induction: Intravenous  PONV Risk Score and Plan: 3 and Dexamethasone and Ondansetron  Airway Management Planned: LMA  Additional Equipment:   Intra-op Plan:   Post-operative Plan:   Informed Consent: I have reviewed the patients History and Physical, chart, labs and discussed the procedure including the risks, benefits and alternatives for the proposed anesthesia with the patient or authorized representative who has indicated his/her understanding and acceptance.     Plan Discussed with:   Anesthesia Plan Comments:         Anesthesia Quick Evaluation

## 2018-02-18 NOTE — Op Note (Signed)
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   ASSISTANT: none  ANESTHESIA: general  ESTIMATED BLOOD LOSS: Minimal  FLUIDS REPLACED: 1000 mL of crystalloid  TOURNIQUET TIME: Not used  DRAINS: none  IMPLANTS UTILIZED: None  INDICATIONS FOR SURGERY: Louis Salazar is a 62 y.o. year old male who has been seen for complaints of right knee pain.  Clinical findings were consistent with meniscal pathology. After discussion of the risks and benefits of surgical intervention, the patient expressed understanding of the risks benefits and agree with plans for right knee arthroscopy.   PROCEDURE IN DETAIL: The patient was brought into the operating room and, after adequate general anesthesia was achieved, a tourniquet was applied to the right thigh and the leg was placed in the leg holder. All bony prominences were well padded. The patient's right knee was cleaned and prepped with alcohol and Duraprep and draped in the usual sterile fashion. A "timeout" was performed as per usual protocol. The anticipated portal sites were injected with 0.25% Marcaine with epinephrine. An anterolateral incision was made and a cannula was inserted. A large effusion was evacuated and the knee was distended with fluid using the pump. The scope was advanced down the medial gutter into the medial compartment. Under visualization with the scope, an anteromedial portal was created and a hooked probe was inserted. The medial meniscus  was visualized and probed.  There was a tear of the posterior horn of the medial meniscus.  The tear was debrided using combination of meniscal punches and a 4.5 mm incisor shaver.  Final contouring was performed using the 50 degree ArthroCare wand.  The remaining rim of meniscus was visualized and probed felt stable.  The articular cartilage was visualized.  There was an area of localized grade II-III chondromalacia involving the medial femoral condyle.  This area was debrided and contoured using the ArthroCare wand.  The scope was then advanced into the intercondylar notch. The anterior cruciate ligament was visualized and probed and felt to be intact. The scope was removed from the lateral portal and reinserted via the anteromedial portal to better visualize the lateral compartment. The lateral meniscus was visualized and probed.  There was a tear of the posterior horn of the lateral meniscus.  The tear was debrided using meniscal punches and a 4.5 mm shaver.  Final contouring was performed using the 50 degree ArthroCare wand.  The remaining portion of the meniscus was visualized and probed felt be stable. The articular cartilage of the lateral compartment was visualized and felt to be in good condition.  Finally, the scope was advanced so as to visualize the patellofemoral articulation. Good patellar tracking was appreciated.  The articular surface was in good condition.  The knee was irrigated with copius amounts of fluid and suctioned dry. The anterolateral portal was re-approximated with #3-0 nylon. A combination of 0.25% Marcaine with epinephrine and 4 mg of Morphine were injected via the scope. The scope was removed and the anteromedial portal was re-approximated with #3-0 nylon.  A sterile dressing was applied followed by application of an ice wrap.  The patient tolerated the procedure well and was transported to the PACU in stable condition.  Louis Salazar., M.D.

## 2018-02-18 NOTE — Anesthesia Procedure Notes (Signed)
Procedure Name: LMA Insertion Date/Time: 02/18/2018 2:37 PM Performed by: Doreen Salvage, CRNA Pre-anesthesia Checklist: Patient identified, Patient being monitored, Timeout performed, Emergency Drugs available and Suction available Patient Re-evaluated:Patient Re-evaluated prior to induction Oxygen Delivery Method: Circle system utilized Preoxygenation: Pre-oxygenation with 100% oxygen Induction Type: IV induction Ventilation: Mask ventilation without difficulty LMA: LMA inserted LMA Size: 4.5 Tube type: Oral Number of attempts: 1 Placement Confirmation: positive ETCO2 and breath sounds checked- equal and bilateral Tube secured with: Tape Dental Injury: Teeth and Oropharynx as per pre-operative assessment

## 2018-02-19 ENCOUNTER — Encounter: Payer: Self-pay | Admitting: Orthopedic Surgery

## 2018-02-19 NOTE — Anesthesia Postprocedure Evaluation (Signed)
Anesthesia Post Note  Patient: Louis Salazar  Procedure(s) Performed: ARTHROSCOPY KNEE (Right ) KNEE ARTHROSCOPY WITH LATERAL MENISECTOMY (Right Knee) KNEE ARTHROSCOPY WITH MEDIAL MENISECTOMY (Right Knee)  Patient location during evaluation: PACU Anesthesia Type: General Level of consciousness: awake and alert Pain management: pain level controlled Vital Signs Assessment: post-procedure vital signs reviewed and stable Respiratory status: spontaneous breathing and respiratory function stable Cardiovascular status: stable Anesthetic complications: no     Last Vitals:  Vitals:   02/18/18 1736 02/18/18 1743  BP: 128/76 133/76  Pulse: 91   Resp: 16 16  Temp:    SpO2: 96% 97%    Last Pain:  Vitals:   02/18/18 1736  TempSrc:   PainSc: 1                  KEPHART,WILLIAM K

## 2018-03-05 ENCOUNTER — Encounter
Admission: RE | Admit: 2018-03-05 | Discharge: 2018-03-05 | Disposition: A | Discharge status: Home / Self Care | Payer: BLUE CROSS/BLUE SHIELD | Source: Ambulatory Visit | Attending: Orthopedic Surgery | Admitting: Orthopedic Surgery

## 2018-03-05 ENCOUNTER — Other Ambulatory Visit: Payer: Self-pay

## 2018-03-05 NOTE — Patient Instructions (Signed)
Your procedure is scheduled on: 03-12-18 TUESDAY Report to Same Day Surgery 2nd floor medical mall [facility] Entrance-take elevator on left to 2nd floor.  Check in with surgery information desk.) To find out your arrival time please call [phone] between 1PM - 3PM on 03-11-18 MONDAY  Remember: Instructions that are not followed completely may result in serious medical risk, up to and including death, or upon the discretion of your surgeon and anesthesiologist your surgery may need to be rescheduled.    _x___ 1. Do not eat food after midnight the night before your procedure. NO GUM OR CANDY AFTER MIDNIGHT.  You may drink clear liquids up to 2 hours before you are scheduled to arrive at the hospital for your procedure.  Do not drink clear liquids within 2 hours of your scheduled arrival to the hospital.  Clear liquids include  --Water or Apple juice without pulp  --Clear carbohydrate beverage such as ClearFast or Gatorade  --Black Coffee or Clear Tea (No milk, no creamers, do not add anything to the coffee or Tea     __x__ 2. No Alcohol for 24 hours before or after surgery.   __x__3. No Smoking or e-cigarettes for 24 prior to surgery.  Do not use any chewable tobacco products for at least 6 hour prior to surgery   ____  4. Bring all medications with you on the day of surgery if instructed.    __x__ 5. Notify your doctor if there is any change in your medical condition     (cold, fever, infections).    x___6. On the morning of surgery brush your teeth with toothpaste and water.  You may rinse your mouth with mouth wash if you wish.  Do not swallow any toothpaste or mouthwash.   Do not wear jewelry, make-up, hairpins, clips or nail polish.  Do not wear lotions, powders, or perfumes. You may wear deodorant.  Do not shave 48 hours prior to surgery. Men may shave face and neck.  Do not bring valuables to the hospital.    [facility] is not responsible for any belongings or  valuables.               Contacts, dentures or bridgework may not be worn into surgery.  Leave your suitcase in the car. After surgery it may be brought to your room.  For patients admitted to the hospital, discharge time is determined by your treatment team.  _  Patients discharged the day of surgery will not be allowed to drive home.  You will need someone to drive you home and stay with you the night of your procedure.    Please read over the following fact sheets that you were given:   [facility] [place] [facility] for [facility] and or MRSA Information   _x___ TAKE THE FOLLOWING MEDICATION THE MORNING OF SURGERY. These include:  1. METOPROLOL  2.  3.  4.  5.  6.  ____Fleets enema or Magnesium Citrate as directed.   ____ Use CHG Soap or sage wipes as directed on instruction sheet   ____ Use inhalers on the day of surgery and bring to hospital day of surgery  ____ Stop Metformin and Janumet 2 days prior to surgery.    ____ Take 1/2 of usual insulin dose the night before surgery and none on the morning surgery.   ____ Follow recommendations from Cardiologist, Pulmonologist or PCP regarding stopping Aspirin, Coumadin, Plavix ,Eliquis, Effient, or Pradaxa, and Pletal.  X____Stop Anti-inflammatories such as  Advil, Aleve, Ibuprofen, Motrin, Naproxen, Naprosyn, Goodies powders or aspirin products NOW-OK to take Tylenol.   _x___ Stop supplements until after surgery-STOP MELATONIN NOW-MAY RESUME AFTER SURGERY   ____ Bring C-Pap to the hospital.

## 2018-03-11 ENCOUNTER — Encounter: Payer: Self-pay | Admitting: *Deleted

## 2018-03-12 ENCOUNTER — Other Ambulatory Visit: Payer: Self-pay

## 2018-03-12 ENCOUNTER — Ambulatory Visit
Admission: RE | Admit: 2018-03-12 | Discharge: 2018-03-12 | Disposition: A | Discharge status: Home / Self Care | Payer: BLUE CROSS/BLUE SHIELD | Source: Ambulatory Visit | Attending: Orthopedic Surgery | Admitting: Orthopedic Surgery

## 2018-03-12 ENCOUNTER — Encounter: Payer: Self-pay | Admitting: *Deleted

## 2018-03-12 ENCOUNTER — Ambulatory Visit: Payer: BLUE CROSS/BLUE SHIELD | Admitting: Anesthesiology

## 2018-03-12 ENCOUNTER — Encounter
Admission: RE | Disposition: A | Discharge status: Home / Self Care | Payer: Self-pay | Source: Ambulatory Visit | Attending: Orthopedic Surgery

## 2018-03-12 DIAGNOSIS — N400 Enlarged prostate without lower urinary tract symptoms: Secondary | ICD-10-CM | POA: Diagnosis not present

## 2018-03-12 DIAGNOSIS — Z79899 Other long term (current) drug therapy: Secondary | ICD-10-CM | POA: Diagnosis not present

## 2018-03-12 DIAGNOSIS — I10 Essential (primary) hypertension: Secondary | ICD-10-CM | POA: Diagnosis not present

## 2018-03-12 DIAGNOSIS — G5601 Carpal tunnel syndrome, right upper limb: Secondary | ICD-10-CM | POA: Diagnosis not present

## 2018-03-12 DIAGNOSIS — Z87891 Personal history of nicotine dependence: Secondary | ICD-10-CM | POA: Diagnosis not present

## 2018-03-12 DIAGNOSIS — M25531 Pain in right wrist: Secondary | ICD-10-CM | POA: Diagnosis present

## 2018-03-12 HISTORY — PX: CARPAL TUNNEL RELEASE: SHX101

## 2018-03-12 HISTORY — DX: Foreign body on external eye, part unspecified, unspecified eye, initial encounter: T15.90XA

## 2018-03-12 LAB — GLUCOSE, CAPILLARY: Glucose-Capillary: 91 mg/dL (ref 65–99)

## 2018-03-12 LAB — URINE DRUG SCREEN, QUALITATIVE (ARMC ONLY)
AMPHETAMINES, UR SCREEN: NOT DETECTED
BENZODIAZEPINE, UR SCRN: POSITIVE — AB
Barbiturates, Ur Screen: NOT DETECTED
COCAINE METABOLITE, UR ~~LOC~~: NOT DETECTED
Cannabinoid 50 Ng, Ur ~~LOC~~: NOT DETECTED
MDMA (Ecstasy)Ur Screen: NOT DETECTED
Methadone Scn, Ur: NOT DETECTED
OPIATE, UR SCREEN: NOT DETECTED
PHENCYCLIDINE (PCP) UR S: NOT DETECTED
Tricyclic, Ur Screen: NOT DETECTED

## 2018-03-12 SURGERY — CARPAL TUNNEL RELEASE
Anesthesia: General | Laterality: Right | Wound class: Clean

## 2018-03-12 MED ORDER — MIDAZOLAM HCL 2 MG/2ML IJ SOLN
INTRAMUSCULAR | Status: DC | PRN
  Administered 2018-03-12: 4 mg via INTRAVENOUS

## 2018-03-12 MED ORDER — LACTATED RINGERS IV SOLN
INTRAVENOUS | Status: DC | PRN
  Administered 2018-03-12: 16:00:00 via INTRAVENOUS

## 2018-03-12 MED ORDER — LACTATED RINGERS IV SOLN: INTRAVENOUS | Status: DC

## 2018-03-12 MED ORDER — PHENYLEPHRINE HCL 10 MG/ML IJ SOLN
INTRAMUSCULAR | Status: DC | PRN
  Administered 2018-03-12 (×2): 100 ug via INTRAVENOUS

## 2018-03-12 MED ORDER — MIDAZOLAM HCL 2 MG/2ML IJ SOLN
INTRAMUSCULAR | Status: AC
  Filled 2018-03-12: qty 2

## 2018-03-12 MED ORDER — PROMETHAZINE HCL 25 MG/ML IJ SOLN: 6.2500 mg | INTRAMUSCULAR | Status: DC | PRN

## 2018-03-12 MED ORDER — DEXAMETHASONE SODIUM PHOSPHATE 10 MG/ML IJ SOLN
INTRAMUSCULAR | Status: DC | PRN
  Administered 2018-03-12: 5 mg via INTRAVENOUS

## 2018-03-12 MED ORDER — PROPOFOL 10 MG/ML IV BOLUS
INTRAVENOUS | Status: DC | PRN
  Administered 2018-03-12: 200 mg via INTRAVENOUS

## 2018-03-12 MED ORDER — LIDOCAINE HCL (PF) 2 % IJ SOLN
INTRAMUSCULAR | Status: AC
  Filled 2018-03-12: qty 10

## 2018-03-12 MED ORDER — HYDROCODONE-ACETAMINOPHEN 5-325 MG PO TABS
ORAL_TABLET | ORAL | Status: AC
  Filled 2018-03-12: qty 1

## 2018-03-12 MED ORDER — MIDAZOLAM HCL 2 MG/2ML IJ SOLN
INTRAMUSCULAR | Status: AC
  Filled 2018-03-12: qty 4

## 2018-03-12 MED ORDER — ONDANSETRON HCL 4 MG/2ML IJ SOLN
INTRAMUSCULAR | Status: AC
  Filled 2018-03-12: qty 2

## 2018-03-12 MED ORDER — DEXAMETHASONE SODIUM PHOSPHATE 10 MG/ML IJ SOLN
INTRAMUSCULAR | Status: AC
  Filled 2018-03-12: qty 1

## 2018-03-12 MED ORDER — FENTANYL CITRATE (PF) 100 MCG/2ML IJ SOLN
INTRAMUSCULAR | Status: AC
Start: 2018-03-12 — End: 2018-03-12
  Administered 2018-03-12: 25 ug via INTRAVENOUS
  Filled 2018-03-12: qty 2

## 2018-03-12 MED ORDER — BUPIVACAINE HCL 0.5 % IJ SOLN
INTRAMUSCULAR | Status: DC | PRN
  Administered 2018-03-12: 10 mL

## 2018-03-12 MED ORDER — LIDOCAINE HCL (CARDIAC) 20 MG/ML IV SOLN
INTRAVENOUS | Status: DC | PRN
  Administered 2018-03-12: 50 mg via INTRAVENOUS

## 2018-03-12 MED ORDER — BUPIVACAINE HCL (PF) 0.5 % IJ SOLN
INTRAMUSCULAR | Status: AC
  Filled 2018-03-12: qty 30

## 2018-03-12 MED ORDER — FENTANYL CITRATE (PF) 100 MCG/2ML IJ SOLN
INTRAMUSCULAR | Status: DC | PRN
  Administered 2018-03-12: 50 ug via INTRAVENOUS
  Administered 2018-03-12: 100 ug via INTRAVENOUS
  Administered 2018-03-12: 50 ug via INTRAVENOUS

## 2018-03-12 MED ORDER — FENTANYL CITRATE (PF) 100 MCG/2ML IJ SOLN
INTRAMUSCULAR | Status: AC
  Filled 2018-03-12: qty 4

## 2018-03-12 MED ORDER — FAMOTIDINE 20 MG PO TABS
20.0000 mg | ORAL_TABLET | Freq: Once | ORAL | Status: AC
  Administered 2018-03-12: 20 mg via ORAL

## 2018-03-12 MED ORDER — ONDANSETRON HCL 4 MG/2ML IJ SOLN
INTRAMUSCULAR | Status: DC | PRN
  Administered 2018-03-12: 4 mg via INTRAVENOUS

## 2018-03-12 MED ORDER — FAMOTIDINE 20 MG PO TABS
ORAL_TABLET | ORAL | Status: AC
  Administered 2018-03-12: 20 mg via ORAL
  Filled 2018-03-12: qty 1

## 2018-03-12 MED ORDER — FENTANYL CITRATE (PF) 100 MCG/2ML IJ SOLN
25.0000 ug | INTRAMUSCULAR | Status: DC | PRN
  Administered 2018-03-12 (×2): 25 ug via INTRAVENOUS

## 2018-03-12 MED ORDER — SODIUM CHLORIDE 0.9 % IV SOLN
INTRAVENOUS | Status: DC
  Administered 2018-03-12: 15:00:00 via INTRAVENOUS

## 2018-03-12 MED ORDER — GLYCOPYRROLATE 0.2 MG/ML IJ SOLN
INTRAMUSCULAR | Status: AC
  Filled 2018-03-12: qty 1

## 2018-03-12 MED ORDER — HYDROCODONE-ACETAMINOPHEN 5-325 MG PO TABS
1.0000 | ORAL_TABLET | ORAL | Status: DC | PRN
  Administered 2018-03-12: 1 via ORAL

## 2018-03-12 MED ORDER — FENTANYL CITRATE (PF) 100 MCG/2ML IJ SOLN
INTRAMUSCULAR | Status: AC
  Filled 2018-03-12: qty 2

## 2018-03-12 MED ORDER — GLYCOPYRROLATE 0.2 MG/ML IJ SOLN
INTRAMUSCULAR | Status: DC | PRN
  Administered 2018-03-12: 0.1 mg via INTRAVENOUS

## 2018-03-12 SURGICAL SUPPLY — 23 items
BANDAGE ACE 3X5.8 VEL STRL LF (GAUZE/BANDAGES/DRESSINGS) ×3 IMPLANT
CANISTER SUCT 1200ML W/VALVE (MISCELLANEOUS) ×3 IMPLANT
CHLORAPREP W/TINT 26ML (MISCELLANEOUS) ×3 IMPLANT
CUFF TOURN 18 STER (MISCELLANEOUS) IMPLANT
ELECT CAUTERY NDL 2.0 MIC (NEEDLE) IMPLANT
ELECT CAUTERY NEEDLE 2.0 MIC (NEEDLE) IMPLANT
GAUZE PETRO XEROFOAM 1X8 (MISCELLANEOUS) ×3 IMPLANT
GAUZE SPONGE 4X4 12PLY STRL (GAUZE/BANDAGES/DRESSINGS) ×3 IMPLANT
GLOVE SURG SYN 9.0  PF PI (GLOVE) ×2
GLOVE SURG SYN 9.0 PF PI (GLOVE) ×1 IMPLANT
GOWN SRG 2XL LVL 4 RGLN SLV (GOWNS) ×1 IMPLANT
GOWN STRL NON-REIN 2XL LVL4 (GOWNS) ×3
GOWN STRL REUS W/ TWL LRG LVL3 (GOWN DISPOSABLE) ×1 IMPLANT
GOWN STRL REUS W/TWL LRG LVL3 (GOWN DISPOSABLE) ×3
KIT TURNOVER KIT A (KITS) ×3 IMPLANT
NS IRRIG 500ML POUR BTL (IV SOLUTION) ×3 IMPLANT
PACK EXTREMITY ARMC (MISCELLANEOUS) ×3 IMPLANT
PAD CAST CTTN 4X4 STRL (SOFTGOODS) ×1 IMPLANT
PADDING CAST COTTON 4X4 STRL (SOFTGOODS) ×3
SCALPEL PROTECTED #15 DISP (BLADE) ×6 IMPLANT
SUT ETHILON 4-0 (SUTURE) ×3
SUT ETHILON 4-0 FS2 18XMFL BLK (SUTURE) ×1
SUTURE ETHLN 4-0 FS2 18XMF BLK (SUTURE) ×1 IMPLANT

## 2018-03-12 NOTE — Anesthesia Post-op Follow-up Note (Signed)
Anesthesia QCDR form completed.        

## 2018-03-12 NOTE — Op Note (Signed)
03/12/2018  5:09 PM  PATIENT:  Louis Salazar  62 y.o. male  PRE-OPERATIVE DIAGNOSIS:  carpal tunnel syndrome of right wrist  POST-OPERATIVE DIAGNOSIS:  carpal tunnel syndrome of right wrist  PROCEDURE:  Procedure(s): CARPAL TUNNEL RELEASE (Right)  SURGEON: Laurene Footman, MD  ASSISTANTS: None  ANESTHESIA:   general  EBL:  Total I/O In: 400 [I.V.:400] Out: 5 [Blood:5]  BLOOD ADMINISTERED:none  DRAINS: none   LOCAL MEDICATIONS USED:  MARCAINE     SPECIMEN:  No Specimen  DISPOSITION OF SPECIMEN:  N/A  COUNTS:  YES  TOURNIQUET:  * Missing tourniquet times found for documented tourniquets in log: [id number] *10 minutes at 250 mmHg IMPLANTS: None  DICTATION: .Dragon Dictation patient was brought to the operating room and after adequate anesthesia was obtained the right arm was prepped and draped in sterile fashion.  After patient identification and timeout procedures were completed the tourniquet was raised to a 50 mmHg the tourniquet being just below the elbow.  An incision was made approximately 2 cm in length in line with the ring metacarpal with the skin and subcutaneous tissue spread.  The transcarpal ligament is identified and there was some aberrant muscle overlying this which was elevated off the transcarpal ligament the ligament was opened and a vascular hemostat was placed deep to protect the underlying structures.  Release care carried out distally until fat was noted around the nerve and then proximally at the level of the wrist flexion crease there was an area of compression that was visible with blanching of the nerve after release more proximally there was good vascular blush to the nerve and no apparent compression the wound was irrigated and closed after infiltration of 10 cc of half percent Sensorcaine simple interrupted 5-0 nylon for the skin followed by Xeroform 4 x 4 web roll and Ace wrap  PLAN OF CARE: Discharge to home after PACU  PATIENT DISPOSITION:  PACU -  hemodynamically stable.

## 2018-03-12 NOTE — Discharge Instructions (Addendum)
AMBULATORY SURGERY  DISCHARGE INSTRUCTIONS   1) The drugs that you were given will stay in your system until tomorrow so for the next 24 hours you should not:  A) Drive an automobile B) Make any legal decisions C) Drink any alcoholic beverage   2) You may resume regular meals tomorrow.  Today it is better to start with liquids and gradually work up to solid foods.  You may eat anything you prefer, but it is better to start with liquids, then soup and crackers, and gradually work up to solid foods.   3) Please notify your doctor immediately if you have any unusual bleeding, trouble breathing, redness and pain at the surgery site, drainage, fever, or pain not relieved by medication. 4)   5) Your post-operative visit with Dr.                                     is: Date:                        Time:    Please call to schedule your post-operative visit.  6) Additional Instructions:      Loosen Ace wrap prior to discharge and if fingers swell.  Work on finger range of motion is much as possible.  Keep dressing clean and dry.  Pain medicine as directed

## 2018-03-12 NOTE — Anesthesia Procedure Notes (Signed)
Procedure Name: LMA Insertion Date/Time: 03/12/2018 4:34 PM Performed by: Nile Riggs, CRNA Pre-anesthesia Checklist: Patient identified, Emergency Drugs available, Suction available, Patient being monitored and Timeout performed Patient Re-evaluated:Patient Re-evaluated prior to induction Oxygen Delivery Method: Circle system utilized Preoxygenation: Pre-oxygenation with 100% oxygen Induction Type: IV induction Ventilation: Mask ventilation without difficulty LMA: LMA inserted LMA Size: 4.5 Number of attempts: 1 Placement Confirmation: positive ETCO2,  CO2 detector and breath sounds checked- equal and bilateral Tube secured with: Tape Dental Injury: Teeth and Oropharynx as per pre-operative assessment

## 2018-03-12 NOTE — Anesthesia Postprocedure Evaluation (Signed)
Anesthesia Post Note  Patient: Louis Salazar  Procedure(s) Performed: CARPAL TUNNEL RELEASE (Right )  Patient location during evaluation: PACU Anesthesia Type: General Level of consciousness: awake and alert and oriented Pain management: pain level controlled Vital Signs Assessment: post-procedure vital signs reviewed and stable Respiratory status: spontaneous breathing Cardiovascular status: blood pressure returned to baseline Anesthetic complications: no     Last Vitals:  Vitals:   03/12/18 1735 03/12/18 1745  BP: 120/76 120/81  Pulse: 87 79  Resp: 17 16  Temp: 37.2 C 36.9 C  SpO2: 94% 95%    Last Pain:  Vitals:   03/12/18 1745  TempSrc: Temporal  PainSc: 4                  Spyridon Hornstein

## 2018-03-12 NOTE — Transfer of Care (Signed)
Immediate Anesthesia Transfer of Care Note  Patient: Louis Salazar  Procedure(s) Performed: CARPAL TUNNEL RELEASE (Right )  Patient Location: PACU  Anesthesia Type:General  Level of Consciousness: awake, alert , oriented and patient cooperative  Airway & Oxygen Therapy: Patient Spontanous Breathing and Patient connected to face mask oxygen  Post-op Assessment: Report given to RN, Post -op Vital signs reviewed and stable and Patient moving all extremities  Post vital signs: Reviewed and stable  Last Vitals:  Vitals Value Taken Time  BP 122/83 03/12/2018  5:07 PM  Temp 37.2 C 03/12/2018  5:05 PM  Pulse 97 03/12/2018  5:07 PM  Resp 12 03/12/2018  5:07 PM  SpO2 98 % 03/12/2018  5:07 PM  Vitals shown include unvalidated device data.  Last Pain:  Vitals:   03/12/18 1705  TempSrc:   PainSc: 0-No pain         Complications: No apparent anesthesia complications

## 2018-03-12 NOTE — H&P (Signed)
Reviewed paper H+P, will be scanned into chart. No changes noted.  

## 2018-03-12 NOTE — Anesthesia Preprocedure Evaluation (Signed)
Anesthesia Evaluation  Patient identified by MRN, date of birth, ID band Patient awake    Reviewed: Allergy & Precautions, NPO status , Patient's Chart, lab work & pertinent test results  History of Anesthesia Complications Negative for: history of anesthetic complications  Airway Mallampati: II       Dental  (+) Dental Advidsory Given   Pulmonary neg sleep apnea, neg COPD, former smoker,           Cardiovascular hypertension, Pt. on medications and Pt. on home beta blockers (-) Past MI and (-) CHF (-) dysrhythmias (-) Valvular Problems/Murmurs     Neuro/Psych neg Seizures Depression    GI/Hepatic Neg liver ROS, GERD (s/p Nisin)  ,  Endo/Other  neg diabetes  Renal/GU negative Renal ROS     Musculoskeletal   Abdominal   Peds  Hematology   Anesthesia Other Findings Past Medical History: No date: Arthritis     Comment:  right shoulder No date: Enlarged prostate No date: Foreign body, eye     Comment:  left eyelid No date: GERD (gastroesophageal reflux disease)     Comment:  nissan plication and this has improved No date: Hemorrhoid No date: Hypertension     Comment:  controlled on med No date: Neck pain     Comment:  "EXPERIENCE CRICK IN NECK". comes and goes 2019 No date: Neuromuscular disorder ([place])     Comment:  hands are tingly and go numb. possibly carpal tunnel.                right hand No date: Pre-diabetes     Comment:  A1C in 2019 was 6.0 No date: Substance abuse ([place])     Comment:  hx of inhalants, last use 2010.  regular  daily alcohol               intake (2019)   Reproductive/Obstetrics                             Anesthesia Physical  Anesthesia Plan  ASA: III  Anesthesia Plan: General   Post-op Pain Management:    Induction: Intravenous  PONV Risk Score and Plan: 3 and Dexamethasone and Ondansetron  Airway Management Planned: LMA  Additional Equipment:    Intra-op Plan:   Post-operative Plan: Extubation in OR  Informed Consent: I have reviewed the patients History and Physical, chart, labs and discussed the procedure including the risks, benefits and alternatives for the proposed anesthesia with the patient or authorized representative who has indicated his/her understanding and acceptance.     Plan Discussed with: Anesthesiologist, CRNA and Surgeon  Anesthesia Plan Comments:         Anesthesia Quick Evaluation

## 2018-03-13 ENCOUNTER — Encounter: Payer: Self-pay | Admitting: Orthopedic Surgery

## 2018-03-23 ENCOUNTER — Emergency Department
Admission: EM | Admit: 2018-03-23 | Discharge: 2018-03-23 | Disposition: A | Discharge status: Home / Self Care | Payer: BLUE CROSS/BLUE SHIELD | Attending: Emergency Medicine | Admitting: Emergency Medicine

## 2018-03-23 ENCOUNTER — Other Ambulatory Visit: Payer: Self-pay

## 2018-03-23 ENCOUNTER — Encounter: Payer: Self-pay | Admitting: Emergency Medicine

## 2018-03-23 ENCOUNTER — Emergency Department: Payer: BLUE CROSS/BLUE SHIELD

## 2018-03-23 DIAGNOSIS — I10 Essential (primary) hypertension: Secondary | ICD-10-CM | POA: Insufficient documentation

## 2018-03-23 DIAGNOSIS — Z79899 Other long term (current) drug therapy: Secondary | ICD-10-CM | POA: Insufficient documentation

## 2018-03-23 DIAGNOSIS — I82491 Acute embolism and thrombosis of other specified deep vein of right lower extremity: Secondary | ICD-10-CM | POA: Insufficient documentation

## 2018-03-23 DIAGNOSIS — M79661 Pain in right lower leg: Secondary | ICD-10-CM | POA: Diagnosis present

## 2018-03-23 DIAGNOSIS — Z87891 Personal history of nicotine dependence: Secondary | ICD-10-CM | POA: Insufficient documentation

## 2018-03-23 DIAGNOSIS — M79669 Pain in unspecified lower leg: Secondary | ICD-10-CM

## 2018-03-23 LAB — CBC WITH DIFFERENTIAL/PLATELET
BASOS PCT: 1 %
Basophils Absolute: 0.1 10*3/uL (ref 0–0.1)
EOS ABS: 0.1 10*3/uL (ref 0–0.7)
EOS PCT: 2 %
HEMATOCRIT: 45.1 % (ref 40.0–52.0)
Hemoglobin: 15.1 g/dL (ref 13.0–18.0)
Lymphocytes Relative: 21 %
Lymphs Abs: 1.6 10*3/uL (ref 1.0–3.6)
MCH: 28.4 pg (ref 26.0–34.0)
MCHC: 33.6 g/dL (ref 32.0–36.0)
MCV: 84.7 fL (ref 80.0–100.0)
MONO ABS: 0.7 10*3/uL (ref 0.2–1.0)
MONOS PCT: 9 %
NEUTROS ABS: 5.3 10*3/uL (ref 1.4–6.5)
Neutrophils Relative %: 67 %
PLATELETS: 212 10*3/uL (ref 150–440)
RBC: 5.32 MIL/uL (ref 4.40–5.90)
RDW: 14.7 % — AB (ref 11.5–14.5)
WBC: 7.7 10*3/uL (ref 3.8–10.6)

## 2018-03-23 LAB — COMPREHENSIVE METABOLIC PANEL
ALBUMIN: 3.8 g/dL (ref 3.5–5.0)
ALT: 9 U/L — ABNORMAL LOW (ref 17–63)
ANION GAP: 7 (ref 5–15)
AST: 18 U/L (ref 15–41)
Alkaline Phosphatase: 57 U/L (ref 38–126)
BILIRUBIN TOTAL: 0.7 mg/dL (ref 0.3–1.2)
BUN: 24 mg/dL — ABNORMAL HIGH (ref 6–20)
CHLORIDE: 105 mmol/L (ref 101–111)
CO2: 26 mmol/L (ref 22–32)
Calcium: 8.6 mg/dL — ABNORMAL LOW (ref 8.9–10.3)
Creatinine, Ser: 1.12 mg/dL (ref 0.61–1.24)
GFR calc Af Amer: >60 mL/min (ref >60)
GLUCOSE: 105 mg/dL — AB (ref 65–99)
POTASSIUM: 4.6 mmol/L (ref 3.5–5.1)
Sodium: 138 mmol/L (ref 135–145)
TOTAL PROTEIN: 7.7 g/dL (ref 6.5–8.1)

## 2018-03-23 LAB — PROTIME-INR
INR: 0.95
PROTHROMBIN TIME: 12.6 s (ref 11.4–15.2)

## 2018-03-23 LAB — APTT: APTT: 27 s (ref 24–36)

## 2018-03-23 MED ORDER — ELIQUIS 5 MG VTE STARTER PACK: ORAL_TABLET | ORAL | 0 refills | Status: DC

## 2018-03-23 MED ORDER — APIXABAN 5 MG PO TABS
10.0000 mg | ORAL_TABLET | Freq: Once | ORAL | Status: AC
  Administered 2018-03-23: 10 mg via ORAL
  Filled 2018-03-23: qty 2

## 2018-03-23 NOTE — ED Provider Notes (Signed)
[facility] Emergency Department Provider Note  ___________________________________________   First MD Initiated Contact with Patient 03/23/18 1104     (approximate)  I have reviewed the triage vital signs and the nursing notes.   HISTORY  Chief Complaint Leg Pain  HPI Louis MACAULEY is a 62 y.o. male with a recent history of right wrist carpal tunnel surgery on April 16 as well as right knee arthrosis on March 25 who is presenting to the emergency department with worsening pain as well as redness to his right calf.  He denies any chest pain or shortness of breath.  Says that he did have a history of a GI bleed 6 months ago.  Has never been on anticoagulation.  Does not report any fever.   Past Medical History:  Diagnosis Date  . Arthritis    right shoulder  . Enlarged prostate   . Foreign body, eye    left eyelid  . GERD (gastroesophageal reflux disease)    nissan plication and this has improved  . Hemorrhoid   . Hypertension    controlled on med  . Neck pain    "EXPERIENCE CRICK IN NECK". comes and goes 2019  . Neuromuscular disorder ([place])    hands are tingly and go numb. possibly carpal tunnel.  right hand  . Pre-diabetes    A1C in 2019 was 6.0  . Substance abuse ([place])    hx of inhalants, last use 2010.  regular  daily alcohol intake (2019)    Patient Active Problem List   Diagnosis Date Noted  . Umbilical hernia without obstruction and without gangrene 08/24/2016  . Obesity 02/28/2016  . Depression 02/28/2016  . Parotiditis 02/14/2016  . Left leg swelling 02/14/2016  . Excessive drinking alcohol 02/14/2016  . Tachycardia 02/14/2016  . Blood in stool   . Other specified diseases of esophagus   . Gastritis   . Benign neoplasm of cecum   . Cellulitis and abscess of oral soft tissues   . Benign neoplasm of sigmoid colon   . 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 Essential (primary) hypertension 05/24/2015  . Brash 05/24/2015  .  History of colon polyps 05/24/2015  . Arthralgia of shoulder 05/24/2015  . Insomnia 05/24/2015  . Primary osteoarthritis of both shoulders 04/27/2015  . Carpal tunnel syndrome 04/27/2015    Past Surgical History:  Procedure Laterality Date  . ABDOMINAL SURGERY  1610   nissen fundoplication  . BUNIONECTOMY Left 2007  . CARPAL TUNNEL RELEASE Right 03/12/2018   Procedure: CARPAL TUNNEL RELEASE;  Surgeon: Hessie Knows, MD;  Location: [facility];  Service: Orthopedics;  Laterality: Right;  . COLONOSCOPY WITH PROPOFOL N/A 10/18/2015   Procedure: COLONOSCOPY WITH PROPOFOL;  Surgeon: Lucilla Lame, MD;  Location: [place];  Service: Endoscopy;  Laterality: N/A;  . ESOPHAGOGASTRODUODENOSCOPY (EGD) WITH PROPOFOL N/A 10/18/2015   Procedure: ESOPHAGOGASTRODUODENOSCOPY (EGD) WITH PROPOFOL;  Surgeon: Lucilla Lame, MD;  Location: [place];  Service: Endoscopy;  Laterality: N/A;  wants as early as possible  . HERNIA REPAIR    . KNEE ARTHROSCOPY Right 02/18/2018   Procedure: ARTHROSCOPY KNEE;  Surgeon: Dereck Leep, MD;  Location: [facility];  Service: Orthopedics;  Laterality: Right;  . KNEE ARTHROSCOPY WITH LATERAL MENISECTOMY Right 02/18/2018   Procedure: KNEE ARTHROSCOPY WITH LATERAL MENISECTOMY;  Surgeon: Dereck Leep, MD;  Location: [facility];  Service: Orthopedics;  Laterality: Right;  . KNEE ARTHROSCOPY WITH MEDIAL MENISECTOMY Right 02/18/2018   Procedure: KNEE ARTHROSCOPY  WITH MEDIAL MENISECTOMY;  Surgeon: Dereck Leep, MD;  Location: [facility];  Service: Orthopedics;  Laterality: Right;  . NISSEN FUNDOPLICATION  0623  . SHOULDER SURGERY  04/2016   Right shoulder replacement  . UMBILICAL HERNIA REPAIR N/A 09/12/2016   Procedure: HERNIA REPAIR UMBILICAL ADULT;  Surgeon: Robert Bellow, MD;  Location: [facility];  Service: General;  Laterality: N/A;  . WRIST SURGERY Left 1990   bones fused. metal in wrist    Prior to Admission medications   Medication Sig Start Date End  Date Taking? Authorizing Provider  acetaminophen (TYLENOL) 500 MG tablet Take 1,000 mg by mouth every 6 (six) hours as needed for moderate pain or headache.     [provider]  clomiPHENE (CLOMID) 50 MG tablet Take by mouth every other day. Every Monday Wednesday friday    [provider]  finasteride (PROSCAR) 5 MG tablet Take 5 mg by mouth at bedtime.     [provider]  ibuprofen (ADVIL,MOTRIN) 200 MG tablet Take 400 mg by mouth daily as needed for headache or moderate pain.    [provider]  loperamide (IMODIUM) 2 MG capsule Take 2 mg by mouth daily.    [provider]  Melatonin 10 MG TABS Take 10 mg by mouth at bedtime as needed (sleep).    [provider]  Menthol, Topical Analgesic, (BENGAY EX) Apply 1 application topically daily as needed (knee pain).    [provider]  metoprolol succinate (TOPROL-XL) 25 MG 24 hr tablet Take 1 tablet (25 mg total) by mouth 2 (two) times daily. 03/15/16   Arlis Porta., MD  Multiple Vitamins-Minerals (CENTRUM SILVER PO) Take 1 tablet by mouth daily.     [provider]  tamsulosin (FLOMAX) 0.4 MG CAPS capsule Take 0.4 mg by mouth at bedtime.     [provider]  temazepam (RESTORIL) 30 MG capsule Take 30 mg by mouth at bedtime.     [provider]  traMADol (ULTRAM) 50 MG tablet Take 50 mg by mouth every 6 (six) hours as needed for moderate pain.     [provider]    Allergies Celebrex [celecoxib] and Sulfa antibiotics  Family History  Problem Relation Age of Onset  . Cancer Other   . COPD Mother   . Arthritis Maternal Grandmother   . Cancer Maternal Grandmother   . Diabetes Maternal Grandmother     Social History Social History   Tobacco Use  . Smoking status: Former Smoker    Packs/day: 1.00    Years: 30.00    Pack years: 30.00    Types: Cigarettes    Last attempt to quit: 11/28/1995    Years since quitting: 22.3  . Smokeless  tobacco: Current User    Types: Chew  . Tobacco comment: 1 can of chew in about 3 days.last chew was 9:30 am day of surgery  Substance Use Topics  . Alcohol use: Yes    Alcohol/week: 1.8 oz    Types: 3 Cans of beer per week    Comment: 3 BEERS QD or shots of liquor  . Drug use: No    Comment: PT WITH H/O INHALENTS-LAST USED 2010    Review of Systems  Constitutional: No fever/chills Eyes: No visual changes. ENT: No sore throat. Cardiovascular: Denies chest pain. Respiratory: Denies shortness of breath. Gastrointestinal: No abdominal pain.  No nausea, no vomiting.  No diarrhea.  No constipation. Genitourinary: Negative for dysuria. Musculoskeletal: Negative for  back pain. Skin: As above Neurological: Negative for headaches, focal weakness or numbness.   ____________________________________________   PHYSICAL EXAM:  VITAL SIGNS: ED Triage Vitals  Enc Vitals Group     BP 03/23/18 0946 (!) 123/93     Pulse Rate 03/23/18 0946 (!) 104     Resp 03/23/18 0946 16     Temp 03/23/18 0946 97.7 F (36.5 C)     Temp Source 03/23/18 0946 Oral     SpO2 03/23/18 0946 95 %     Weight 03/23/18 0942 265 lb (120.2 kg)     Height 03/23/18 0942 5\' 11"  (1.803 m)     Head Circumference --      Peak Flow --      Pain Score 03/23/18 0941 4     Pain Loc --      Pain Edu? --      Excl. in [place]? --     Constitutional: Alert and oriented. Well appearing and in no acute distress. Eyes: Conjunctivae are normal.  Head: Atraumatic. Nose: No congestion/rhinnorhea. Mouth/Throat: Mucous membranes are moist.  Neck: No stridor.   Cardiovascular: Normal rate, regular rhythm. Grossly normal heart sounds.  Respiratory: Normal respiratory effort.  No retractions. Lungs CTAB. Gastrointestinal: Soft and nontender. No distention. Musculoskeletal: Right lower extremity with swelling to the posterior calf as well as redness.  Also, the veins appear engorged to the right lower extremity.  Mild warmth to the  posterior calf as well. Neurologic:  Normal speech and language. No gross focal neurologic deficits are appreciated. Skin:  Skin is warm, dry and intact. No rash noted. Psychiatric: Mood and affect are normal. Speech and behavior are normal.  ____________________________________________   LABS (all labs ordered are listed, but only abnormal results are displayed)  Labs Reviewed  CBC WITH DIFFERENTIAL/PLATELET - Abnormal; Notable for the following components:      Result Value   RDW 14.7 (*)    All other components within normal limits  COMPREHENSIVE METABOLIC PANEL - Abnormal; Notable for the following components:   Glucose, Bld 105 (*)    BUN 24 (*)    Calcium 8.6 (*)    ALT 9 (*)    All other components within normal limits  PROTIME-INR  APTT   ____________________________________________  EKG   ____________________________________________  RADIOLOGY  Positive for acute occlusive DVT in the popliteal and posterior tibial veins.  Positive for acute occlusive superficial thrombosis of the small saphenous vein. ____________________________________________   PROCEDURES  Procedure(s) performed:   Procedures  Critical Care performed:   ____________________________________________   INITIAL IMPRESSION / ASSESSMENT AND PLAN / ED COURSE  Pertinent labs & imaging results that were available during my care of the patient were reviewed by me and considered in my medical decision making (see chart for details).  DDX: Cellulitis, DVT, thrombophlebitis, compartment syndrome, arterial compromise As part of my medical decision making, I reviewed the following data within the [place] surgical records.  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  I reviewed the patient's ER chart when he had his GI bleed.  He noted GI bleeding at home.   However, in the emergency department no blood was noted on rectal exam and he was stable hemoglobin.  Unclear if this was in fact GI bleeding at the time or some other mimic.  Today in the emergency department he has reassuring coagulation studies as well as normal platelets.  I believe this time  that the benefits of anticoagulation outweigh the risks.  We discussed precautions including stopping his Eliquis immediately for any GI bleeding and reporting to the emergency department for any bleeding concerns including head trauma.  Patient will follow-up with vascular surgery.  He is understanding of the diagnosis as well as treatment plans willing to comply. ____________________________________________   FINAL CLINICAL IMPRESSION(S) / ED DIAGNOSES  Final diagnoses:  Calf pain  DVT.    NEW MEDICATIONS STARTED DURING THIS VISIT:  New Prescriptions   No medications on file     Note:  This document was prepared using Dragon voice recognition software and may include unintentional dictation errors.     Orbie Pyo, MD 03/23/18 1256

## 2018-03-23 NOTE — ED Triage Notes (Signed)
Pt states pain in right calf only when pressing on it.

## 2018-03-23 NOTE — ED Triage Notes (Signed)
Pt sent here from [facility], states he had right knee surgery 3 weeks ago, now right calf pain, warm to touch and painful.

## 2018-04-08 ENCOUNTER — Encounter (INDEPENDENT_AMBULATORY_CARE_PROVIDER_SITE_OTHER): Payer: Self-pay | Admitting: Vascular Surgery

## 2018-04-08 ENCOUNTER — Ambulatory Visit (INDEPENDENT_AMBULATORY_CARE_PROVIDER_SITE_OTHER): Payer: BLUE CROSS/BLUE SHIELD | Admitting: Vascular Surgery

## 2018-04-08 VITALS — BP 152/86 | HR 108 | Resp 18 | Ht 71.0 in | Wt 269.0 lb

## 2018-04-08 DIAGNOSIS — I10 Essential (primary) hypertension: Secondary | ICD-10-CM

## 2018-04-08 DIAGNOSIS — I82431 Acute embolism and thrombosis of right popliteal vein: Secondary | ICD-10-CM | POA: Diagnosis not present

## 2018-04-09 ENCOUNTER — Encounter (INDEPENDENT_AMBULATORY_CARE_PROVIDER_SITE_OTHER): Payer: Self-pay | Admitting: Vascular Surgery

## 2018-04-09 DIAGNOSIS — I82409 Acute embolism and thrombosis of unspecified deep veins of unspecified lower extremity: Secondary | ICD-10-CM | POA: Insufficient documentation

## 2018-04-09 NOTE — Progress Notes (Signed)
MRN : [id number]  AMAURIE Salazar is a 62 y.o. ([birth year]) male who presents with chief complaint of  Chief Complaint  Patient presents with  . Follow-up    [facility] f/u, blood clot  .  History of Present Illness:  The patient presents to the office for evaluation of DVT.  DVT was identified at [facility] by Duplex ultrasound.  The initial symptoms were pain and swelling in the lower extremity.  The patient notes the leg continues to be very painful with dependency and swells quite a bite.  Symptoms are much better with elevation.  The patient notes minimal edema in the morning which steadily worsens throughout the day.    The patient has not been using compression therapy at this point.  No SOB or pleuritic chest pains.  No cough or hemoptysis.  No blood per rectum or blood in any sputum.  No excessive bruising per the patient.   Current Meds  Medication Sig  . acetaminophen (TYLENOL) 500 MG tablet Take 1,000 mg by mouth every 6 (six) hours as needed for moderate pain or headache.   . clomiPHENE (CLOMID) 50 MG tablet Take by mouth every other day. Every Monday Wednesday friday  . ELIQUIS STARTER PACK (ELIQUIS STARTER PACK) 5 MG TABS Take as directed on package: start with two-5mg  tablets twice daily for 7 days. On day 8, switch to one-5mg  tablet twice daily.  . finasteride (PROSCAR) 5 MG tablet Take 5 mg by mouth at bedtime.   . fluorometholone (FML) 0.1 % ophthalmic suspension   . ibuprofen (ADVIL,MOTRIN) 200 MG tablet Take 400 mg by mouth daily as needed for headache or moderate pain.  Marland Kitchen loperamide (IMODIUM) 2 MG capsule Take 2 mg by mouth daily.  . Melatonin 10 MG TABS Take 10 mg by mouth at bedtime as needed (sleep).  . Menthol, Topical Analgesic, (BENGAY EX) Apply 1 application topically daily as needed (knee pain).  . metoprolol succinate (TOPROL-XL) 25 MG 24 hr tablet Take 1 tablet (25 mg total) by mouth 2 (two) times daily.  . Multiple Vitamins-Minerals (CENTRUM SILVER PO) Take  1 tablet by mouth daily.   . tamsulosin (FLOMAX) 0.4 MG CAPS capsule Take 0.4 mg by mouth at bedtime.   . temazepam (RESTORIL) 30 MG capsule Take 30 mg by mouth at bedtime.   . traMADol (ULTRAM) 50 MG tablet Take 50 mg by mouth every 6 (six) hours as needed for moderate pain.     Past Medical History:  Diagnosis Date  . Arthritis    right shoulder  . Enlarged prostate   . Foreign body, eye    left eyelid  . GERD (gastroesophageal reflux disease)    nissan plication and this has improved  . Hemorrhoid   . Hypertension    controlled on med  . Neck pain    "EXPERIENCE CRICK IN NECK". comes and goes 2019  . Neuromuscular disorder ([place])    hands are tingly and go numb. possibly carpal tunnel.  right hand  . Pre-diabetes    A1C in 2019 was 6.0  . Substance abuse ([place])    hx of inhalants, last use 2010.  regular  daily alcohol intake (2019)    Past Surgical History:  Procedure Laterality Date  . ABDOMINAL SURGERY  5916   nissen fundoplication  . BUNIONECTOMY Left 2007  . CARPAL TUNNEL RELEASE Right 03/12/2018   Procedure: CARPAL TUNNEL RELEASE;  Surgeon: Hessie Knows, MD;  Location: [facility];  Service: Orthopedics;  Laterality:  Right;  Marland Kitchen COLONOSCOPY WITH PROPOFOL N/A 10/18/2015   Procedure: COLONOSCOPY WITH PROPOFOL;  Surgeon: Lucilla Lame, MD;  Location: [place];  Service: Endoscopy;  Laterality: N/A;  . ESOPHAGOGASTRODUODENOSCOPY (EGD) WITH PROPOFOL N/A 10/18/2015   Procedure: ESOPHAGOGASTRODUODENOSCOPY (EGD) WITH PROPOFOL;  Surgeon: Lucilla Lame, MD;  Location: [place];  Service: Endoscopy;  Laterality: N/A;  wants as early as possible  . HERNIA REPAIR    . KNEE ARTHROSCOPY Right 02/18/2018   Procedure: ARTHROSCOPY KNEE;  Surgeon: Dereck Leep, MD;  Location: [facility];  Service: Orthopedics;  Laterality: Right;  . KNEE ARTHROSCOPY WITH LATERAL MENISECTOMY Right 02/18/2018   Procedure: KNEE ARTHROSCOPY WITH LATERAL MENISECTOMY;  Surgeon: Dereck Leep,  MD;  Location: [facility];  Service: Orthopedics;  Laterality: Right;  . KNEE ARTHROSCOPY WITH MEDIAL MENISECTOMY Right 02/18/2018   Procedure: KNEE ARTHROSCOPY WITH MEDIAL MENISECTOMY;  Surgeon: Dereck Leep, MD;  Location: [facility];  Service: Orthopedics;  Laterality: Right;  . NISSEN FUNDOPLICATION  6629  . SHOULDER SURGERY  04/2016   Right shoulder replacement  . UMBILICAL HERNIA REPAIR N/A 09/12/2016   Procedure: HERNIA REPAIR UMBILICAL ADULT;  Surgeon: Robert Bellow, MD;  Location: [facility];  Service: General;  Laterality: N/A;  . WRIST SURGERY Left 1990   bones fused. metal in wrist    Social History Social History   Tobacco Use  . Smoking status: Former Smoker    Packs/day: 1.00    Years: 30.00    Pack years: 30.00    Types: Cigarettes    Last attempt to quit: 11/28/1995    Years since quitting: 22.3  . Smokeless tobacco: Current User    Types: Chew  . Tobacco comment: 1 can of chew in about 3 days.last chew was 9:30 am day of surgery  Substance Use Topics  . Alcohol use: Yes    Alcohol/week: 1.8 oz    Types: 3 Cans of beer per week    Comment: 3 BEERS QD or shots of liquor  . Drug use: No    Comment: PT WITH H/O INHALENTS-LAST USED 2010    Family History Family History  Problem Relation Age of Onset  . Cancer Other   . COPD Mother   . Arthritis Maternal Grandmother   . Cancer Maternal Grandmother   . Diabetes Maternal Grandmother     Allergies  Allergen Reactions  . Celebrex [Celecoxib] Hives and Rash  . Sulfa Antibiotics Other (See Comments)    Unknown - childhood allergy     REVIEW OF SYSTEMS (Negative unless checked)  Constitutional: [] Weight loss  [] Fever  [] Chills Cardiac: [] Chest pain   [] Chest pressure   [] Palpitations   [] Shortness of breath when laying flat   [] Shortness of breath with exertion. Vascular:  [] Pain in legs with walking   [x] Pain in legs at rest  [x] History of DVT   [] Phlebitis   [x] Swelling in legs   [] Varicose veins    [] Non-healing ulcers Pulmonary:   [] Uses home oxygen   [] Productive cough   [] Hemoptysis   [] Wheeze  [] COPD   [] Asthma Neurologic:  [] Dizziness   [] Seizures   [] History of stroke   [] History of TIA  [] Aphasia   [] Vissual changes   [] Weakness or numbness in arm   [] Weakness or numbness in leg Musculoskeletal:   [] Joint swelling   [] Joint pain   [] Low back pain Hematologic:  [] Easy bruising  [] Easy bleeding   [] Hypercoagulable state   [] Anemic Gastrointestinal:  [] Diarrhea   [] Vomiting  []   Gastroesophageal reflux/heartburn   [] Difficulty swallowing. Genitourinary:  [] Chronic kidney disease   [] Difficult urination  [] Frequent urination   [] Blood in urine Skin:  [] Rashes   [] Ulcers  Psychological:  [] History of anxiety   []  History of major depression.  Physical Examination  Vitals:   04/08/18 1620  BP: (!) 152/86  Pulse: (!) 108  Resp: 18  Weight: 269 lb (122 kg)  Height: 5\' 11"  (1.803 m)   Body mass index is 37.52 kg/m. Gen: WD/WN, NAD Head: /AT, No temporalis wasting.  Ear/Nose/Throat: Hearing grossly intact, nares w/o erythema or drainage Eyes: PER, EOMI, sclera nonicteric.  Neck: Supple, no large masses.   Pulmonary:  Good air movement, no audible wheezing bilaterally, no use of accessory muscles.  Cardiac: RRR, no JVD Vascular: scattered varicosities present bilaterally.  Mild venous stasis changes to the legs bilaterally.  2+ soft pitting edema Vessel Right Left  Radial Palpable Palpable  PT Palpable Palpable  DP Palpable Palpable  Gastrointestinal: Non-distended. No guarding/no peritoneal signs.  Musculoskeletal: M/S 5/5 throughout.  No deformity or atrophy.  Neurologic: CN 2-12 intact. Symmetrical.  Speech is fluent. Motor exam as listed above. Psychiatric: Judgment intact, Mood & affect appropriate for pt's clinical situation. Dermatologic: No rashes or ulcers noted.  No changes consistent with cellulitis. Lymph : No lichenification or skin changes of chronic  lymphedema.  CBC Lab Results  Component Value Date   WBC 7.7 03/23/2018   HGB 15.1 03/23/2018   HCT 45.1 03/23/2018   MCV 84.7 03/23/2018   PLT 212 03/23/2018    BMET    Component Value Date/Time   NA 138 03/23/2018 1143   NA 146 (H) 02/15/2016 0844   K 4.6 03/23/2018 1143   CL 105 03/23/2018 1143   CO2 26 03/23/2018 1143   GLUCOSE 105 (H) 03/23/2018 1143   BUN 24 (H) 03/23/2018 1143   BUN 26 (H) 02/15/2016 0844   CREATININE 1.12 03/23/2018 1143   CALCIUM 8.6 (L) 03/23/2018 1143   GFRNONAA >60 03/23/2018 1143   GFRAA >60 03/23/2018 1143   Estimated Creatinine Clearance: 92.1 mL/min (by C-G formula based on SCr of 1.12 mg/dL).  COAG Lab Results  Component Value Date   INR 0.95 03/23/2018    Radiology US Venous Img Lower Unilateral Right  Result Date: 03/23/2018 CLINICAL DATA:  62 year old male with right calf pain for the past 2 days EXAM: RIGHT LOWER EXTREMITY VENOUS DOPPLER ULTRASOUND TECHNIQUE: Gray-scale sonography with graded compression, as well as color Doppler and duplex ultrasound were performed to evaluate the lower extremity deep venous systems from the level of the common femoral vein and including the common femoral, femoral, profunda femoral, popliteal and calf veins including the posterior tibial, peroneal and gastrocnemius veins when visible. The superficial great saphenous vein was also interrogated. Spectral Doppler was utilized to evaluate flow at rest and with distal augmentation maneuvers in the common femoral, femoral and popliteal veins. COMPARISON:  None. FINDINGS: Contralateral Common Femoral Vein: Respiratory phasicity is normal and symmetric with the symptomatic side. No evidence of thrombus. Normal compressibility. Common Femoral Vein: No evidence of thrombus. Normal compressibility, respiratory phasicity and response to augmentation. Saphenofemoral Junction: No evidence of thrombus. Normal compressibility and flow on color Doppler imaging. Profunda  Femoral Vein: No evidence of thrombus. Normal compressibility and flow on color Doppler imaging. Femoral Vein: No evidence of thrombus. Normal compressibility, respiratory phasicity and response to augmentation. Popliteal Vein: Popliteal vein is incompletely compressible. The lumen is expanded and filled with low-level internal echoes.  Minimal flow on color Doppler imaging. Findings are concerning for occlusive acute PE. Calf Veins: Thrombus extends into the posterior tibial veins. The peroneal veins remain patent. Thrombus also extends into the small saphenous vein which remains occluded throughout its course. Superficial Great Saphenous Vein: No evidence of thrombus. Normal compressibility. Venous Reflux:  None. Other Findings:  Completely thrombosed small saphenous vein. IMPRESSION: 1. Positive for acute occlusive DVT in the popliteal and posterior tibial veins. 2. Positive for acute occlusive superficial thrombosis of the small saphenous vein. Electronically Signed   By: Jacqulynn Cadet M.D.   On: 03/23/2018 11:23      Assessment/Plan 1. Acute deep vein thrombosis (DVT) of popliteal vein of right lower extremity (HCC) Recommend:   No surgery or intervention at this point in time.  IVC filter is not indicated at present.  Patient's duplex ultrasound of the venous system shows DVT in  the popliteal vein.  The patient is initiated on anticoagulation   Elevation was stressed, use of a recliner was discussed.  I have had a long discussion with the patient regarding DVT and post phlebitic changes such as swelling and why it  causes symptoms such as pain.  The patient will wear graduated compression stockings class 1 (20-30 mmHg), beginning after three full days of anticoagulation, on a daily basis a prescription was given. The patient will  beginning wearing the stockings first thing in the morning and removing them in the evening. The patient is instructed specifically not to sleep in the  stockings.  In addition, behavioral modification including elevation during the day and avoidance of prolonged dependency will be initiated.    The patient will continue anticoagulation for now as there have not been any problems or complications at this point.    2. Essential (primary) hypertension Continue antihypertensive medications as already ordered, these medications have been reviewed and there are no changes at this time.     Hortencia Pilar, MD  04/09/2018 8:51 PM

## 2018-04-15 ENCOUNTER — Telehealth (INDEPENDENT_AMBULATORY_CARE_PROVIDER_SITE_OTHER): Payer: Self-pay | Admitting: Vascular Surgery

## 2018-04-15 ENCOUNTER — Other Ambulatory Visit (INDEPENDENT_AMBULATORY_CARE_PROVIDER_SITE_OTHER): Payer: Self-pay

## 2018-04-15 NOTE — Telephone Encounter (Signed)
Called the patient back to let him know that the refill on his medication has been called in to the pharmacy that's on file on his chart. Had to leave a voicemail.

## 2018-06-17 ENCOUNTER — Other Ambulatory Visit (HOSPITAL_COMMUNITY): Payer: Self-pay | Admitting: Student

## 2018-06-17 DIAGNOSIS — G8929 Other chronic pain: Secondary | ICD-10-CM

## 2018-06-17 DIAGNOSIS — M25561 Pain in right knee: Principal | ICD-10-CM

## 2018-06-17 DIAGNOSIS — M1711 Unilateral primary osteoarthritis, right knee: Secondary | ICD-10-CM

## 2018-06-17 DIAGNOSIS — Z9889 Other specified postprocedural states: Secondary | ICD-10-CM

## 2018-06-28 ENCOUNTER — Ambulatory Visit
Admission: RE | Admit: 2018-06-28 | Discharge: 2018-06-28 | Disposition: A | Discharge status: Home / Self Care | Payer: BLUE CROSS/BLUE SHIELD | Source: Ambulatory Visit | Attending: Student | Admitting: Student

## 2018-06-28 DIAGNOSIS — M1711 Unilateral primary osteoarthritis, right knee: Secondary | ICD-10-CM | POA: Diagnosis not present

## 2018-06-28 DIAGNOSIS — G8929 Other chronic pain: Secondary | ICD-10-CM | POA: Diagnosis present

## 2018-06-28 DIAGNOSIS — M25461 Effusion, right knee: Secondary | ICD-10-CM | POA: Diagnosis not present

## 2018-06-28 DIAGNOSIS — M659 Synovitis and tenosynovitis, unspecified: Secondary | ICD-10-CM | POA: Insufficient documentation

## 2018-06-28 DIAGNOSIS — M25561 Pain in right knee: Secondary | ICD-10-CM | POA: Insufficient documentation

## 2018-06-28 DIAGNOSIS — Z9889 Other specified postprocedural states: Secondary | ICD-10-CM

## 2018-06-28 DIAGNOSIS — M84351A Stress fracture, right femur, initial encounter for fracture: Secondary | ICD-10-CM | POA: Insufficient documentation

## 2018-06-28 DIAGNOSIS — X58XXXA Exposure to other specified factors, initial encounter: Secondary | ICD-10-CM | POA: Insufficient documentation

## 2018-07-14 DIAGNOSIS — M1711 Unilateral primary osteoarthritis, right knee: Secondary | ICD-10-CM | POA: Insufficient documentation

## 2018-08-05 ENCOUNTER — Ambulatory Visit (INDEPENDENT_AMBULATORY_CARE_PROVIDER_SITE_OTHER): Payer: BLUE CROSS/BLUE SHIELD | Admitting: Vascular Surgery

## 2018-08-05 ENCOUNTER — Encounter (INDEPENDENT_AMBULATORY_CARE_PROVIDER_SITE_OTHER): Payer: Self-pay | Admitting: Vascular Surgery

## 2018-08-05 VITALS — BP 131/84 | HR 99 | Resp 17 | Ht 71.0 in | Wt 266.0 lb

## 2018-08-05 DIAGNOSIS — I82431 Acute embolism and thrombosis of right popliteal vein: Secondary | ICD-10-CM | POA: Diagnosis not present

## 2018-08-05 DIAGNOSIS — M159 Polyosteoarthritis, unspecified: Secondary | ICD-10-CM

## 2018-08-05 DIAGNOSIS — M150 Primary generalized (osteo)arthritis: Secondary | ICD-10-CM

## 2018-08-05 DIAGNOSIS — I10 Essential (primary) hypertension: Secondary | ICD-10-CM | POA: Diagnosis not present

## 2018-08-08 ENCOUNTER — Encounter (INDEPENDENT_AMBULATORY_CARE_PROVIDER_SITE_OTHER): Payer: Self-pay

## 2018-08-12 ENCOUNTER — Telehealth (INDEPENDENT_AMBULATORY_CARE_PROVIDER_SITE_OTHER): Payer: Self-pay | Admitting: Vascular Surgery

## 2018-08-16 ENCOUNTER — Encounter (INDEPENDENT_AMBULATORY_CARE_PROVIDER_SITE_OTHER): Payer: Self-pay | Admitting: Vascular Surgery

## 2018-08-16 DIAGNOSIS — M159 Polyosteoarthritis, unspecified: Secondary | ICD-10-CM | POA: Insufficient documentation

## 2018-08-16 NOTE — Progress Notes (Signed)
MRN : [id number]  Louis Salazar is a 62 y.o. (Jun 30, 1956) male who presents with chief complaint of  Chief Complaint  Patient presents with  . Follow-up    Discuss IVC filter removal  .  History of Present Illness:   The patient presents to the office for evaluation of past DVT in association with DJD requiring right knee joint replacement surgery.  DVT was identified in the past and he is currently being treated with anticoagulation.  The presenting symptoms were pain and swelling in the lower extremity.  The patient notes the leg continues to be mildly painful with dependency and still swells quite a bite.  Symptoms are much better with elevation.  The patient notes minimal edema in the morning which steadily worsens throughout the day.    The patient has not been using compression therapy at this point.  No SOB or pleuritic chest pains.  No cough or hemoptysis.  No blood per rectum or blood in any sputum.  No excessive bruising per the patient.     Current Meds  Medication Sig  . acetaminophen (TYLENOL) 500 MG tablet Take 1,000 mg by mouth every 6 (six) hours as needed for moderate pain or headache.   . clomiPHENE (CLOMID) 50 MG tablet Take by mouth every other day. Every Monday Wednesday friday  . ELIQUIS STARTER PACK (ELIQUIS STARTER PACK) 5 MG TABS Take as directed on package: start with two-5mg  tablets twice daily for 7 days. On day 8, switch to one-5mg  tablet twice daily.  . finasteride (PROSCAR) 5 MG tablet Take 5 mg by mouth at bedtime.   . fluorometholone (FML) 0.1 % ophthalmic suspension   . metoprolol succinate (TOPROL-XL) 25 MG 24 hr tablet Take 1 tablet (25 mg total) by mouth 2 (two) times daily.  . tamsulosin (FLOMAX) 0.4 MG CAPS capsule Take 0.4 mg by mouth at bedtime.   . temazepam (RESTORIL) 30 MG capsule Take 30 mg by mouth at bedtime.   Marland Kitchen testosterone (ANDRODERM) 4 MG/24HR PT24 patch Androderm 4 mg/24 hr transdermal 24 hour patch  . traMADol (ULTRAM) 50 MG  tablet Take 50 mg by mouth every 6 (six) hours as needed for moderate pain.   . [DISCONTINUED] Menthol, Topical Analgesic, (BENGAY EX) Apply 1 application topically daily as needed (knee pain).    Past Medical History:  Diagnosis Date  . Arthritis    right shoulder  . Enlarged prostate   . Foreign body, eye    left eyelid  . GERD (gastroesophageal reflux disease)    nissan plication and this has improved  . Hemorrhoid   . Hypertension    controlled on med  . Neck pain    "EXPERIENCE CRICK IN NECK". comes and goes 2019  . Neuromuscular disorder ([place])    hands are tingly and go numb. possibly carpal tunnel.  right hand  . Pre-diabetes    A1C in 2019 was 6.0  . Substance abuse ([place])    hx of inhalants, last use 2010.  regular  daily alcohol intake (2019)    Past Surgical History:  Procedure Laterality Date  . ABDOMINAL SURGERY  2229   nissen fundoplication  . BUNIONECTOMY Left 2007  . CARPAL TUNNEL RELEASE Right 03/12/2018   Procedure: CARPAL TUNNEL RELEASE;  Surgeon: Hessie Knows, MD;  Location: [facility];  Service: Orthopedics;  Laterality: Right;  . COLONOSCOPY WITH PROPOFOL N/A 10/18/2015   Procedure: COLONOSCOPY WITH PROPOFOL;  Surgeon: Lucilla Lame, MD;  Location: [place];  Service: Endoscopy;  Laterality: N/A;  . ESOPHAGOGASTRODUODENOSCOPY (EGD) WITH PROPOFOL N/A 10/18/2015   Procedure: ESOPHAGOGASTRODUODENOSCOPY (EGD) WITH PROPOFOL;  Surgeon: Lucilla Lame, MD;  Location: [place];  Service: Endoscopy;  Laterality: N/A;  wants as early as possible  . HERNIA REPAIR    . KNEE ARTHROSCOPY Right 02/18/2018   Procedure: ARTHROSCOPY KNEE;  Surgeon: Dereck Leep, MD;  Location: [facility];  Service: Orthopedics;  Laterality: Right;  . KNEE ARTHROSCOPY WITH LATERAL MENISECTOMY Right 02/18/2018   Procedure: KNEE ARTHROSCOPY WITH LATERAL MENISECTOMY;  Surgeon: Dereck Leep, MD;  Location: [facility];  Service: Orthopedics;  Laterality: Right;  . KNEE  ARTHROSCOPY WITH MEDIAL MENISECTOMY Right 02/18/2018   Procedure: KNEE ARTHROSCOPY WITH MEDIAL MENISECTOMY;  Surgeon: Dereck Leep, MD;  Location: [facility];  Service: Orthopedics;  Laterality: Right;  . NISSEN FUNDOPLICATION  6606  . SHOULDER SURGERY  04/2016   Right shoulder replacement  . UMBILICAL HERNIA REPAIR N/A 09/12/2016   Procedure: HERNIA REPAIR UMBILICAL ADULT;  Surgeon: Robert Bellow, MD;  Location: [facility];  Service: General;  Laterality: N/A;  . WRIST SURGERY Left 1990   bones fused. metal in wrist    Social History Social History   Tobacco Use  . Smoking status: Former Smoker    Packs/day: 1.00    Years: 30.00    Pack years: 30.00    Types: Cigarettes    Last attempt to quit: 11/28/1995    Years since quitting: 22.7  . Smokeless tobacco: Current User    Types: Chew  . Tobacco comment: 1 can of chew in about 3 days.last chew was 9:30 am day of surgery  Substance Use Topics  . Alcohol use: Yes    Alcohol/week: 3.0 standard drinks    Types: 3 Cans of beer per week    Comment: 3 BEERS QD or shots of liquor  . Drug use: No    Comment: PT WITH H/O INHALENTS-LAST USED 2010    Family History Family History  Problem Relation Age of Onset  . Cancer Other   . COPD Mother   . Arthritis Maternal Grandmother   . Cancer Maternal Grandmother   . Diabetes Maternal Grandmother     Allergies  Allergen Reactions  . Celebrex [Celecoxib] Hives and Rash  . Sulfa Antibiotics Other (See Comments)    Unknown - childhood allergy     REVIEW OF SYSTEMS (Negative unless checked)  Constitutional: [] Weight loss  [] Fever  [] Chills Cardiac: [] Chest pain   [] Chest pressure   [] Palpitations   [] Shortness of breath when laying flat   [] Shortness of breath with exertion. Vascular:  [] Pain in legs with walking   [] Pain in legs at rest  [x] History of DVT   [] Phlebitis   [x] Swelling in legs   [] Varicose veins   [] Non-healing ulcers Pulmonary:   [] Uses home oxygen   [] Productive  cough   [] Hemoptysis   [] Wheeze  [] COPD   [] Asthma Neurologic:  [] Dizziness   [] Seizures   [] History of stroke   [] History of TIA  [] Aphasia   [] Vissual changes   [] Weakness or numbness in arm   [] Weakness or numbness in leg Musculoskeletal:   [x] Joint swelling   [x] Joint pain   [] Low back pain Hematologic:  [x] Easy bruising  [] Easy bleeding   [] Hypercoagulable state   [] Anemic Gastrointestinal:  [] Diarrhea   [] Vomiting  [] Gastroesophageal reflux/heartburn   [] Difficulty swallowing. Genitourinary:  [] Chronic kidney disease   [] Difficult urination  [] Frequent urination   [] Blood in urine Skin:  []   Rashes   [] Ulcers  Psychological:  [] History of anxiety   []  History of major depression.  Physical Examination  Vitals:   08/05/18 1432  BP: 131/84  Pulse: 99  Resp: 17  Weight: 266 lb (120.7 kg)  Height: 5\' 11"  (1.803 m)   Body mass index is 37.1 kg/m. Gen: WD/WN, NAD Head: [place]/AT, No temporalis wasting.  Ear/Nose/Throat: Hearing grossly intact, nares w/o erythema or drainage Eyes: PER, EOMI, sclera nonicteric.  Neck: Supple, no large masses.   Pulmonary:  Good air movement, no audible wheezing bilaterally, no use of accessory muscles.  Cardiac: RRR, no JVD Vascular: scattered varicosities present bilaterally.  Mild venous stasis changes to the legs bilaterally.  2-3+ soft pitting edema Vessel Right Left  Radial Palpable Palpable  Popliteal Palpable Palpable  PT Palpable Palpable  DP Palpable Palpable  Gastrointestinal: Non-distended. No guarding/no peritoneal signs.  Musculoskeletal: M/S 5/5 throughout.  No deformity or atrophy.  Neurologic: CN 2-12 intact. Symmetrical.  Speech is fluent. Motor exam as listed above. Psychiatric: Judgment intact, Mood & affect appropriate for pt's clinical situation. Dermatologic: Venous rashes bilaterally no ulcers noted.  No changes consistent with cellulitis. Lymph : No lichenification or skin changes of chronic lymphedema.  CBC Lab Results    Component Value Date   WBC 7.7 03/23/2018   HGB 15.1 03/23/2018   HCT 45.1 03/23/2018   MCV 84.7 03/23/2018   PLT 212 03/23/2018    BMET    Component Value Date/Time   NA 138 03/23/2018 1143   NA 146 (H) 02/15/2016 0844   K 4.6 03/23/2018 1143   CL 105 03/23/2018 1143   CO2 26 03/23/2018 1143   GLUCOSE 105 (H) 03/23/2018 1143   BUN 24 (H) 03/23/2018 1143   BUN 26 (H) 02/15/2016 0844   CREATININE 1.12 03/23/2018 1143   CALCIUM 8.6 (L) 03/23/2018 1143   GFRNONAA >60 03/23/2018 1143   GFRAA >60 03/23/2018 1143   CrCl cannot be calculated (Patient's most recent lab result is older than the maximum 21 days allowed.).  COAG Lab Results  Component Value Date   INR 0.95 03/23/2018    Radiology No results found.   Assessment/Plan 1. Acute deep vein thrombosis (DVT) of popliteal vein of right lower extremity (HCC) The patient will continue anticoagulation for now as there have not been any problems or complications at this point.  IVC filter is strongly indicated prior to high risk orthopedic surgery.  Especially given the history of PE with the past DVT.  IVC filter placement will be done the week for surgery. Risk and benefits were reviewed the patient.  Indications for the procedure were reviewed.  All questions were answered, the patient agrees to proceed.   I have had a long discussion with the patient regarding DVT and post phlebitic changes such as swelling and why it  causes symptoms such as pain.  The patient will wear graduated compression stockings class 1 (20-30 mmHg) on a daily basis a prescription was given. The patient will  beginning wearing the stockings first thing in the morning and removing them in the evening. The patient is instructed specifically not to sleep in the stockings.  In addition, behavioral modification including elevation during the day and avoidance of prolonged dependency will be initiated.    The patient will follow-up with me in 3 months  after the joint replacement surgery to discuss removal (this was also discussed today and the patient agrees with the plan to have the filter removed).  2. Primary osteoarthritis involving multiple joints Patient we will move forward with knee replacement surgery in late October early November  3. Essential (primary) hypertension Continue antihypertensive medications as already ordered, these medications have been reviewed and there are no changes at this time.     Hortencia Pilar, MD  08/16/2018 10:44 AM

## 2018-09-02 ENCOUNTER — Other Ambulatory Visit (INDEPENDENT_AMBULATORY_CARE_PROVIDER_SITE_OTHER): Payer: Self-pay | Admitting: Vascular Surgery

## 2018-09-23 ENCOUNTER — Other Ambulatory Visit (INDEPENDENT_AMBULATORY_CARE_PROVIDER_SITE_OTHER): Payer: Self-pay | Admitting: Vascular Surgery

## 2018-09-23 MED ORDER — CEFAZOLIN SODIUM-DEXTROSE 2-4 GM/100ML-% IV SOLN
2.0000 g | Freq: Once | INTRAVENOUS | Status: AC
  Administered 2018-09-24: 2 g via INTRAVENOUS

## 2018-09-24 ENCOUNTER — Encounter
Admission: RE | Disposition: A | Discharge status: Home / Self Care | Payer: Self-pay | Source: Ambulatory Visit | Attending: Vascular Surgery

## 2018-09-24 ENCOUNTER — Ambulatory Visit
Admission: RE | Admit: 2018-09-24 | Discharge: 2018-09-24 | Disposition: A | Discharge status: Home / Self Care | Payer: BLUE CROSS/BLUE SHIELD | Source: Ambulatory Visit | Attending: Vascular Surgery | Admitting: Vascular Surgery

## 2018-09-24 ENCOUNTER — Encounter: Payer: Self-pay | Admitting: Emergency Medicine

## 2018-09-24 DIAGNOSIS — Z7901 Long term (current) use of anticoagulants: Secondary | ICD-10-CM | POA: Diagnosis not present

## 2018-09-24 DIAGNOSIS — F1811 Inhalant abuse, in remission: Secondary | ICD-10-CM | POA: Insufficient documentation

## 2018-09-24 DIAGNOSIS — F17220 Nicotine dependence, chewing tobacco, uncomplicated: Secondary | ICD-10-CM | POA: Diagnosis not present

## 2018-09-24 DIAGNOSIS — Z9889 Other specified postprocedural states: Secondary | ICD-10-CM | POA: Diagnosis not present

## 2018-09-24 DIAGNOSIS — Z86711 Personal history of pulmonary embolism: Secondary | ICD-10-CM | POA: Diagnosis not present

## 2018-09-24 DIAGNOSIS — K219 Gastro-esophageal reflux disease without esophagitis: Secondary | ICD-10-CM | POA: Insufficient documentation

## 2018-09-24 DIAGNOSIS — Z886 Allergy status to analgesic agent status: Secondary | ICD-10-CM | POA: Diagnosis not present

## 2018-09-24 DIAGNOSIS — Z79899 Other long term (current) drug therapy: Secondary | ICD-10-CM | POA: Diagnosis not present

## 2018-09-24 DIAGNOSIS — M8949 Other hypertrophic osteoarthropathy, multiple sites: Secondary | ICD-10-CM

## 2018-09-24 DIAGNOSIS — I82431 Acute embolism and thrombosis of right popliteal vein: Secondary | ICD-10-CM | POA: Insufficient documentation

## 2018-09-24 DIAGNOSIS — Z96651 Presence of right artificial knee joint: Secondary | ICD-10-CM | POA: Insufficient documentation

## 2018-09-24 DIAGNOSIS — I10 Essential (primary) hypertension: Secondary | ICD-10-CM

## 2018-09-24 DIAGNOSIS — Z96611 Presence of right artificial shoulder joint: Secondary | ICD-10-CM | POA: Insufficient documentation

## 2018-09-24 DIAGNOSIS — I82409 Acute embolism and thrombosis of unspecified deep veins of unspecified lower extremity: Secondary | ICD-10-CM

## 2018-09-24 DIAGNOSIS — N400 Enlarged prostate without lower urinary tract symptoms: Secondary | ICD-10-CM | POA: Insufficient documentation

## 2018-09-24 DIAGNOSIS — Z8261 Family history of arthritis: Secondary | ICD-10-CM | POA: Insufficient documentation

## 2018-09-24 DIAGNOSIS — R7303 Prediabetes: Secondary | ICD-10-CM | POA: Insufficient documentation

## 2018-09-24 DIAGNOSIS — D6859 Other primary thrombophilia: Secondary | ICD-10-CM

## 2018-09-24 DIAGNOSIS — Z882 Allergy status to sulfonamides status: Secondary | ICD-10-CM | POA: Insufficient documentation

## 2018-09-24 HISTORY — PX: IVC FILTER INSERTION: CATH118245

## 2018-09-24 SURGERY — IVC FILTER INSERTION
Anesthesia: Moderate Sedation

## 2018-09-24 MED ORDER — MIDAZOLAM HCL 2 MG/2ML IJ SOLN
INTRAMUSCULAR | Status: DC | PRN
  Administered 2018-09-24 (×2): 2 mg via INTRAVENOUS

## 2018-09-24 MED ORDER — DIPHENHYDRAMINE HCL 50 MG/ML IJ SOLN
INTRAMUSCULAR | Status: AC
  Filled 2018-09-24: qty 1

## 2018-09-24 MED ORDER — FENTANYL CITRATE (PF) 100 MCG/2ML IJ SOLN
INTRAMUSCULAR | Status: AC
  Filled 2018-09-24: qty 2

## 2018-09-24 MED ORDER — HEPARIN SODIUM (PORCINE) 1000 UNIT/ML IJ SOLN
INTRAMUSCULAR | Status: AC
  Filled 2018-09-24: qty 1

## 2018-09-24 MED ORDER — HYDROMORPHONE HCL 1 MG/ML IJ SOLN: 1.0000 mg | Freq: Once | INTRAMUSCULAR | Status: DC | PRN

## 2018-09-24 MED ORDER — LIDOCAINE HCL (PF) 1 % IJ SOLN
INTRAMUSCULAR | Status: AC
  Filled 2018-09-24: qty 30

## 2018-09-24 MED ORDER — SODIUM CHLORIDE 0.9 % IV SOLN
INTRAVENOUS | Status: DC
  Administered 2018-09-24: 07:00:00 via INTRAVENOUS

## 2018-09-24 MED ORDER — MIDAZOLAM HCL 5 MG/5ML IJ SOLN
INTRAMUSCULAR | Status: AC
  Filled 2018-09-24: qty 5

## 2018-09-24 MED ORDER — DIPHENHYDRAMINE HCL 50 MG/ML IJ SOLN
INTRAMUSCULAR | Status: DC | PRN
  Administered 2018-09-24: 25 mg via INTRAVENOUS

## 2018-09-24 MED ORDER — FENTANYL CITRATE (PF) 100 MCG/2ML IJ SOLN
INTRAMUSCULAR | Status: DC | PRN
  Administered 2018-09-24: 100 ug via INTRAVENOUS
  Administered 2018-09-24: 50 ug via INTRAVENOUS

## 2018-09-24 MED ORDER — SODIUM CHLORIDE FLUSH 0.9 % IV SOLN
INTRAVENOUS | Status: AC
  Filled 2018-09-24: qty 20

## 2018-09-24 MED ORDER — IOPAMIDOL (ISOVUE-300) INJECTION 61%
INTRAVENOUS | Status: DC | PRN
  Administered 2018-09-24: 15 mL via INTRA_ARTERIAL

## 2018-09-24 MED ORDER — ONDANSETRON HCL 4 MG/2ML IJ SOLN: 4.0000 mg | Freq: Four times a day (QID) | INTRAMUSCULAR | Status: DC | PRN

## 2018-09-24 MED ORDER — HEPARIN (PORCINE) IN NACL 1000-0.9 UT/500ML-% IV SOLN
INTRAVENOUS | Status: AC
  Filled 2018-09-24: qty 500

## 2018-09-24 SURGICAL SUPPLY — 3 items
KIT FEMORAL DEL DENALI (Miscellaneous) ×2 IMPLANT
PACK ANGIOGRAPHY (CUSTOM PROCEDURE TRAY) ×2 IMPLANT
WIRE J 3MM .035X145CM (WIRE) ×2 IMPLANT

## 2018-09-24 NOTE — Op Note (Signed)
[facility]   OPERATIVE NOTE    PRE-OPERATIVE DIAGNOSIS: DVT; hypercoagulable state  POST-OPERATIVE DIAGNOSIS: Same  PROCEDURE: 1.   Ultrasound guidance for vascular access to the right common femoral vein 2.   Catheter placement into the inferior vena cava 3.   Inferior venacavogram 4.   Placement of a [place] IVC filter  SURGEON: Hortencia Pilar  ASSISTANT(S): None  ANESTHESIA: Conscious sedation was administered by the interventional radiology RN under my direct supervision. IV Versed plus fentanyl were utilized. Continuous ECG, pulse oximetry and blood pressure was monitored throughout the entire procedure. Conscious sedation was for a total of 20 minutes.  ESTIMATED BLOOD LOSS: minimal  FINDING(S): 1.  Patent IVC  SPECIMEN(S):  none  INDICATIONS:   Louis Salazar is a 62 y.o. y.o. male who presents with upcoming total knee replacement surgery.  He has a history of extensive DVT and hypercoagulability and therefore is at high risk for lethal pulmonary embolism surrounding his orthopedic surgery.  Inferior vena cava filter is indicated for this reason.  Risks and benefits including filter thrombosis, migration, fracture, bleeding, and infection were all discussed.  We discussed that all IVC filters that we place can be removed if desired from the patient once the need for the filter has passed.    DESCRIPTION: After obtaining full informed written consent, the patient was brought back to the vascular suite. The skin was sterilely prepped and draped in a sterile surgical field was created. Ultrasound was placed in a sterile sleeve. The right common femoral was echolucent and compressible indicating patency. Image was recorded for the permanent record. The puncture was made under continuous real-time ultrasound guidance.  The right common femoral was accessed under direct ultrasound guidance without difficulty with a micropuncture needle. Microwire was then  advanced under fluoroscopic guidance without difficulty. Micro-sheath was then inserted and a J-wire was then placed. The dilator is passed over the wire and the delivery sheath was placed into the inferior vena cava.  Inferior venacavogram was performed. This demonstrated a patent IVC with the level of the renal veins at L1.  The filter was then deployed into the inferior vena cava at the level of L2 just below the renal veins. The delivery sheath was then removed. Pressure was held. Sterile dressings were placed. The patient tolerated the procedure well and was taken to the recovery room in stable condition.  Interpretation: Inferior vena cava is opacified with a bolus injection contrast.  Renal blushes are noted at the L2 level.  Vena cava measures approximately 20 mm in diameter.  IVC filter deployed in good orientation.  COMPLICATIONS: None  CONDITION: Stable  Hortencia Pilar  09/24/2018, 8:53 AM

## 2018-09-24 NOTE — Discharge Instructions (Signed)
Inferior Vena Cava Filter Insertion, Care After °This sheet gives you information about how to care for yourself after your procedure. Your health care provider may also give you more specific instructions. If you have problems or questions, contact your health care provider. °What can I expect after the procedure? °After your procedure, it is common to have: °· Mild pain in the area where the filter was inserted. °· Mild bruising in the area where the filter was inserted. ° °Follow these instructions at home: °Insertion site care °· Follow instructions from your health care provider about how to take care of the site where a catheter was inserted at your neck or groin (insertion site). Make sure you: °? Wash your hands with soap and water before you change your bandage (dressing). If soap and water are not available, use hand sanitizer. °? Change your dressing as told by your health care provider. °· Check your insertion site every day for signs of infection. Check for: °? More redness, swelling, or pain. °? More fluid or blood. °? Warmth. °? Pus or a bad smell. °· Keep the insertion site clean and dry. °· Do not shower, bathe, use a hot tub, or let the dressing get wet until your health care provider approves. °General instructions °· Take over-the-counter and prescription medicines only as told by your health care provider. °· Avoid heavy lifting or hard activities for 48 hours after the procedure or as told by your health care provider. °· Do not drive for 24 hours if you were given a a medicine to help you relax (sedative). °· Do not drive or use heavy machinery while taking prescription pain medicine. °· Do not go back to school or work until your health care provider approves. °· Keep all follow-up visits as told by your health care provider. This is important. °Contact a health care provider if: °· You have more redness, swelling, or pain around your insertion site. °· You have more fluid or blood coming  from your insertion site. °· Your insertion site feels warm to the touch. °· You have pus or a bad smell coming from your insertion site. °· You have a fever. °· You are dizzy. °· You have nausea and vomiting. °· You develop a rash. °Get help right away if: °· You develop chest pain, a cough, or difficulty breathing. °· You develop shortness of breath, feel faint, or pass out. °· You cough up blood. °· You have severe pain in your abdomen. °· You develop swelling and discoloration or pain in your legs. °· Your legs become pale and cold or blue. °· You develop weakness, difficulty moving your arms or legs, or balance problems. °· You develop problems with speech or vision. °These symptoms may represent a serious problem that is an emergency. Do not wait to see if the symptoms will go away. Get medical help right away. Call your local emergency services (911 in the [place] Do not drive yourself to the hospital. °Summary °· After your insertion procedure, it is common to have mild pain and bruising. °· Do not shower, bathe, use a hot tub, or let the dressing get wet until your health care provider approves. °· Every day, check for signs of infection where a catheter was inserted at your neck or groin (insertion site). °This information is not intended to replace advice given to you by your health care provider. Make sure you discuss any questions you have with your health care provider. °Document Released: 09/03/2013 Document   Revised: 10/04/2016 Document Reviewed: 10/04/2016 °Elsevier Interactive Patient Education © 2017 [employer] °Moderate Conscious Sedation, Adult, Care After °These instructions provide you with information about caring for yourself after your procedure. Your health care provider may also give you more specific instructions. Your treatment has been planned according to current medical practices, but problems sometimes occur. Call your health care provider if you have any problems or questions  after your procedure. °What can I expect after the procedure? °After your procedure, it is common: °· To feel sleepy for several hours. °· To feel clumsy and have poor balance for several hours. °· To have poor judgment for several hours. °· To vomit if you eat too soon. ° °Follow these instructions at home: °For at least 24 hours after the procedure: ° °· Do not: °? Participate in activities where you could fall or become injured. °? Drive. °? Use heavy machinery. °? Drink alcohol. °? Take sleeping pills or medicines that cause drowsiness. °? Make important decisions or sign legal documents. °? Take care of children on your own. °· Rest. °Eating and drinking °· Follow the diet recommended by your health care provider. °· If you vomit: °? Drink water, juice, or soup when you can drink without vomiting. °? Make sure you have little or no nausea before eating solid foods. °General instructions °· Have a responsible adult stay with you until you are awake and alert. °· Take over-the-counter and prescription medicines only as told by your health care provider. °· If you smoke, do not smoke without supervision. °· Keep all follow-up visits as told by your health care provider. This is important. °Contact a health care provider if: °· You keep feeling nauseous or you keep vomiting. °· You feel light-headed. °· You develop a rash. °· You have a fever. °Get help right away if: °· You have trouble breathing. °This information is not intended to replace advice given to you by your health care provider. Make sure you discuss any questions you have with your health care provider. °Document Released: 09/03/2013 Document Revised: 04/17/2016 Document Reviewed: 03/04/2016 °Elsevier Interactive Patient Education © 2018 [employer] ° °

## 2018-09-24 NOTE — H&P (Signed)
@LOGO @   MRN : [id number]  Louis Salazar is a 62 y.o. (1956/01/17) male who presents with chief complaint of No chief complaint on file. Marland Kitchen  History of Present Illness:   The patient presents to [facility] prior to his right knee joint replacement surgery.  DVT was identified in the remote past and he has been treated with anticoagulation since that time.  The presenting symptoms were pain and swelling in the lower extremity.  The patient notes the leg continues to be mildly painful with dependency and still swells quite a bite.  Symptoms are much better with elevation.  The patient notes minimal edema in the morning which steadily worsens throughout the day.    The patient has not been using compression therapy at this point.  No SOB or pleuritic chest pains.  No cough or hemoptysis.  No blood per rectum or blood in any sputum.  No excessive bruising per the patient.  Current Meds  Medication Sig  . clomiPHENE (CLOMID) 50 MG tablet Take 25 mg by mouth daily.   Marland Kitchen ELIQUIS 5 MG TABS tablet TAKE 1 TABLET BY MOUTH TWICE DAILY  . finasteride (PROSCAR) 5 MG tablet Take 5 mg by mouth at bedtime.   Marland Kitchen loperamide (IMODIUM) 2 MG capsule Take 2 mg by mouth daily.  . metoprolol succinate (TOPROL-XL) 25 MG 24 hr tablet Take 1 tablet (25 mg total) by mouth 2 (two) times daily.  . Multiple Vitamins-Minerals (CENTRUM SILVER PO) Take 1 tablet by mouth daily.   . tamsulosin (FLOMAX) 0.4 MG CAPS capsule Take 0.4 mg by mouth at bedtime.   . temazepam (RESTORIL) 30 MG capsule Take 30 mg by mouth at bedtime.   . traMADol (ULTRAM) 50 MG tablet Take 50 mg by mouth daily as needed for moderate pain.     Past Medical History:  Diagnosis Date  . Arthritis    right shoulder  . Enlarged prostate   . Foreign body, eye    left eyelid  . GERD (gastroesophageal reflux disease)    nissan plication and this has improved  . Hemorrhoid   . Hypertension    controlled on med  . Neck  pain    "EXPERIENCE CRICK IN NECK". comes and goes 2019  . Neuromuscular disorder ([place])    hands are tingly and go numb. possibly carpal tunnel.  right hand  . Pre-diabetes    A1C in 2019 was 6.0  . Substance abuse ([place])    hx of inhalants, last use 2010.  regular  daily alcohol intake (2019)    Past Surgical History:  Procedure Laterality Date  . ABDOMINAL SURGERY  7371   nissen fundoplication  . BUNIONECTOMY Left 2007  . CARPAL TUNNEL RELEASE Right 03/12/2018   Procedure: CARPAL TUNNEL RELEASE;  Surgeon: Hessie Knows, MD;  Location: [facility];  Service: Orthopedics;  Laterality: Right;  . COLONOSCOPY WITH PROPOFOL N/A 10/18/2015   Procedure: COLONOSCOPY WITH PROPOFOL;  Surgeon: Lucilla Lame, MD;  Location: [place];  Service: Endoscopy;  Laterality: N/A;  . ESOPHAGOGASTRODUODENOSCOPY (EGD) WITH PROPOFOL N/A 10/18/2015   Procedure: ESOPHAGOGASTRODUODENOSCOPY (EGD) WITH PROPOFOL;  Surgeon: Lucilla Lame, MD;  Location: [place];  Service: Endoscopy;  Laterality: N/A;  wants as early as possible  . HERNIA REPAIR    . KNEE ARTHROSCOPY Right 02/18/2018   Procedure: ARTHROSCOPY KNEE;  Surgeon: Dereck Leep, MD;  Location: [facility];  Service: Orthopedics;  Laterality: Right;  . KNEE ARTHROSCOPY WITH LATERAL MENISECTOMY  Right 02/18/2018   Procedure: KNEE ARTHROSCOPY WITH LATERAL MENISECTOMY;  Surgeon: Dereck Leep, MD;  Location: [facility];  Service: Orthopedics;  Laterality: Right;  . KNEE ARTHROSCOPY WITH MEDIAL MENISECTOMY Right 02/18/2018   Procedure: KNEE ARTHROSCOPY WITH MEDIAL MENISECTOMY;  Surgeon: Dereck Leep, MD;  Location: [facility];  Service: Orthopedics;  Laterality: Right;  . NISSEN FUNDOPLICATION  4627  . SHOULDER SURGERY  04/2016   Right shoulder replacement  . UMBILICAL HERNIA REPAIR N/A 09/12/2016   Procedure: HERNIA REPAIR UMBILICAL ADULT;  Surgeon: Robert Bellow, MD;  Location: [facility];  Service: General;  Laterality: N/A;  . WRIST SURGERY  Left 1990   bones fused. metal in wrist    Social History Social History   Tobacco Use  . Smoking status: Former Smoker    Packs/day: 1.00    Years: 30.00    Pack years: 30.00    Types: Cigarettes    Last attempt to quit: 11/28/1995    Years since quitting: 22.8  . Smokeless tobacco: Current User    Types: Chew  . Tobacco comment: 1 can of chew in about 3 days.last chew was 9:30 am day of surgery  Substance Use Topics  . Alcohol use: Yes    Alcohol/week: 3.0 standard drinks    Types: 3 Cans of beer per week    Comment: 3 BEERS QD or shots of liquor  . Drug use: No    Comment: PT WITH H/O INHALENTS-LAST USED 2010    Family History Family History  Problem Relation Age of Onset  . Cancer Other   . COPD Mother   . Arthritis Maternal Grandmother   . Cancer Maternal Grandmother   . Diabetes Maternal Grandmother     Allergies  Allergen Reactions  . Celebrex [Celecoxib] Hives and Rash  . Sulfa Antibiotics Other (See Comments)    Unknown - childhood allergy     REVIEW OF SYSTEMS (Negative unless checked)  Constitutional: [] Weight loss  [] Fever  [] Chills Cardiac: [] Chest pain   [] Chest pressure   [] Palpitations   [] Shortness of breath when laying flat   [] Shortness of breath with exertion. Vascular:  [] Pain in legs with walking   [x] Pain in legs at rest  [x] History of DVT   [] Phlebitis   [x] Swelling in legs   [] Varicose veins   [] Non-healing ulcers Pulmonary:   [] Uses home oxygen   [] Productive cough   [] Hemoptysis   [] Wheeze  [] COPD   [] Asthma Neurologic:  [] Dizziness   [] Seizures   [] History of stroke   [] History of TIA  [] Aphasia   [] Vissual changes   [] Weakness or numbness in arm   [] Weakness or numbness in leg Musculoskeletal:   [x] Joint swelling   [x] Joint pain   [] Low back pain Hematologic:  [] Easy bruising  [] Easy bleeding   [] Hypercoagulable state   [] Anemic Gastrointestinal:  [] Diarrhea   [] Vomiting  [] Gastroesophageal reflux/heartburn   [] Difficulty  swallowing. Genitourinary:  [] Chronic kidney disease   [] Difficult urination  [] Frequent urination   [] Blood in urine Skin:  [] Rashes   [] Ulcers  Psychological:  [] History of anxiety   []  History of major depression.  Physical Examination  Vitals:   09/24/18 0648  BP: (!) 137/95  Pulse: 97  Resp: 14  Temp: 97.8 F (36.6 C)  TempSrc: Oral  SpO2: 93%  Weight: 121.6 kg  Height: 5\' 11"  (1.803 m)   Body mass index is 37.38 kg/m. Gen: WD/WN, NAD Head: [place]/AT, No temporalis wasting.  Ear/Nose/Throat: Hearing grossly intact, nares w/o  erythema or drainage Eyes: PER, EOMI, sclera nonicteric.  Neck: Supple, no large masses.   Pulmonary:  Good air movement, no audible wheezing bilaterally, no use of accessory muscles.  Cardiac: RRR, no JVD Vascular:  Vessel Right Left  Radial Palpable Palpable  PT Palpable Palpable  DP Palpable Palpable  Gastrointestinal: Non-distended. No guarding/no peritoneal signs.  Musculoskeletal: M/S 5/5 throughout.  Arthritic deformity multiple joints no atrophy.  Neurologic: CN 2-12 intact. Symmetrical.  Speech is fluent. Motor exam as listed above. Psychiatric: Judgment intact, Mood & affect appropriate for pt's clinical situation. Dermatologic: No rashes or ulcers noted.  No changes consistent with cellulitis. Lymph : No lichenification or skin changes of chronic lymphedema.  CBC Lab Results  Component Value Date   WBC 7.7 03/23/2018   HGB 15.1 03/23/2018   HCT 45.1 03/23/2018   MCV 84.7 03/23/2018   PLT 212 03/23/2018    BMET    Component Value Date/Time   NA 138 03/23/2018 1143   NA 146 (H) 02/15/2016 0844   K 4.6 03/23/2018 1143   CL 105 03/23/2018 1143   CO2 26 03/23/2018 1143   GLUCOSE 105 (H) 03/23/2018 1143   BUN 24 (H) 03/23/2018 1143   BUN 26 (H) 02/15/2016 0844   CREATININE 1.12 03/23/2018 1143   CALCIUM 8.6 (L) 03/23/2018 1143   GFRNONAA >60 03/23/2018 1143   GFRAA >60 03/23/2018 1143   CrCl cannot be calculated (Patient's  most recent lab result is older than the maximum 21 days allowed.).  COAG Lab Results  Component Value Date   INR 0.95 03/23/2018    Radiology No results found.    Assessment/Plan 1. Acute deep vein thrombosis (DVT) of popliteal vein of right lower extremity (HCC) The patient will continue anticoagulation for now as there have not been any problems or complications at this point.  IVC filter is strongly indicated prior to high risk orthopedic surgery.  Especially given the history of PE with the past DVT.  IVC filter placement will be done the week for surgery. Risk and benefits were reviewed the patient.  Indications for the procedure were reviewed.  All questions were answered, the patient agrees to proceed.   I have had a long discussion with the patient regarding DVT and post phlebitic changes such as swelling and why it  causes symptoms such as pain.  The patient will wear graduated compression stockings class 1 (20-30 mmHg) on a daily basis a prescription was given. The patient will  beginning wearing the stockings first thing in the morning and removing them in the evening. The patient is instructed specifically not to sleep in the stockings.  In addition, behavioral modification including elevation during the day and avoidance of prolonged dependency will be initiated.    The patient will follow-up with me in 3 months after the joint replacement surgery to discuss removal (this was also discussed today and the patient agrees with the plan to have the filter removed).    2. Primary osteoarthritis involving multiple joints Patient we will move forward with knee replacement surgery in late October early November  3. Essential (primary) hypertension Continue antihypertensive medications as already ordered, these medications have been reviewed and there are no changes at this time.    Hortencia Pilar, MD  09/24/2018 7:57 AM

## 2018-09-25 ENCOUNTER — Other Ambulatory Visit: Payer: Self-pay

## 2018-09-25 ENCOUNTER — Encounter
Admission: RE | Admit: 2018-09-25 | Discharge: 2018-09-25 | Disposition: A | Discharge status: Home / Self Care | Payer: BLUE CROSS/BLUE SHIELD | Source: Ambulatory Visit | Attending: Orthopedic Surgery | Admitting: Orthopedic Surgery

## 2018-09-25 DIAGNOSIS — Z01810 Encounter for preprocedural cardiovascular examination: Secondary | ICD-10-CM | POA: Diagnosis not present

## 2018-09-25 DIAGNOSIS — Z01818 Encounter for other preprocedural examination: Secondary | ICD-10-CM | POA: Insufficient documentation

## 2018-09-25 DIAGNOSIS — I10 Essential (primary) hypertension: Secondary | ICD-10-CM | POA: Insufficient documentation

## 2018-09-25 HISTORY — DX: Personal history of colonic polyps: Z86.010

## 2018-09-25 HISTORY — DX: Umbilical hernia without obstruction or gangrene: K42.9

## 2018-09-25 HISTORY — DX: Gastritis, unspecified, without bleeding: K29.70

## 2018-09-25 HISTORY — DX: Major depressive disorder, single episode, unspecified: F32.9

## 2018-09-25 HISTORY — DX: Acute embolism and thrombosis of unspecified deep veins of unspecified lower extremity: I82.409

## 2018-09-25 LAB — CBC
HEMATOCRIT: 48.8 % (ref 39.0–52.0)
HEMOGLOBIN: 15.3 g/dL (ref 13.0–17.0)
MCH: 27.7 pg (ref 26.0–34.0)
MCHC: 31.4 g/dL (ref 30.0–36.0)
MCV: 88.2 fL (ref 80.0–100.0)
NRBC: 0 % (ref 0.0–0.2)
PLATELETS: 208 10*3/uL (ref 150–400)
RBC: 5.53 MIL/uL (ref 4.22–5.81)
RDW: 14.6 % (ref 11.5–15.5)
WBC: 7.6 10*3/uL (ref 4.0–10.5)

## 2018-09-25 LAB — APTT: APTT: 32 s (ref 24–36)

## 2018-09-25 LAB — COMPREHENSIVE METABOLIC PANEL
ALT: 9 U/L (ref 0–44)
AST: 16 U/L (ref 15–41)
Albumin: 3.9 g/dL (ref 3.5–5.0)
Alkaline Phosphatase: 46 U/L (ref 38–126)
Anion gap: 7 (ref 5–15)
BUN: 22 mg/dL (ref 8–23)
CHLORIDE: 109 mmol/L (ref 98–111)
CO2: 25 mmol/L (ref 22–32)
CREATININE: 0.91 mg/dL (ref 0.61–1.24)
Calcium: 8.7 mg/dL — ABNORMAL LOW (ref 8.9–10.3)
Glucose, Bld: 112 mg/dL — ABNORMAL HIGH (ref 70–99)
POTASSIUM: 4.3 mmol/L (ref 3.5–5.1)
Sodium: 141 mmol/L (ref 135–145)
Total Bilirubin: 0.6 mg/dL (ref 0.3–1.2)
Total Protein: 6.9 g/dL (ref 6.5–8.1)

## 2018-09-25 LAB — URINALYSIS, ROUTINE W REFLEX MICROSCOPIC
Bilirubin Urine: NEGATIVE
GLUCOSE, UA: NEGATIVE mg/dL
Hgb urine dipstick: NEGATIVE
Ketones, ur: NEGATIVE mg/dL
LEUKOCYTES UA: NEGATIVE
Nitrite: NEGATIVE
PH: 5 (ref 5.0–8.0)
Protein, ur: NEGATIVE mg/dL
SPECIFIC GRAVITY, URINE: 1.023 (ref 1.005–1.030)

## 2018-09-25 LAB — PROTIME-INR
INR: 1.05
PROTHROMBIN TIME: 13.6 s (ref 11.4–15.2)

## 2018-09-25 LAB — TYPE AND SCREEN
ABO/RH(D): A NEG
ANTIBODY SCREEN: NEGATIVE

## 2018-09-25 LAB — SURGICAL PCR SCREEN
MRSA, PCR: NEGATIVE
Staphylococcus aureus: NEGATIVE

## 2018-09-25 LAB — C-REACTIVE PROTEIN: CRP: <0.8 mg/dL (ref <1.0)

## 2018-09-25 LAB — SEDIMENTATION RATE: Sed Rate: 2 mm/hr (ref 0–20)

## 2018-09-25 NOTE — Discharge Instructions (Signed)
°  Instructions after Total Knee Replacement ° ° Mansa Willers P. Aubrey Voong, Jr., M.D.    ° Dept. of Orthopaedics & Sports Medicine ° [facility] ° [address] ° [place], [place]  [address] ° Phone: [phone]   Fax: [phone] ° °  °DIET: °• Drink plenty of non-alcoholic fluids. °• Resume your normal diet. Include foods high in fiber. ° °ACTIVITY:  °• You may use crutches or a walker with weight-bearing as tolerated, unless instructed otherwise. °• You may be weaned off of the walker or crutches by your Physical Therapist.  °• Do NOT place pillows under the knee. Anything placed under the knee could limit your ability to straighten the knee.   °• Continue doing gentle exercises. Exercising will reduce the pain and swelling, increase motion, and prevent muscle weakness.   °• Please continue to use the TED compression stockings for 6 weeks. You may remove the stockings at night, but should reapply them in the morning. °• Do not drive or operate any equipment until instructed. ° °WOUND CARE:  °• Continue to use the PolarCare or ice packs periodically to reduce pain and swelling. °• You may bathe or shower after the staples are removed at the first office visit following surgery. ° °MEDICATIONS: °• You may resume your regular medications. °• Please take the pain medication as prescribed on the medication. °• Do not take pain medication on an empty stomach. °• You have been given a prescription for a blood thinner (Lovenox or Coumadin). Please take the medication as instructed. (NOTE: After completing a 2 week course of Lovenox, take one Enteric-coated aspirin once a day. This along with elevation will help reduce the possibility of phlebitis in your operated leg.) °• Do not drive or drink alcoholic beverages when taking pain medications. ° °CALL THE OFFICE FOR: °• Temperature above 101 degrees °• Excessive bleeding or drainage on the dressing. °• Excessive swelling, coldness, or paleness of the toes. °• Persistent  nausea and vomiting. ° °FOLLOW-UP:  °• You should have an appointment to return to the office in 10-14 days after surgery. °• Arrangements have been made for continuation of Physical Therapy (either home therapy or outpatient therapy). °  °

## 2018-09-25 NOTE — Pre-Procedure Instructions (Signed)
Clearance form for when patient can stop Eliquis was faxed to Dr. Clydell Hakim and Dr. Hal Morales office. Patient has an appointment with Dr. Delana Meyer on Monday, November 4. Patient instructed to follow recommendation of Dr. Delana Meyer regarding stopping the Eliquis.

## 2018-09-25 NOTE — Patient Instructions (Signed)
Your procedure is scheduled on: Monday, October 07, 2018 Report to Day Surgery on the 2nd floor of the [facility]. To find out your arrival time, please call [phone] between 1PM - 3PM on: Friday, October 04, 2018  REMEMBER: Instructions that are not followed completely may result in serious medical risk, up to and including death; or upon the discretion of your surgeon and anesthesiologist your surgery may need to be rescheduled.  Do not eat food after midnight the night before surgery.  No gum chewing, lozengers or hard candies.  You may however, drink CLEAR liquids up to 2 hours before you are scheduled to arrive for your surgery. Do not drink anything within 2 hours of the start of your surgery.  Clear liquids include: - water  - apple juice without pulp - gatorade - black coffee or tea (Do NOT add milk or creamers to the coffee or tea) Do NOT drink anything that is not on this list.  No Alcohol for 24 hours before or after surgery.  No Smoking including e-cigarettes for 24 hours prior to surgery.  No chewable tobacco products for at least 6 hours prior to surgery.  No nicotine patches on the day of surgery.  On the morning of surgery brush your teeth with toothpaste and water, you may rinse your mouth with mouthwash if you wish. Do not swallow any toothpaste or mouthwash.  Notify your doctor if there is any change in your medical condition (cold, fever, infection).  Do not wear jewelry, make-up, hairpins, clips or nail polish.  Do not wear lotions, powders, or perfumes.   Do not shave 48 hours prior to surgery.   Contacts and dentures may not be worn into surgery.  Do not bring valuables to the hospital, including drivers license, insurance or credit cards.  [facility] is not responsible for any belongings or valuables.   TAKE THESE MEDICATIONS THE MORNING OF SURGERY:  1.  Metoprolol  Use CHG Soap as directed on instruction sheet.  Follow recommendations  from Dr. Delana Meyer regarding stopping Eliquis.  ON November 4 - Stop Anti-inflammatories (NSAIDS) such as Advil, Aleve, Ibuprofen, Motrin, Naproxen, Naprosyn and Aspirin based products such as Excedrin, Goodys Powder, BC Powder. (May take Tylenol or Acetaminophen if needed.)  ON November 4 - Stop ANY OVER THE COUNTER supplements until after surgery. (May continue multivitamin.)  Wear comfortable clothing (specific to your surgery type) to the hospital.  If you are being admitted to the hospital overnight, leave your suitcase in the car. After surgery it may be brought to your room.  Please call [phone] if you have any questions about these instructions.

## 2018-09-26 LAB — URINE CULTURE
CULTURE: NO GROWTH
Special Requests: NORMAL

## 2018-09-30 ENCOUNTER — Encounter (INDEPENDENT_AMBULATORY_CARE_PROVIDER_SITE_OTHER): Payer: BLUE CROSS/BLUE SHIELD

## 2018-09-30 ENCOUNTER — Ambulatory Visit (INDEPENDENT_AMBULATORY_CARE_PROVIDER_SITE_OTHER): Payer: BLUE CROSS/BLUE SHIELD | Admitting: Vascular Surgery

## 2018-09-30 ENCOUNTER — Ambulatory Visit (INDEPENDENT_AMBULATORY_CARE_PROVIDER_SITE_OTHER): Payer: BLUE CROSS/BLUE SHIELD

## 2018-09-30 ENCOUNTER — Encounter (INDEPENDENT_AMBULATORY_CARE_PROVIDER_SITE_OTHER): Payer: Self-pay | Admitting: Vascular Surgery

## 2018-09-30 VITALS — BP 147/92 | HR 83 | Resp 19 | Ht 71.0 in | Wt 263.0 lb

## 2018-09-30 DIAGNOSIS — I82431 Acute embolism and thrombosis of right popliteal vein: Secondary | ICD-10-CM

## 2018-09-30 NOTE — Progress Notes (Signed)
MRN : [id number]  Louis Salazar is a 61 y.o. (12-Sep-1956) male who presents with chief complaint of  Chief Complaint  Patient presents with  . Follow-up    6 month Right DVT f/u  .  History of Present Illness:   The patient presents to the office for evaluation of DVT.  DVT was identified at [facility] by Duplex ultrasound.  The initial symptoms were pain and swelling in the lower extremity.  PROCEDURE 09/24/2018: 1.   Ultrasound guidance for vascular access to the right common femoral vein 2.   Catheter placement into the inferior vena cava 3.   Inferior venacavogram 4.   Placement of a [place] IVC filter  The patient notes the leg continues to be very painful with dependency and swells quite a bite.  Symptoms are much better with elevation.  The patient notes minimal edema in the morning which steadily worsens throughout the day.    The patient has not been using compression therapy at this point.  No SOB or pleuritic chest pains.  No cough or hemoptysis.  No blood per rectum or blood in any sputum.  No excessive bruising per the patient.     Current Meds  Medication Sig  . aspirin 325 MG EC tablet Take 650 mg by mouth daily as needed for pain.  . clomiPHENE (CLOMID) 50 MG tablet Take 25 mg by mouth daily.   Marland Kitchen ELIQUIS 5 MG TABS tablet TAKE 1 TABLET BY MOUTH TWICE DAILY  . finasteride (PROSCAR) 5 MG tablet Take 5 mg by mouth at bedtime.   Marland Kitchen ibuprofen (ADVIL,MOTRIN) 200 MG tablet Take 400 mg by mouth daily as needed for headache or moderate pain.  Marland Kitchen loperamide (IMODIUM) 2 MG capsule Take 2 mg by mouth daily.  . metoprolol succinate (TOPROL-XL) 25 MG 24 hr tablet Take 1 tablet (25 mg total) by mouth 2 (two) times daily.  . Multiple Vitamins-Minerals (CENTRUM SILVER PO) Take 1 tablet by mouth daily.   . tamsulosin (FLOMAX) 0.4 MG CAPS capsule Take 0.4 mg by mouth at bedtime.   . temazepam (RESTORIL) 30 MG capsule Take 30 mg by mouth at bedtime.   Marland Kitchen testosterone (ANDRODERM)  4 MG/24HR PT24 patch Place 1 patch onto the skin every 14 (fourteen) days.   . traMADol (ULTRAM) 50 MG tablet Take 50 mg by mouth daily as needed for moderate pain.     Past Medical History:  Diagnosis Date  . Arthritis    right shoulder; right knee  . Depression 02/28/2016  . DVT (deep venous thrombosis) ([place]) 01/2018   RIGHT POPLITEAL  . Enlarged prostate   . Foreign body, eye    left eyelid  . Gastritis 10/24/2017  . GERD (gastroesophageal reflux disease)    nissan plication and this has improved  . Hemorrhoid   . History of colon polyps 05/24/2015  . Hypertension    controlled on med  . Neck pain    "EXPERIENCE CRICK IN NECK". comes and goes 2019  . Neuromuscular disorder ([place])    hands are tingly and go numb. possibly carpal tunnel.  right hand  . Pre-diabetes    A1C in 2019 was 6.0  . Substance abuse ([place])    hx of inhalants, last use 2010.  regular  daily alcohol intake (2019)  . Umbilical hernia without obstruction and without gangrene 08/24/2016    Past Surgical History:  Procedure Laterality Date  . ABDOMINAL SURGERY  4627   nissen fundoplication  . BUNIONECTOMY  Left 2007  . CARPAL TUNNEL RELEASE Right 03/12/2018   Procedure: CARPAL TUNNEL RELEASE;  Surgeon: Hessie Knows, MD;  Location: [facility];  Service: Orthopedics;  Laterality: Right;  . COLONOSCOPY WITH PROPOFOL N/A 10/18/2015   Procedure: COLONOSCOPY WITH PROPOFOL;  Surgeon: Lucilla Lame, MD;  Location: [place];  Service: Endoscopy;  Laterality: N/A;  . ESOPHAGOGASTRODUODENOSCOPY (EGD) WITH PROPOFOL N/A 10/18/2015   Procedure: ESOPHAGOGASTRODUODENOSCOPY (EGD) WITH PROPOFOL;  Surgeon: Lucilla Lame, MD;  Location: [place];  Service: Endoscopy;  Laterality: N/A;  wants as early as possible  . HERNIA REPAIR    . IVC FILTER INSERTION N/A 09/24/2018   Procedure: IVC FILTER INSERTION;  Surgeon: [name], MD;  Location: [place] [facility];  Service: Cardiovascular;  Laterality:  N/A;  . KNEE ARTHROSCOPY Right 02/18/2018   Procedure: ARTHROSCOPY KNEE;  Surgeon: Dereck Leep, MD;  Location: [facility];  Service: Orthopedics;  Laterality: Right;  . KNEE ARTHROSCOPY WITH LATERAL MENISECTOMY Right 02/18/2018   Procedure: KNEE ARTHROSCOPY WITH LATERAL MENISECTOMY;  Surgeon: Dereck Leep, MD;  Location: [facility];  Service: Orthopedics;  Laterality: Right;  . KNEE ARTHROSCOPY WITH MEDIAL MENISECTOMY Right 02/18/2018   Procedure: KNEE ARTHROSCOPY WITH MEDIAL MENISECTOMY;  Surgeon: Dereck Leep, MD;  Location: [facility];  Service: Orthopedics;  Laterality: Right;  . NISSEN FUNDOPLICATION  0630  . SHOULDER SURGERY  04/2016   Right shoulder replacement  . UMBILICAL HERNIA REPAIR N/A 09/12/2016   Procedure: HERNIA REPAIR UMBILICAL ADULT;  Surgeon: Robert Bellow, MD;  Location: [facility];  Service: General;  Laterality: N/A;  . WRIST SURGERY Left 1990   bones fused. metal in wrist    Social History Social History   Tobacco Use  . Smoking status: Former Smoker    Packs/day: 1.00    Years: 30.00    Pack years: 30.00    Types: Cigarettes    Last attempt to quit: 11/28/1995    Years since quitting: 22.8  . Smokeless tobacco: Current User    Types: Snuff  . Tobacco comment: 1 can of chew in about 3 days.last chew was 9:30 am day of surgery  Substance Use Topics  . Alcohol use: Yes    Alcohol/week: 3.0 standard drinks    Types: 3 Cans of beer per week    Comment: 3 BEERS QD or shots of liquor  . Drug use: No    Comment: PT WITH H/O INHALENTS-LAST USED 2010    Family History Family History  Problem Relation Age of Onset  . Cancer Other   . COPD Mother   . Arthritis Maternal Grandmother   . Cancer Maternal Grandmother   . Diabetes Maternal Grandmother     Allergies  Allergen Reactions  . Celebrex [Celecoxib] Hives and Rash  . Sulfa Antibiotics Other (See Comments)    Unknown - childhood allergy     REVIEW OF SYSTEMS (Negative unless  checked)  Constitutional: [] Weight loss  [] Fever  [] Chills Cardiac: [] Chest pain   [] Chest pressure   [] Palpitations   [] Shortness of breath when laying flat   [] Shortness of breath with exertion. Vascular:  [] Pain in legs with walking   [] Pain in legs at rest  [] History of DVT   [] Phlebitis   [] Swelling in legs   [] Varicose veins   [] Non-healing ulcers Pulmonary:   [] Uses home oxygen   [] Productive cough   [] Hemoptysis   [] Wheeze  [] COPD   [] Asthma Neurologic:  [] Dizziness   [] Seizures   [] History of  stroke   [] History of TIA  [] Aphasia   [] Vissual changes   [] Weakness or numbness in arm   [] Weakness or numbness in leg Musculoskeletal:   [] Joint swelling   [] Joint pain   [] Low back pain Hematologic:  [] Easy bruising  [] Easy bleeding   [] Hypercoagulable state   [] Anemic Gastrointestinal:  [] Diarrhea   [] Vomiting  [] Gastroesophageal reflux/heartburn   [] Difficulty swallowing. Genitourinary:  [] Chronic kidney disease   [] Difficult urination  [] Frequent urination   [] Blood in urine Skin:  [] Rashes   [] Ulcers  Psychological:  [] History of anxiety   []  History of major depression.  Physical Examination  Vitals:   09/30/18 1005  BP: (!) 147/92  Pulse: 83  Resp: 19  Weight: 263 lb (119.3 kg)  Height: 5\' 11"  (1.803 m)   Body mass index is 36.68 kg/m. Gen: WD/WN, NAD Head: [place]/AT, No temporalis wasting.  Ear/Nose/Throat: Hearing grossly intact, nares w/o erythema or drainage Eyes: PER, EOMI, sclera nonicteric.  Neck: Supple, no large masses.   Pulmonary:  Good air movement, no audible wheezing bilaterally, no use of accessory muscles.  Cardiac: RRR, no JVD Vascular: scattered varicosities present bilaterally.  Mild venous stasis changes to the legs bilaterally.  2+ soft pitting edema Vessel Right Left  Radial Palpable Palpable  Gastrointestinal: Non-distended. No guarding/no peritoneal signs.  Musculoskeletal: M/S 5/5 throughout.  Severe arthritic deformity no atrophy.  Neurologic: CN  2-12 intact. Symmetrical.  Speech is fluent. Motor exam as listed above. Psychiatric: Judgment intact, Mood & affect appropriate for pt's clinical situation. Dermatologic: Mild venous rashes no ulcers noted.  No changes consistent with cellulitis. Lymph : No lichenification or skin changes of chronic lymphedema.  CBC Lab Results  Component Value Date   WBC 7.6 09/25/2018   HGB 15.3 09/25/2018   HCT 48.8 09/25/2018   MCV 88.2 09/25/2018   PLT 208 09/25/2018    BMET    Component Value Date/Time   NA 141 09/25/2018 0836   NA 146 (H) 02/15/2016 0844   K 4.3 09/25/2018 0836   CL 109 09/25/2018 0836   CO2 25 09/25/2018 0836   GLUCOSE 112 (H) 09/25/2018 0836   BUN 22 09/25/2018 0836   BUN 26 (H) 02/15/2016 0844   CREATININE 0.91 09/25/2018 0836   CALCIUM 8.7 (L) 09/25/2018 0836   GFRNONAA >60 09/25/2018 0836   GFRAA >60 09/25/2018 0836   Estimated Creatinine Clearance: 112 mL/min (by C-G formula based on SCr of 0.91 mg/dL).  COAG Lab Results  Component Value Date   INR 1.05 09/25/2018   INR 0.95 03/23/2018    Radiology No results found.   Assessment/Plan 1. Acute deep vein thrombosis (DVT) of popliteal vein of right lower extremity (HCC) His right TKR is in about one week  I will see him back in 2 months and plan for IVC filter retrieval at that time.  Hortencia Pilar, MD  09/30/2018 10:18 AM

## 2018-10-01 NOTE — Pre-Procedure Instructions (Signed)
SPOKE WITH ELAINE AT DR Clydell Hakim RE DR SCHNIER'S NOTE. SHE WILL DISCUSS WITH DR Marry Guan AND GET PATIENT INSTRUCTED ON WHEN HE CAN STOP ELIQUIS

## 2018-10-06 MED ORDER — CEFAZOLIN SODIUM-DEXTROSE 2-4 GM/100ML-% IV SOLN: 2.0000 g | INTRAVENOUS | Status: DC

## 2018-10-06 MED ORDER — TRANEXAMIC ACID-NACL 1000-0.7 MG/100ML-% IV SOLN
1000.0000 mg | INTRAVENOUS | Status: DC
  Filled 2018-10-06: qty 100

## 2018-10-07 ENCOUNTER — Other Ambulatory Visit: Payer: Self-pay

## 2018-10-07 ENCOUNTER — Encounter
Admission: RE | Disposition: A | Discharge status: Skilled Nursing Facility | Payer: Self-pay | Source: Home / Self Care | Attending: Orthopedic Surgery

## 2018-10-07 ENCOUNTER — Inpatient Hospital Stay: Payer: BLUE CROSS/BLUE SHIELD

## 2018-10-07 ENCOUNTER — Encounter: Payer: Self-pay | Admitting: Orthopedic Surgery

## 2018-10-07 ENCOUNTER — Inpatient Hospital Stay: Payer: BLUE CROSS/BLUE SHIELD | Admitting: Anesthesiology

## 2018-10-07 ENCOUNTER — Inpatient Hospital Stay
Admission: RE | Admit: 2018-10-07 | Discharge: 2018-10-10 | DRG: 470 | Disposition: A | Discharge status: Skilled Nursing Facility | Payer: BLUE CROSS/BLUE SHIELD | Attending: Orthopedic Surgery | Admitting: Orthopedic Surgery

## 2018-10-07 DIAGNOSIS — N400 Enlarged prostate without lower urinary tract symptoms: Secondary | ICD-10-CM | POA: Diagnosis present

## 2018-10-07 DIAGNOSIS — Z882 Allergy status to sulfonamides status: Secondary | ICD-10-CM | POA: Diagnosis not present

## 2018-10-07 DIAGNOSIS — E669 Obesity, unspecified: Secondary | ICD-10-CM | POA: Diagnosis present

## 2018-10-07 DIAGNOSIS — Z7901 Long term (current) use of anticoagulants: Secondary | ICD-10-CM

## 2018-10-07 DIAGNOSIS — I959 Hypotension, unspecified: Secondary | ICD-10-CM | POA: Diagnosis not present

## 2018-10-07 DIAGNOSIS — Z86010 Personal history of colonic polyps: Secondary | ICD-10-CM | POA: Diagnosis not present

## 2018-10-07 DIAGNOSIS — F419 Anxiety disorder, unspecified: Secondary | ICD-10-CM | POA: Diagnosis present

## 2018-10-07 DIAGNOSIS — Z888 Allergy status to other drugs, medicaments and biological substances status: Secondary | ICD-10-CM

## 2018-10-07 DIAGNOSIS — Z86718 Personal history of other venous thrombosis and embolism: Secondary | ICD-10-CM

## 2018-10-07 DIAGNOSIS — Z87891 Personal history of nicotine dependence: Secondary | ICD-10-CM | POA: Diagnosis not present

## 2018-10-07 DIAGNOSIS — Z7982 Long term (current) use of aspirin: Secondary | ICD-10-CM

## 2018-10-07 DIAGNOSIS — K219 Gastro-esophageal reflux disease without esophagitis: Secondary | ICD-10-CM | POA: Diagnosis present

## 2018-10-07 DIAGNOSIS — Z79891 Long term (current) use of opiate analgesic: Secondary | ICD-10-CM | POA: Diagnosis not present

## 2018-10-07 DIAGNOSIS — Z6836 Body mass index (BMI) 36.0-36.9, adult: Secondary | ICD-10-CM

## 2018-10-07 DIAGNOSIS — Z8719 Personal history of other diseases of the digestive system: Secondary | ICD-10-CM | POA: Diagnosis not present

## 2018-10-07 DIAGNOSIS — F329 Major depressive disorder, single episode, unspecified: Secondary | ICD-10-CM | POA: Diagnosis present

## 2018-10-07 DIAGNOSIS — I10 Essential (primary) hypertension: Secondary | ICD-10-CM | POA: Diagnosis present

## 2018-10-07 DIAGNOSIS — Z79899 Other long term (current) drug therapy: Secondary | ICD-10-CM | POA: Diagnosis not present

## 2018-10-07 DIAGNOSIS — M1711 Unilateral primary osteoarthritis, right knee: Secondary | ICD-10-CM | POA: Diagnosis present

## 2018-10-07 DIAGNOSIS — Z96659 Presence of unspecified artificial knee joint: Secondary | ICD-10-CM

## 2018-10-07 HISTORY — PX: KNEE ARTHROPLASTY: SHX992

## 2018-10-07 LAB — GLUCOSE, CAPILLARY: GLUCOSE-CAPILLARY: 149 mg/dL — AB (ref 70–99)

## 2018-10-07 SURGERY — ARTHROPLASTY, KNEE, TOTAL, USING IMAGELESS COMPUTER-ASSISTED NAVIGATION
Anesthesia: Spinal | Site: Knee | Laterality: Right

## 2018-10-07 MED ORDER — ONDANSETRON HCL 4 MG PO TABS: 4.0000 mg | ORAL_TABLET | Freq: Four times a day (QID) | ORAL | Status: DC | PRN

## 2018-10-07 MED ORDER — SENNOSIDES-DOCUSATE SODIUM 8.6-50 MG PO TABS
1.0000 | ORAL_TABLET | Freq: Two times a day (BID) | ORAL | Status: DC
  Administered 2018-10-07 – 2018-10-10 (×6): 1 via ORAL
  Filled 2018-10-07 (×6): qty 1

## 2018-10-07 MED ORDER — PHENOL 1.4 % MT LIQD
1.0000 | OROMUCOSAL | Status: DC | PRN
  Filled 2018-10-07: qty 177

## 2018-10-07 MED ORDER — SODIUM CHLORIDE 0.9 % IV SOLN
INTRAVENOUS | Status: DC | PRN
  Administered 2018-10-07: 30 ug/min via INTRAVENOUS

## 2018-10-07 MED ORDER — HYDROMORPHONE HCL 1 MG/ML IJ SOLN: 0.5000 mg | INTRAMUSCULAR | Status: DC | PRN

## 2018-10-07 MED ORDER — FENTANYL CITRATE (PF) 100 MCG/2ML IJ SOLN
INTRAMUSCULAR | Status: DC | PRN
  Administered 2018-10-07 (×2): 25 ug via INTRAVENOUS
  Administered 2018-10-07: 50 ug via INTRAVENOUS

## 2018-10-07 MED ORDER — PROPOFOL 500 MG/50ML IV EMUL
INTRAVENOUS | Status: AC
  Filled 2018-10-07: qty 50

## 2018-10-07 MED ORDER — CLOMIPHENE CITRATE 50 MG PO TABS
25.0000 mg | ORAL_TABLET | Freq: Every day | ORAL | Status: DC
  Filled 2018-10-07: qty 1

## 2018-10-07 MED ORDER — MIDAZOLAM HCL 2 MG/2ML IJ SOLN
INTRAMUSCULAR | Status: AC
  Administered 2018-10-07: 2 mg via INTRAVENOUS
  Filled 2018-10-07: qty 2

## 2018-10-07 MED ORDER — BISACODYL 10 MG RE SUPP
10.0000 mg | Freq: Every day | RECTAL | Status: DC | PRN
  Administered 2018-10-09: 10 mg via RECTAL
  Filled 2018-10-07: qty 1

## 2018-10-07 MED ORDER — ONDANSETRON HCL 4 MG/2ML IJ SOLN: 4.0000 mg | Freq: Four times a day (QID) | INTRAMUSCULAR | Status: DC | PRN

## 2018-10-07 MED ORDER — BUPIVACAINE HCL (PF) 0.5 % IJ SOLN
INTRAMUSCULAR | Status: AC
  Filled 2018-10-07: qty 10

## 2018-10-07 MED ORDER — ADULT MULTIVITAMIN W/MINERALS CH
1.0000 | ORAL_TABLET | Freq: Every day | ORAL | Status: DC
  Administered 2018-10-08 – 2018-10-10 (×2): 1 via ORAL
  Filled 2018-10-07 (×2): qty 1

## 2018-10-07 MED ORDER — METOPROLOL SUCCINATE ER 25 MG PO TB24
25.0000 mg | ORAL_TABLET | Freq: Two times a day (BID) | ORAL | Status: DC
  Administered 2018-10-07 – 2018-10-10 (×5): 25 mg via ORAL
  Filled 2018-10-07 (×5): qty 1

## 2018-10-07 MED ORDER — FENTANYL CITRATE (PF) 100 MCG/2ML IJ SOLN: 25.0000 ug | INTRAMUSCULAR | Status: DC | PRN

## 2018-10-07 MED ORDER — OXYCODONE HCL 5 MG PO TABS
5.0000 mg | ORAL_TABLET | ORAL | Status: DC | PRN
  Administered 2018-10-07: 5 mg via ORAL
  Filled 2018-10-07: qty 1

## 2018-10-07 MED ORDER — ACETAMINOPHEN 10 MG/ML IV SOLN
INTRAVENOUS | Status: AC
  Filled 2018-10-07: qty 100

## 2018-10-07 MED ORDER — GABAPENTIN 300 MG PO CAPS
ORAL_CAPSULE | ORAL | Status: AC
  Administered 2018-10-07: 300 mg via ORAL
  Filled 2018-10-07: qty 1

## 2018-10-07 MED ORDER — TRANEXAMIC ACID-NACL 1000-0.7 MG/100ML-% IV SOLN
INTRAVENOUS | Status: DC | PRN
  Administered 2018-10-07: 1000 mg via INTRAVENOUS

## 2018-10-07 MED ORDER — ONDANSETRON HCL 4 MG/2ML IJ SOLN: 4.0000 mg | Freq: Once | INTRAMUSCULAR | Status: DC | PRN

## 2018-10-07 MED ORDER — MIDAZOLAM HCL 2 MG/2ML IJ SOLN
2.0000 mg | Freq: Once | INTRAMUSCULAR | Status: AC
  Administered 2018-10-07: 2 mg via INTRAVENOUS

## 2018-10-07 MED ORDER — BUPIVACAINE HCL (PF) 0.5 % IJ SOLN
INTRAMUSCULAR | Status: DC | PRN
  Administered 2018-10-07: 3 mL

## 2018-10-07 MED ORDER — NEOMYCIN-POLYMYXIN B GU 40-200000 IR SOLN
Status: DC | PRN
  Administered 2018-10-07: 14 mL

## 2018-10-07 MED ORDER — METOCLOPRAMIDE HCL 10 MG PO TABS: 5.0000 mg | ORAL_TABLET | Freq: Three times a day (TID) | ORAL | Status: DC | PRN

## 2018-10-07 MED ORDER — MIDAZOLAM HCL 2 MG/2ML IJ SOLN
INTRAMUSCULAR | Status: AC
  Filled 2018-10-07: qty 2

## 2018-10-07 MED ORDER — TAMSULOSIN HCL 0.4 MG PO CAPS
0.4000 mg | ORAL_CAPSULE | Freq: Every day | ORAL | Status: DC
  Administered 2018-10-07 – 2018-10-09 (×3): 0.4 mg via ORAL
  Filled 2018-10-07 (×3): qty 1

## 2018-10-07 MED ORDER — FLEET ENEMA 7-19 GM/118ML RE ENEM: 1.0000 | ENEMA | Freq: Once | RECTAL | Status: DC | PRN

## 2018-10-07 MED ORDER — SODIUM CHLORIDE 0.9 % IV SOLN
INTRAVENOUS | Status: DC
  Administered 2018-10-07 – 2018-10-08 (×2): via INTRAVENOUS

## 2018-10-07 MED ORDER — TRAMADOL HCL 50 MG PO TABS
50.0000 mg | ORAL_TABLET | ORAL | Status: DC | PRN
  Administered 2018-10-07 – 2018-10-10 (×2): 100 mg via ORAL
  Filled 2018-10-07 (×2): qty 2

## 2018-10-07 MED ORDER — DIPHENHYDRAMINE HCL 12.5 MG/5ML PO ELIX: 12.5000 mg | ORAL_SOLUTION | ORAL | Status: DC | PRN

## 2018-10-07 MED ORDER — LIDOCAINE HCL (PF) 2 % IJ SOLN
INTRAMUSCULAR | Status: AC
  Filled 2018-10-07: qty 10

## 2018-10-07 MED ORDER — FENTANYL CITRATE (PF) 100 MCG/2ML IJ SOLN
INTRAMUSCULAR | Status: AC
  Filled 2018-10-07: qty 2

## 2018-10-07 MED ORDER — METOCLOPRAMIDE HCL 5 MG/ML IJ SOLN: 5.0000 mg | Freq: Three times a day (TID) | INTRAMUSCULAR | Status: DC | PRN

## 2018-10-07 MED ORDER — BUPIVACAINE HCL (PF) 0.25 % IJ SOLN
INTRAMUSCULAR | Status: DC | PRN
  Administered 2018-10-07: 60 mL

## 2018-10-07 MED ORDER — CEFAZOLIN SODIUM-DEXTROSE 2-4 GM/100ML-% IV SOLN
2.0000 g | Freq: Four times a day (QID) | INTRAVENOUS | Status: AC
  Administered 2018-10-07 – 2018-10-08 (×3): 2 g via INTRAVENOUS
  Filled 2018-10-07 (×4): qty 100

## 2018-10-07 MED ORDER — TETRACAINE HCL 1 % IJ SOLN
INTRAMUSCULAR | Status: DC | PRN
  Administered 2018-10-07: 5 mg via INTRASPINAL

## 2018-10-07 MED ORDER — SODIUM CHLORIDE 0.9 % IV SOLN
INTRAVENOUS | Status: DC | PRN
  Administered 2018-10-07: 60 mL

## 2018-10-07 MED ORDER — APIXABAN 5 MG PO TABS
5.0000 mg | ORAL_TABLET | Freq: Two times a day (BID) | ORAL | Status: DC
  Administered 2018-10-08 – 2018-10-10 (×5): 5 mg via ORAL
  Filled 2018-10-07 (×5): qty 1

## 2018-10-07 MED ORDER — FAMOTIDINE 20 MG PO TABS
ORAL_TABLET | ORAL | Status: AC
  Administered 2018-10-07: 20 mg via ORAL
  Filled 2018-10-07: qty 1

## 2018-10-07 MED ORDER — ACETAMINOPHEN 325 MG PO TABS: 325.0000 mg | ORAL_TABLET | Freq: Four times a day (QID) | ORAL | Status: DC | PRN

## 2018-10-07 MED ORDER — TEMAZEPAM 15 MG PO CAPS
30.0000 mg | ORAL_CAPSULE | Freq: Every day | ORAL | Status: DC
  Administered 2018-10-07 – 2018-10-10 (×3): 30 mg via ORAL
  Filled 2018-10-07 (×3): qty 2

## 2018-10-07 MED ORDER — CHLORHEXIDINE GLUCONATE 4 % EX LIQD: 60.0000 mL | Freq: Once | CUTANEOUS | Status: DC

## 2018-10-07 MED ORDER — ACETAMINOPHEN 10 MG/ML IV SOLN
INTRAVENOUS | Status: DC | PRN
  Administered 2018-10-07: 1000 mg via INTRAVENOUS

## 2018-10-07 MED ORDER — PHENYLEPHRINE HCL 10 MG/ML IJ SOLN
INTRAMUSCULAR | Status: DC | PRN
  Administered 2018-10-07 (×4): 100 ug via INTRAVENOUS

## 2018-10-07 MED ORDER — CEFAZOLIN SODIUM-DEXTROSE 2-4 GM/100ML-% IV SOLN
INTRAVENOUS | Status: AC
  Filled 2018-10-07: qty 100

## 2018-10-07 MED ORDER — PROPOFOL 500 MG/50ML IV EMUL
INTRAVENOUS | Status: DC | PRN
  Administered 2018-10-07: 100 ug/kg/min via INTRAVENOUS

## 2018-10-07 MED ORDER — ACETAMINOPHEN 10 MG/ML IV SOLN
1000.0000 mg | Freq: Four times a day (QID) | INTRAVENOUS | Status: AC
  Administered 2018-10-07 – 2018-10-08 (×3): 1000 mg via INTRAVENOUS
  Filled 2018-10-07 (×4): qty 100

## 2018-10-07 MED ORDER — PANTOPRAZOLE SODIUM 40 MG PO TBEC
40.0000 mg | DELAYED_RELEASE_TABLET | Freq: Two times a day (BID) | ORAL | Status: DC
  Administered 2018-10-07 – 2018-10-10 (×6): 40 mg via ORAL
  Filled 2018-10-07 (×6): qty 1

## 2018-10-07 MED ORDER — CEFAZOLIN SODIUM-DEXTROSE 2-3 GM-%(50ML) IV SOLR
INTRAVENOUS | Status: DC | PRN
  Administered 2018-10-07 (×2): 2 g via INTRAVENOUS

## 2018-10-07 MED ORDER — LACTATED RINGERS IV SOLN
INTRAVENOUS | Status: DC
  Administered 2018-10-07 (×3): via INTRAVENOUS

## 2018-10-07 MED ORDER — FINASTERIDE 5 MG PO TABS
5.0000 mg | ORAL_TABLET | Freq: Every day | ORAL | Status: DC
  Administered 2018-10-07 – 2018-10-09 (×3): 5 mg via ORAL
  Filled 2018-10-07 (×3): qty 1

## 2018-10-07 MED ORDER — METOCLOPRAMIDE HCL 10 MG PO TABS
10.0000 mg | ORAL_TABLET | Freq: Three times a day (TID) | ORAL | Status: AC
  Administered 2018-10-07 – 2018-10-09 (×8): 10 mg via ORAL
  Filled 2018-10-07 (×8): qty 1

## 2018-10-07 MED ORDER — FAMOTIDINE 20 MG PO TABS
20.0000 mg | ORAL_TABLET | Freq: Once | ORAL | Status: AC
  Administered 2018-10-07: 20 mg via ORAL

## 2018-10-07 MED ORDER — ALUM & MAG HYDROXIDE-SIMETH 200-200-20 MG/5ML PO SUSP: 30.0000 mL | ORAL | Status: DC | PRN

## 2018-10-07 MED ORDER — DEXAMETHASONE SODIUM PHOSPHATE 10 MG/ML IJ SOLN
8.0000 mg | Freq: Once | INTRAMUSCULAR | Status: AC
  Administered 2018-10-07: 8 mg via INTRAVENOUS

## 2018-10-07 MED ORDER — TRANEXAMIC ACID-NACL 1000-0.7 MG/100ML-% IV SOLN
1000.0000 mg | Freq: Once | INTRAVENOUS | Status: AC
  Administered 2018-10-07: 1000 mg via INTRAVENOUS
  Filled 2018-10-07: qty 100

## 2018-10-07 MED ORDER — KETAMINE HCL 50 MG/ML IJ SOLN
INTRAMUSCULAR | Status: AC
  Filled 2018-10-07: qty 10

## 2018-10-07 MED ORDER — DEXAMETHASONE SODIUM PHOSPHATE 10 MG/ML IJ SOLN
INTRAMUSCULAR | Status: AC
  Administered 2018-10-07: 8 mg via INTRAVENOUS
  Filled 2018-10-07: qty 1

## 2018-10-07 MED ORDER — GABAPENTIN 300 MG PO CAPS
300.0000 mg | ORAL_CAPSULE | Freq: Every day | ORAL | Status: DC
  Administered 2018-10-07 – 2018-10-09 (×3): 300 mg via ORAL
  Filled 2018-10-07 (×3): qty 1

## 2018-10-07 MED ORDER — GABAPENTIN 300 MG PO CAPS
300.0000 mg | ORAL_CAPSULE | Freq: Once | ORAL | Status: AC
  Administered 2018-10-07: 300 mg via ORAL

## 2018-10-07 MED ORDER — TESTOSTERONE 4 MG/24HR TD PT24
1.0000 | MEDICATED_PATCH | TRANSDERMAL | Status: DC
  Filled 2018-10-07: qty 1

## 2018-10-07 MED ORDER — OXYCODONE HCL 5 MG PO TABS
10.0000 mg | ORAL_TABLET | ORAL | Status: DC | PRN
  Administered 2018-10-08 – 2018-10-10 (×14): 10 mg via ORAL
  Filled 2018-10-07 (×14): qty 2

## 2018-10-07 MED ORDER — MIDAZOLAM HCL 5 MG/5ML IJ SOLN
INTRAMUSCULAR | Status: DC | PRN
  Administered 2018-10-07 (×4): 1 mg via INTRAVENOUS

## 2018-10-07 MED ORDER — PROPOFOL 10 MG/ML IV BOLUS
INTRAVENOUS | Status: DC | PRN
  Administered 2018-10-07: 40 mg via INTRAVENOUS

## 2018-10-07 MED ORDER — FERROUS SULFATE 325 (65 FE) MG PO TABS
325.0000 mg | ORAL_TABLET | Freq: Two times a day (BID) | ORAL | Status: DC
  Administered 2018-10-08 – 2018-10-10 (×5): 325 mg via ORAL
  Filled 2018-10-07 (×5): qty 1

## 2018-10-07 MED ORDER — MENTHOL 3 MG MT LOZG
1.0000 | LOZENGE | OROMUCOSAL | Status: DC | PRN
  Filled 2018-10-07: qty 9

## 2018-10-07 MED ORDER — MAGNESIUM HYDROXIDE 400 MG/5ML PO SUSP
30.0000 mL | Freq: Every day | ORAL | Status: DC
  Administered 2018-10-08 – 2018-10-09 (×2): 30 mL via ORAL
  Filled 2018-10-07 (×2): qty 30

## 2018-10-07 SURGICAL SUPPLY — 79 items
ATTUNE MED DOME PAT 41 KNEE (Knees) ×1 IMPLANT
ATTUNE MED DOME PAT 41MM KNEE (Knees) ×1 IMPLANT
ATTUNE PS FEM RT SZ 8 CEM KNEE (Femur) ×2 IMPLANT
ATTUNE PSRP INSR SZ8 5 KNEE (Insert) ×1 IMPLANT
ATTUNE PSRP INSR SZ8 5MM KNEE (Insert) ×1 IMPLANT
BASE TIBIAL ATTUNE KNEE SZ9 (Knees) IMPLANT
BATTERY INSTRU NAVIGATION (MISCELLANEOUS) ×12 IMPLANT
BLADE SAW 70X12.5 (BLADE) ×3 IMPLANT
BLADE SAW 90X13X1.19 OSCILLAT (BLADE) ×3 IMPLANT
BLADE SAW 90X25X1.19 OSCILLAT (BLADE) ×3 IMPLANT
BONE CEMENT GENTAMICIN (Cement) ×6 IMPLANT
BSPLAT TIB 9 CMNT ROT PLAT STR (Knees) ×1 IMPLANT
BTRY SRG DRVR LF (MISCELLANEOUS) ×4
CANISTER SUCT 1200ML W/VALVE (MISCELLANEOUS) ×3 IMPLANT
CANISTER SUCT 3000ML PPV (MISCELLANEOUS) ×6 IMPLANT
CEMENT BONE GENTAMICIN 40 (Cement) IMPLANT
COOLER POLAR GLACIER W/PUMP (MISCELLANEOUS) ×3 IMPLANT
COVER WAND RF STERILE (DRAPES) ×3 IMPLANT
CUFF TOURN 24 STER (MISCELLANEOUS) IMPLANT
CUFF TOURN 30 STER DUAL PORT (MISCELLANEOUS) ×2 IMPLANT
DRAPE SHEET LG 3/4 BI-LAMINATE (DRAPES) ×3 IMPLANT
DRSG DERMACEA 8X12 NADH (GAUZE/BANDAGES/DRESSINGS) ×3 IMPLANT
DRSG OPSITE POSTOP 4X14 (GAUZE/BANDAGES/DRESSINGS) ×3 IMPLANT
DRSG TEGADERM 4X4.75 (GAUZE/BANDAGES/DRESSINGS) ×3 IMPLANT
DURAPREP 26ML APPLICATOR (WOUND CARE) ×6 IMPLANT
ELECT CAUTERY BLADE 6.4 (BLADE) ×3 IMPLANT
ELECT REM PT RETURN 9FT ADLT (ELECTROSURGICAL) ×3
ELECTRODE REM PT RTRN 9FT ADLT (ELECTROSURGICAL) ×1 IMPLANT
EX-PIN ORTHOLOCK NAV 4X150 (PIN) ×6 IMPLANT
GLOVE BIOGEL M STRL SZ7.5 (GLOVE) ×6 IMPLANT
GLOVE BIOGEL PI IND STRL 7.5 (GLOVE) IMPLANT
GLOVE BIOGEL PI IND STRL 9 (GLOVE) ×1 IMPLANT
GLOVE BIOGEL PI INDICATOR 7.5 (GLOVE) ×12
GLOVE BIOGEL PI INDICATOR 9 (GLOVE) ×2
GLOVE INDICATOR 8.0 STRL GRN (GLOVE) ×3 IMPLANT
GLOVE SURG SYN 9.0  PF PI (GLOVE) ×2
GLOVE SURG SYN 9.0 PF PI (GLOVE) ×1 IMPLANT
GOWN STRL REUS W/ TWL LRG LVL3 (GOWN DISPOSABLE) ×2 IMPLANT
GOWN STRL REUS W/TWL 2XL LVL3 (GOWN DISPOSABLE) ×3 IMPLANT
GOWN STRL REUS W/TWL LRG LVL3 (GOWN DISPOSABLE) ×6
HEMOVAC 400CC 10FR (MISCELLANEOUS) ×3 IMPLANT
HOLDER FOLEY CATH W/STRAP (MISCELLANEOUS) ×3 IMPLANT
HOOD PEEL AWAY FLYTE STAYCOOL (MISCELLANEOUS) ×8 IMPLANT
KIT TURNOVER KIT A (KITS) ×3 IMPLANT
KNIFE SCULPS 14X20 (INSTRUMENTS) ×3 IMPLANT
LABEL OR SOLS (LABEL) ×3 IMPLANT
NDL SAFETY ECLIPSE 18X1.5 (NEEDLE) ×1 IMPLANT
NDL SPNL 20GX3.5 QUINCKE YW (NEEDLE) ×2 IMPLANT
NEEDLE HYPO 18GX1.5 SHARP (NEEDLE) ×3
NEEDLE SPNL 20GX3.5 QUINCKE YW (NEEDLE) ×6 IMPLANT
NS IRRIG 500ML POUR BTL (IV SOLUTION) ×3 IMPLANT
PACK TOTAL KNEE (MISCELLANEOUS) ×3 IMPLANT
PAD WRAPON POLAR KNEE (MISCELLANEOUS) ×1 IMPLANT
PENCIL SMOKE ULTRAEVAC 22 CON (MISCELLANEOUS) ×3 IMPLANT
PIN DRILL QUICK PACK ×3 IMPLANT
PIN FIXATION 1/8DIA X 3INL (PIN) ×11 IMPLANT
PULSAVAC PLUS IRRIG FAN TIP (DISPOSABLE) ×3
SOL .9 NS 3000ML IRR  AL (IV SOLUTION) ×2
SOL .9 NS 3000ML IRR AL (IV SOLUTION) ×1
SOL .9 NS 3000ML IRR UROMATIC (IV SOLUTION) ×1 IMPLANT
SOL PREP PVP 2OZ (MISCELLANEOUS) ×3
SOLUTION PREP PVP 2OZ (MISCELLANEOUS) ×1 IMPLANT
SPONGE DRAIN TRACH 4X4 STRL 2S (GAUZE/BANDAGES/DRESSINGS) ×3 IMPLANT
STAPLER SKIN PROX 35W (STAPLE) ×3 IMPLANT
STOCKINETTE IMPERV 14X48 (MISCELLANEOUS) ×2 IMPLANT
STRAP TIBIA SHORT (MISCELLANEOUS) ×3 IMPLANT
SUCTION FRAZIER HANDLE 10FR (MISCELLANEOUS) ×2
SUCTION TUBE FRAZIER 10FR DISP (MISCELLANEOUS) ×1 IMPLANT
SUT VIC AB 0 CT1 36 (SUTURE) ×5 IMPLANT
SUT VIC AB 1 CT1 36 (SUTURE) ×6 IMPLANT
SUT VIC AB 2-0 CT2 27 (SUTURE) ×3 IMPLANT
SYR 20CC LL (SYRINGE) ×3 IMPLANT
SYR 30ML LL (SYRINGE) ×6 IMPLANT
TIBIAL BASE ATTUNE KNEE SZ9 (Knees) ×3 IMPLANT
TIP FAN IRRIG PULSAVAC PLUS (DISPOSABLE) ×1 IMPLANT
TOWEL OR 17X26 4PK STRL BLUE (TOWEL DISPOSABLE) ×3 IMPLANT
TOWER CARTRIDGE SMART MIX (DISPOSABLE) ×3 IMPLANT
TRAY FOLEY MTR SLVR 16FR STAT (SET/KITS/TRAYS/PACK) ×3 IMPLANT
WRAPON POLAR PAD KNEE (MISCELLANEOUS) ×3

## 2018-10-07 NOTE — Anesthesia Preprocedure Evaluation (Signed)
Anesthesia Evaluation  Patient identified by MRN, date of birth, ID band Patient awake    Reviewed: Allergy & Precautions, H&P , NPO status , Patient's Chart, lab work & pertinent test results, reviewed documented beta blocker date and time   Airway Mallampati: III   Neck ROM: full    Dental  (+) Poor Dentition, Teeth Intact   Pulmonary neg pulmonary ROS, former smoker,    Pulmonary exam normal        Cardiovascular Exercise Tolerance: Poor hypertension, On Medications + DVT  Normal cardiovascular exam Rhythm:regular Rate:Normal     Neuro/Psych PSYCHIATRIC DISORDERS Anxiety Depression  Neuromuscular disease    GI/Hepatic Neg liver ROS, GERD  Medicated,  Endo/Other  negative endocrine ROS  Renal/GU negative Renal ROS  negative genitourinary   Musculoskeletal   Abdominal   Peds  Hematology negative hematology ROS (+)   Anesthesia Other Findings Past Medical History: No date: Arthritis     Comment:  right shoulder; right knee 02/28/2016: Depression 01/2018: DVT (deep venous thrombosis) ([place])     Comment:  RIGHT POPLITEAL No date: Enlarged prostate No date: Foreign body, eye     Comment:  left eyelid 10/24/2017: Gastritis No date: GERD (gastroesophageal reflux disease)     Comment:  nissan plication and this has improved No date: Hemorrhoid 05/24/2015: History of colon polyps No date: Hypertension     Comment:  controlled on med No date: Neck pain     Comment:  "EXPERIENCE CRICK IN NECK". comes and goes 2019 No date: Neuromuscular disorder ([place])     Comment:  hands are tingly and go numb. possibly carpal tunnel.                right hand No date: Pre-diabetes     Comment:  A1C in 2019 was 6.0 No date: Substance abuse ([place])     Comment:  hx of inhalants, last use 2010.  regular  daily alcohol               intake (2019) 65/68/1275: Umbilical hernia without obstruction and without gangrene Past Surgical  History: 1999: ABDOMINAL SURGERY     Comment:  nissen fundoplication 1700: BUNIONECTOMY; Left 03/12/2018: CARPAL TUNNEL RELEASE; Right     Comment:  Procedure: CARPAL TUNNEL RELEASE;  Surgeon: Hessie Knows, MD;  Location: [facility];  Service: Orthopedics;               Laterality: Right; 10/18/2015: COLONOSCOPY WITH PROPOFOL; N/A     Comment:  Procedure: COLONOSCOPY WITH PROPOFOL;  Surgeon: Lucilla Lame, MD;  Location: [place];  Service:               Endoscopy;  Laterality: N/A; 10/18/2015: ESOPHAGOGASTRODUODENOSCOPY (EGD) WITH PROPOFOL; N/A     Comment:  Procedure: ESOPHAGOGASTRODUODENOSCOPY (EGD) WITH               PROPOFOL;  Surgeon: Lucilla Lame, MD;  Location: [facility];  Service: Endoscopy;  Laterality: N/A;                wants as early as possible No date: HERNIA REPAIR 09/24/2018: IVC FILTER INSERTION; N/A     Comment:  Procedure: IVC FILTER INSERTION;  Surgeon: Delana Meyer,  Dolores Lory, MD;  Location: [facility];  Service:              Cardiovascular;  Laterality: N/A; 02/18/2018: KNEE ARTHROSCOPY; Right     Comment:  Procedure: ARTHROSCOPY KNEE;  Surgeon: Dereck Leep,               MD;  Location: [facility];  Service: Orthopedics;                Laterality: Right; 02/18/2018: KNEE ARTHROSCOPY WITH LATERAL MENISECTOMY; Right     Comment:  Procedure: KNEE ARTHROSCOPY WITH LATERAL MENISECTOMY;                Surgeon: Dereck Leep, MD;  Location: [facility];                Service: Orthopedics;  Laterality: Right; 02/18/2018: KNEE ARTHROSCOPY WITH MEDIAL MENISECTOMY; Right     Comment:  Procedure: KNEE ARTHROSCOPY WITH MEDIAL MENISECTOMY;                Surgeon: Dereck Leep, MD;  Location: [facility];                Service: Orthopedics;  Laterality: Right; 8115: NISSEN FUNDOPLICATION 72/6203: SHOULDER SURGERY     Comment:  Right shoulder replacement 55/97/4163: UMBILICAL HERNIA REPAIR; N/A      Comment:  Procedure: HERNIA REPAIR UMBILICAL ADULT;  Surgeon:               Robert Bellow, MD;  Location: [facility];  Service:               General;  Laterality: N/A; 1990: WRIST SURGERY; Left     Comment:  bones fused. metal in wrist   Reproductive/Obstetrics negative OB ROS                             Anesthesia Physical Anesthesia Plan  ASA: III  Anesthesia Plan: General and Spinal   Post-op Pain Management:    Induction:   PONV Risk Score and Plan:   Airway Management Planned:   Additional Equipment:   Intra-op Plan:   Post-operative Plan:   Informed Consent: I have reviewed the patients History and Physical, chart, labs and discussed the procedure including the risks, benefits and alternatives for the proposed anesthesia with the patient or authorized representative who has indicated his/her understanding and acceptance.   Dental Advisory Given  Plan Discussed with: CRNA  Anesthesia Plan Comments:        Anesthesia Quick Evaluation

## 2018-10-07 NOTE — Anesthesia Post-op Follow-up Note (Signed)
Anesthesia QCDR form completed.        

## 2018-10-07 NOTE — H&P (Signed)
The patient has been re-examined, and the chart reviewed, and there have been no interval changes to the documented history and physical.    The risks, benefits, and alternatives have been discussed at length. The patient expressed understanding of the risks benefits and agreed with plans for surgical intervention.  James P. Hooten, Jr. M.D.    

## 2018-10-07 NOTE — Anesthesia Procedure Notes (Signed)
Spinal  Patient location during procedure: OR Preanesthetic Checklist Completed: patient identified, site marked, surgical consent, pre-op evaluation, timeout performed, IV checked, risks and benefits discussed and monitors and equipment checked Spinal Block Patient position: sitting Prep: Betadine Patient monitoring: heart rate, continuous pulse ox, blood pressure and cardiac monitor Approach: midline Location: L4-5 Injection technique: single-shot Needle Needle type: Whitacre and Introducer  Needle gauge: 25 G Needle length: 15 cm Assessment Sensory level: T4 Additional Notes Negative paresthesia. Negative blood return. Positive free-flowing CSF. Expiration date of kit checked and confirmed. Patient tolerated procedure well, without complications.

## 2018-10-07 NOTE — Transfer of Care (Signed)
Immediate Anesthesia Transfer of Care Note  Patient: Louis Salazar  Procedure(s) Performed: COMPUTER ASSISTED TOTAL KNEE ARTHROPLASTY (Right Knee)  Patient Location: PACU  Anesthesia Type:Spinal  Level of Consciousness: sedated  Airway & Oxygen Therapy: Patient Spontanous Breathing and Patient connected to face mask oxygen  Post-op Assessment: Report given to RN and Post -op Vital signs reviewed and stable  Post vital signs: Reviewed and stable  Last Vitals:  Vitals Value Taken Time  BP 112/78 10/07/2018  6:45 PM  Temp    Pulse 86 10/07/2018  6:46 PM  Resp 20 10/07/2018  6:46 PM  SpO2 97 % 10/07/2018  6:46 PM  Vitals shown include unvalidated device data.  Last Pain:  Vitals:   10/07/18 1359  TempSrc: Temporal  PainSc: 0-No pain         Complications: No apparent anesthesia complications

## 2018-10-07 NOTE — Op Note (Signed)
OPERATIVE NOTE  DATE OF SURGERY:  10/07/2018  PATIENT NAME:  Louis Salazar   DOB: 1956/06/26  MRN: [id number]  PRE-OPERATIVE DIAGNOSIS: Degenerative arthrosis of the right knee, primary  POST-OPERATIVE DIAGNOSIS:  Same  PROCEDURE:  Right total knee arthroplasty using computer-assisted navigation  SURGEON:  Marciano Sequin. M.D.  ASSISTANT:  Vance Peper, PA (present and scrubbed throughout the case, critical for assistance with exposure, retraction, instrumentation, and closure)  ANESTHESIA: spinal  ESTIMATED BLOOD LOSS: 50 mL  FLUIDS REPLACED: 2000 mL of crystalloid  TOURNIQUET TIME: 90 minutes  DRAINS: 2 medium Hemovac drains  SOFT TISSUE RELEASES: Anterior cruciate ligament, posterior cruciate ligament, deep and superficial medial collateral ligament, patellofemoral ligament  IMPLANTS UTILIZED: DePuy Attune size 8 posterior stabilized femoral component (cemented), size 9 rotating platform tibial component (cemented), 41 mm medialized dome patella (cemented), and a 5 mm stabilized rotating platform polyethylene insert.  INDICATIONS FOR SURGERY: Louis Salazar is a 62 y.o. year old male with a long history of progressive knee pain. X-rays demonstrated severe degenerative changes in tricompartmental fashion. The patient had not seen any significant improvement despite conservative nonsurgical intervention. After discussion of the risks and benefits of surgical intervention, the patient expressed understanding of the risks benefits and agree with plans for total knee arthroplasty.   The risks, benefits, and alternatives were discussed at length including but not limited to the risks of infection, bleeding, nerve injury, stiffness, blood clots, the need for revision surgery, cardiopulmonary complications, among others, and they were willing to proceed.  PROCEDURE IN DETAIL: The patient was brought into the operating room and, after adequate spinal anesthesia was achieved, a  tourniquet was placed on the patient's upper thigh. The patient's knee and leg were cleaned and prepped with alcohol and DuraPrep and draped in the usual sterile fashion. A "timeout" was performed as per usual protocol. The lower extremity was exsanguinated using an Esmarch, and the tourniquet was inflated to 300 mmHg. An anterior longitudinal incision was made followed by a standard mid vastus approach. The deep fibers of the medial collateral ligament were elevated in a subperiosteal fashion off of the medial flare of the tibia so as to maintain a continuous soft tissue sleeve. The patella was subluxed laterally and the patellofemoral ligament was incised. Inspection of the knee demonstrated severe degenerative changes with full-thickness loss of articular cartilage. Osteophytes were debrided using a rongeur. Anterior and posterior cruciate ligaments were excised. Two 4.0 mm Schanz pins were inserted in the femur and into the tibia for attachment of the array of trackers used for computer-assisted navigation. Hip center was identified using a circumduction technique. Distal landmarks were mapped using the computer. The distal femur and proximal tibia were mapped using the computer. The distal femoral cutting guide was positioned using computer-assisted navigation so as to achieve a 5 distal valgus cut. The femur was sized and it was felt that a size 8 femoral component was appropriate. A size 8 femoral cutting guide was positioned and the anterior cut was performed and verified using the computer. This was followed by completion of the posterior and chamfer cuts. Femoral cutting guide for the central box was then positioned in the center box cut was performed.  Attention was then directed to the proximal tibia. Medial and lateral menisci were excised. The extramedullary tibial cutting guide was positioned using computer-assisted navigation so as to achieve a 0 varus-valgus alignment and 3 posterior slope. The  cut was performed and verified using the computer. The  proximal tibia was sized and it was felt that a size 9 tibial tray was appropriate. Tibial and femoral trials were inserted followed by insertion of a 5 mm polyethylene insert. The knee was felt to be tight medially. A Cobb elevator was used to elevate the superficial fibers of the medial collateral ligament.  This allowed for excellent mediolateral soft tissue balancing both in flexion and in full extension. Finally, the patella was cut and prepared so as to accommodate a 41 mm medialized dome patella. A patella trial was placed and the knee was placed through a range of motion with excellent patellar tracking appreciated. The femoral trial was removed after debridement of posterior osteophytes. The central post-hole for the tibial component was reamed followed by insertion of a keel punch. Tibial trials were then removed. Cut surfaces of bone were irrigated with copious amounts of normal saline with antibiotic solution using pulsatile lavage and then suctioned dry. Polymethylmethacrylate cement with gentamicin was prepared in the usual fashion using a vacuum mixer. Cement was applied to the cut surface of the proximal tibia as well as along the undersurface of a size 9 rotating platform tibial component. Tibial component was positioned and impacted into place. Excess cement was removed using [employer] fast streamer. Cement was then applied to the cut surfaces of the femur as well as along the posterior flanges of the size 8 femoral component. The femoral component was positioned and impacted into place. Excess cement was removed using [employer] fast streamer. A 5 mm polyethylene trial was inserted and the knee was brought into full extension with steady axial compression applied. Finally, cement was applied to the backside of a 41 mm medialized dome patella and the patellar component was positioned and patellar clamp applied. Excess cement was removed using [employer] fast streamer.  After adequate curing of the cement, the tourniquet was deflated after a total tourniquet time of 90 minutes. Hemostasis was achieved using electrocautery. The knee was irrigated with copious amounts of normal saline with antibiotic solution using pulsatile lavage and then suctioned dry. 20 mL of 1.3% Exparel and 60 mL of 0.25% Marcaine in 40 mL of normal saline was injected along the posterior capsule, medial and lateral gutters, and along the arthrotomy site. A 5 mm stabilized rotating platform polyethylene insert was inserted and the knee was placed through a range of motion with excellent mediolateral soft tissue balancing appreciated and excellent patellar tracking noted. 2 medium drains were placed in the wound bed and brought out through separate stab incisions. The medial parapatellar portion of the incision was reapproximated using interrupted sutures of #1 Vicryl. Subcutaneous tissue was approximated in layers using first #0 Vicryl followed #2-0 Vicryl. The skin was approximated with skin staples. A sterile dressing was applied.  The patient tolerated the procedure well and was transported to the recovery room in stable condition.    James P. Holley Bouche., M.D.

## 2018-10-08 ENCOUNTER — Other Ambulatory Visit: Payer: Self-pay

## 2018-10-08 ENCOUNTER — Encounter: Payer: Self-pay | Admitting: Orthopedic Surgery

## 2018-10-08 LAB — GLUCOSE, CAPILLARY: GLUCOSE-CAPILLARY: 129 mg/dL — AB (ref 70–99)

## 2018-10-08 MED ORDER — TRAMADOL HCL 50 MG PO TABS: 50.0000 mg | ORAL_TABLET | ORAL | 0 refills | Status: DC | PRN

## 2018-10-08 MED ORDER — OXYCODONE HCL 5 MG PO TABS: 5.0000 mg | ORAL_TABLET | ORAL | 0 refills | Status: DC | PRN

## 2018-10-08 NOTE — Clinical Social Work Placement (Signed)
   CLINICAL SOCIAL WORK PLACEMENT  NOTE  Date:  10/08/2018  Patient Details  Name: Louis Salazar MRN: [id number] Date of Birth: 1956-06-23  Clinical Social Work is seeking post-discharge placement for this patient at the [place] level of care (*CSW will initial, date and re-position this form in  chart as items are completed):  Yes   Patient/family provided with [facility] Work Department's list of facilities offering this level of care within the geographic area requested by the patient (or if unable, by the patient's family).  Yes   Patient/family informed of their freedom to choose among providers that offer the needed level of care, that participate in Medicare, Medicaid or managed care program needed by the patient, have an available bed and are willing to accept the patient.  Yes   Patient/family informed of [facility]'s ownership interest in [facility] and [facility], as well as of the fact that they are under no obligation to receive care at these facilities.  PASRR submitted to EDS on 10/08/18     PASRR number received on 10/08/18     Existing PASRR number confirmed on       FL2 transmitted to all facilities in geographic area requested by pt/family on 10/08/18     FL2 transmitted to all facilities within larger geographic area on       Patient informed that his/her managed care company has contracts with or will negotiate with certain facilities, including the following:        Yes   Patient/family informed of bed offers received.  Patient chooses bed at ([facility] )     Physician recommends and patient chooses bed at      Patient to be transferred to   on  .  Patient to be transferred to facility by       Patient family notified on   of transfer.  Name of family member notified:        PHYSICIAN       Additional Comment:    _______________________________________________ Kenzee Bassin, Veronia Beets, LCSW 10/08/2018, 3:20  PM

## 2018-10-08 NOTE — NC FL2 (Signed)
[place] LEVEL OF CARE SCREENING TOOL     IDENTIFICATION  Patient Name: Louis Salazar Birthdate: 22-Dec-1955 Sex: male Admission Date (Current Location): 10/07/2018  [place] and [place] Number:  Engineering geologist and Address:  [facility], [address] [place], [place] [address]      Provider Number: [id number]  Attending Physician Name and Address:  Dereck Leep, MD  Relative Name and Phone Number:       Current Level of Care: Hospital Recommended Level of Care: [place] Prior Approval Number:    Date Approved/Denied:   PASRR Number: [id number])  Discharge Plan: SNF    Current Diagnoses: Patient Active Problem List   Diagnosis Date Noted  . Total knee replacement status 10/07/2018  . DJD (degenerative joint disease), multiple sites 08/16/2018  . Primary osteoarthritis of right knee 07/14/2018  . DVT (deep venous thrombosis) ([place]) 04/09/2018  . Umbilical hernia without obstruction and without gangrene 08/24/2016  . Obesity 02/28/2016  . Depression 02/28/2016  . Parotiditis 02/14/2016  . Left leg swelling 02/14/2016  . Excessive drinking alcohol 02/14/2016  . Tachycardia 02/14/2016  . Blood in stool   . Other specified diseases of esophagus   . Gastritis   . Benign neoplasm of cecum   . Cellulitis and abscess of oral soft tissues   . Benign neoplasm of sigmoid colon   . 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 Essential (primary) hypertension 05/24/2015  . Brash 05/24/2015  . History of colon polyps 05/24/2015  . Arthralgia of shoulder 05/24/2015  . Insomnia 05/24/2015  . Primary osteoarthritis of both shoulders 04/27/2015  . Carpal tunnel syndrome 04/27/2015    Orientation RESPIRATION BLADDER Height & Weight     Self, Time, Situation, Place  Normal Continent Weight:   Height:     BEHAVIORAL SYMPTOMS/MOOD NEUROLOGICAL BOWEL NUTRITION STATUS      Continent Diet(Diet: Heart Healthy/ Low  Sodium. )  AMBULATORY STATUS COMMUNICATION OF NEEDS Skin   Extensive Assist Verbally Surgical wounds(Incision: Right Knee. )                       Personal Care Assistance Level of Assistance  Bathing, Feeding, Dressing Bathing Assistance: Limited assistance Feeding assistance: Independent Dressing Assistance: Limited assistance     Functional Limitations Info  Sight, Hearing, Speech Sight Info: Adequate Hearing Info: Adequate Speech Info: Adequate    SPECIAL CARE FACTORS FREQUENCY  PT (By licensed PT), OT (By licensed OT)     PT Frequency: (5) OT Frequency: (5)            Contractures      Additional Factors Info  Code Status, Allergies Code Status Info: (Full Code. ) Allergies Info: (Celebrex Celecoxib, Sulfa Antibiotics)           Current Medications (10/08/2018):  This is the current hospital active medication list Current Facility-Administered Medications  Medication Dose Route Frequency Provider Last Rate Last Dose  . 0.9 %  sodium chloride infusion   Intravenous Continuous Hooten, Laurice Record, MD 100 mL/hr at 10/07/18 2313    . acetaminophen (OFIRMEV) IV 1,000 mg  1,000 mg Intravenous Q6H Hooten, Laurice Record, MD 400 mL/hr at 10/08/18 0620 1,000 mg at 10/08/18 0620  . acetaminophen (TYLENOL) tablet 325-650 mg  325-650 mg Oral Q6H PRN Hooten, Laurice Record, MD      . alum & mag hydroxide-simeth (MAALOX/MYLANTA) 200-200-20 MG/5ML suspension 30 mL  30 mL Oral Q4H PRN  Hooten, Laurice Record, MD      . apixaban Arne Cleveland) tablet 5 mg  5 mg Oral BID Dereck Leep, MD   5 mg at 10/08/18 0842  . bisacodyl (DULCOLAX) suppository 10 mg  10 mg Rectal Daily PRN Hooten, Laurice Record, MD      . ceFAZolin (ANCEF) IVPB 2g/100 mL premix  2 g Intravenous Q6H Hooten, Laurice Record, MD 200 mL/hr at 10/08/18 0649 2 g at 10/08/18 0649  . clomiPHENE (CLOMID) tablet 25 mg  25 mg Oral Daily Hooten, Laurice Record, MD      . diphenhydrAMINE (BENADRYL) 12.5 MG/5ML elixir 12.5-25 mg  12.5-25 mg Oral Q4H PRN Hooten,  Laurice Record, MD      . ferrous sulfate tablet 325 mg  325 mg Oral BID WC Hooten, Laurice Record, MD   325 mg at 10/08/18 0843  . finasteride (PROSCAR) tablet 5 mg  5 mg Oral QHS Hooten, Laurice Record, MD   5 mg at 10/07/18 2221  . gabapentin (NEURONTIN) capsule 300 mg  300 mg Oral QHS Hooten, Laurice Record, MD   300 mg at 10/07/18 2221  . HYDROmorphone (DILAUDID) injection 0.5-1 mg  0.5-1 mg Intravenous Q4H PRN Hooten, Laurice Record, MD      . magnesium hydroxide (MILK OF MAGNESIA) suspension 30 mL  30 mL Oral Daily Hooten, Laurice Record, MD   30 mL at 10/08/18 0843  . menthol-cetylpyridinium (CEPACOL) lozenge 3 mg  1 lozenge Oral PRN Hooten, Laurice Record, MD       Or  . phenol (CHLORASEPTIC) mouth spray 1 spray  1 spray Mouth/Throat PRN Hooten, Laurice Record, MD      . metoCLOPramide (REGLAN) tablet 5-10 mg  5-10 mg Oral Q8H PRN Hooten, Laurice Record, MD       Or  . metoCLOPramide (REGLAN) injection 5-10 mg  5-10 mg Intravenous Q8H PRN Hooten, Laurice Record, MD      . metoCLOPramide (REGLAN) tablet 10 mg  10 mg Oral TID AC & HS Hooten, Laurice Record, MD   10 mg at 10/08/18 0842  . metoprolol succinate (TOPROL-XL) 24 hr tablet 25 mg  25 mg Oral BID Dereck Leep, MD   25 mg at 10/08/18 0843  . multivitamin with minerals tablet 1 tablet  1 tablet Oral Daily Hooten, Laurice Record, MD   1 tablet at 10/08/18 0841  . ondansetron (ZOFRAN) tablet 4 mg  4 mg Oral Q6H PRN Hooten, Laurice Record, MD       Or  . ondansetron (ZOFRAN) injection 4 mg  4 mg Intravenous Q6H PRN Hooten, Laurice Record, MD      . oxyCODONE (Oxy IR/ROXICODONE) immediate release tablet 10 mg  10 mg Oral Q4H PRN Dereck Leep, MD   10 mg at 10/08/18 0617  . oxyCODONE (Oxy IR/ROXICODONE) immediate release tablet 5 mg  5 mg Oral Q4H PRN Hooten, Laurice Record, MD   5 mg at 10/07/18 2222  . pantoprazole (PROTONIX) EC tablet 40 mg  40 mg Oral BID Dereck Leep, MD   40 mg at 10/08/18 0841  . senna-docusate (Senokot-S) tablet 1 tablet  1 tablet Oral BID Dereck Leep, MD   1 tablet at 10/08/18 0841  . sodium phosphate  (FLEET) 7-19 GM/118ML enema 1 enema  1 enema Rectal Once PRN Hooten, Laurice Record, MD      . tamsulosin (FLOMAX) capsule 0.4 mg  0.4 mg Oral QHS Hooten, Laurice Record, MD   0.4 mg at 10/07/18 2222  .  temazepam (RESTORIL) capsule 30 mg  30 mg Oral QHS Hooten, Laurice Record, MD   30 mg at 10/07/18 2222  . testosterone (ANDRODERM) 4 MG/24HR patch 1 patch  1 patch Transdermal Q14 Days Hooten, Laurice Record, MD      . traMADol Veatrice Bourbon) tablet 50-100 mg  50-100 mg Oral Q4H PRN Dereck Leep, MD   100 mg at 10/07/18 2339     Discharge Medications: Please see discharge summary for a list of discharge medications.  Relevant Imaging Results:  Relevant Lab Results:   Additional Information (SSN: [id number])  Dempsy Damiano, Veronia Beets, LCSW

## 2018-10-08 NOTE — Discharge Summary (Signed)
Physician Discharge Summary  Patient ID: Louis Salazar MRN: [id number] DOB/AGE: Sep 02, 1956 62 y.o.  Admit date: 10/07/2018 Discharge date: 10/10/2018  Admission Diagnoses:  PRIMARY OSTEOARTHRITIS   Discharge Diagnoses: Patient Active Problem List   Diagnosis Date Noted  . Total knee replacement status 10/07/2018  . DJD (degenerative joint disease), multiple sites 08/16/2018  . Primary osteoarthritis of right knee 07/14/2018  . DVT (deep venous thrombosis) ([place]) 04/09/2018  . Umbilical hernia without obstruction and without gangrene 08/24/2016  . Obesity 02/28/2016  . Depression 02/28/2016  . Parotiditis 02/14/2016  . Left leg swelling 02/14/2016  . Excessive drinking alcohol 02/14/2016  . Tachycardia 02/14/2016  . Blood in stool   . Other specified diseases of esophagus   . Gastritis   . Benign neoplasm of cecum   . Cellulitis and abscess of oral soft tissues   . Benign neoplasm of sigmoid colon   . 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 Essential (primary) hypertension 05/24/2015  . Brash 05/24/2015  . History of colon polyps 05/24/2015  . Arthralgia of shoulder 05/24/2015  . Insomnia 05/24/2015  . Primary osteoarthritis of both shoulders 04/27/2015  . Carpal tunnel syndrome 04/27/2015    Past Medical History:  Diagnosis Date  . Arthritis    right shoulder; right knee  . Depression 02/28/2016  . DVT (deep venous thrombosis) ([place]) 01/2018   RIGHT POPLITEAL  . Enlarged prostate   . Foreign body, eye    left eyelid  . Gastritis 10/24/2017  . GERD (gastroesophageal reflux disease)    nissan plication and this has improved  . Hemorrhoid   . History of colon polyps 05/24/2015  . Hypertension    controlled on med  . Neck pain    "EXPERIENCE CRICK IN NECK". comes and goes 2019  . Neuromuscular disorder ([place])    hands are tingly and go numb. possibly carpal tunnel.  right hand  . Pre-diabetes    A1C in 2019 was 6.0  . Substance abuse ([place])    hx of  inhalants, last use 2010.  regular  daily alcohol intake (2019)  . Umbilical hernia without obstruction and without gangrene 08/24/2016     Transfusion: No transfusions during this admission   Consultants (if any):   Discharged Condition: Improved  Hospital Course: Louis Salazar is an 62 y.o. male who was admitted 10/07/2018 with a diagnosis of degenerative arthrosis right knee and went to the operating room on 10/07/2018 and underwent the above named procedures.    Surgeries:Procedure(s): COMPUTER ASSISTED TOTAL KNEE ARTHROPLASTY on 10/07/2018  PRE-OPERATIVE DIAGNOSIS: Degenerative arthrosis of the right knee, primary  POST-OPERATIVE DIAGNOSIS:  Same  PROCEDURE:  Right total knee arthroplasty using computer-assisted navigation  SURGEON:  Marciano Salazar. M.D.  ASSISTANT:  Louis Peper, PA (present and scrubbed throughout the case, critical for assistance with exposure, retraction, instrumentation, and closure)  ANESTHESIA: spinal  ESTIMATED BLOOD LOSS: 50 mL  FLUIDS REPLACED: 2000 mL of crystalloid  TOURNIQUET TIME: 90 minutes  DRAINS: 2 medium Hemovac drains  SOFT TISSUE RELEASES: Anterior cruciate ligament, posterior cruciate ligament, deep and superficial medial collateral ligament, patellofemoral ligament  IMPLANTS UTILIZED: DePuy Attune size 8 posterior stabilized femoral component (cemented), size 9 rotating platform tibial component (cemented), 41 mm medialized dome patella (cemented), and a 5 mm stabilized rotating platform polyethylene insert.  INDICATIONS FOR SURGERY: Louis Salazar is a 62 y.o. year old male with a long history of progressive knee pain. X-rays demonstrated severe degenerative changes in tricompartmental fashion.  The patient had not seen any significant improvement despite conservative nonsurgical intervention. After discussion of the risks and benefits of surgical intervention, the patient expressed understanding of the risks  benefits and agree with plans for total knee arthroplasty.   The risks, benefits, and alternatives were discussed at length including but not limited to the risks of infection, bleeding, nerve injury, stiffness, blood clots, the need for revision surgery, cardiopulmonary complications, among others, and they were willing to proceed. Patient tolerated the surgery well. No complications .Patient was taken to PACU where she was stabilized and then transferred to the orthopedic floor.  Patient started on aspirin and Eliquis which she was on prior to admission. Foot pumps applied bilaterally at 80 mm hgb. Heels elevated off bed with rolled towels. No evidence of DVT. Calves non tender. Negative Homan. Physical therapy started on day #1 for gait training and transfer with OT starting on  day #1 for ADL and assisted devices. Patient has done well with therapy. Ambulated less than 50 feet upon being discharged.   Patient's IV And Foley were discontinued on day #1 with Hemovac being discontinued on day #2. Dressing was changed on day 2 prior to patient being discharged   He was given perioperative antibiotics:  Anti-infectives (From admission, onward)   Start     Dose/Rate Route Frequency Ordered Stop   10/08/18 0000  ceFAZolin (ANCEF) IVPB 2g/100 mL premix     2 g 200 mL/hr over 30 Minutes Intravenous Every 6 hours 10/07/18 2056 10/08/18 2359   10/07/18 1340  ceFAZolin (ANCEF) 2-4 GM/100ML-% IVPB    Note to Pharmacy:  Louis Salazar   : cabinet override      10/07/18 1340 10/08/18 0159   10/07/18 0600  ceFAZolin (ANCEF) IVPB 2g/100 mL premix  Status:  Discontinued     2 g 200 mL/hr over 30 Minutes Intravenous On call to O.R. 10/06/18 2235 10/07/18 1403    .  He was fitted with AV 1 compression foot pump devices, instructed on heel pumps, early ambulation, and fitted with TED stockings bilaterally for DVT prophylaxis.  He benefited maximally from the hospital stay and there were no  complications.    Recent vital signs:  Vitals:   10/07/18 2333 10/08/18 0024  BP: 110/73 113/76  Pulse: 78 81  Resp: 18 18  Temp: (!) 97.3 F (36.3 C) 98 F (36.7 C)  SpO2: 95% 100%    Recent laboratory studies:  Lab Results  Component Value Date   HGB 15.3 09/25/2018   HGB 15.1 03/23/2018   HGB 14.9 08/15/2017   Lab Results  Component Value Date   WBC 7.6 09/25/2018   PLT 208 09/25/2018   Lab Results  Component Value Date   INR 1.05 09/25/2018   Lab Results  Component Value Date   NA 141 09/25/2018   K 4.3 09/25/2018   CL 109 09/25/2018   CO2 25 09/25/2018   BUN 22 09/25/2018   CREATININE 0.91 09/25/2018   GLUCOSE 112 (H) 09/25/2018    Discharge Medications:   Allergies as of 10/08/2018      Reactions   Celebrex [celecoxib] Hives, Rash   Sulfa Antibiotics Other (See Comments)   Unknown - childhood allergy      Medication List    STOP taking these medications   ibuprofen 200 MG tablet Commonly known as:  ADVIL,MOTRIN     TAKE these medications   amoxicillin 500 MG capsule Commonly known as:  AMOXIL Take 2,000 mg by  mouth See admin instructions. Take 2000 mg 1 hour prior to dental work   ANDRODERM 4 MG/24HR Pt24 patch Generic drug:  testosterone Place 1 patch onto the skin every 14 (fourteen) days.   aspirin 325 MG EC tablet Take 650 mg by mouth daily as needed for pain. Notes to patient:  May begin taking once Lovenox injections are completed   CENTRUM SILVER PO Take 1 tablet by mouth daily.   clomiPHENE 50 MG tablet Commonly known as:  CLOMID Take 25 mg by mouth daily.   ELIQUIS 5 MG Tabs tablet Generic drug:  apixaban TAKE 1 TABLET BY MOUTH TWICE DAILY   finasteride 5 MG tablet Commonly known as:  PROSCAR Take 5 mg by mouth at bedtime.   loperamide 2 MG capsule Commonly known as:  IMODIUM Take 2 mg by mouth daily.   metoprolol succinate 25 MG 24 hr tablet Commonly known as:  TOPROL-XL Take 1 tablet (25 mg total) by mouth 2  (two) times daily.   oxyCODONE 5 MG immediate release tablet Commonly known as:  Oxy IR/ROXICODONE Take 1 tablet (5 mg total) by mouth every 4 (four) hours as needed for moderate pain (pain score 4-6).   tamsulosin 0.4 MG Caps capsule Commonly known as:  FLOMAX Take 0.4 mg by mouth at bedtime.   temazepam 30 MG capsule Commonly known as:  RESTORIL Take 30 mg by mouth at bedtime.   traMADol 50 MG tablet Commonly known as:  ULTRAM Take 50 mg by mouth daily as needed for moderate pain. What changed:  Another medication with the same name was added. Make sure you understand how and when to take each.   traMADol 50 MG tablet Commonly known as:  ULTRAM Take 1-2 tablets (50-100 mg total) by mouth every 4 (four) hours as needed for moderate pain. What changed:  You were already taking a medication with the same name, and this prescription was added. Make sure you understand how and when to take each.            Durable Medical Equipment  (From admission, onward)         Start     Ordered   10/07/18 2056  DME Walker rolling  Once    Question:  Patient needs a walker to treat with the following condition  Answer:  Total knee replacement status   10/07/18 2056   10/07/18 2056  DME Bedside commode  Once    Question:  Patient needs a bedside commode to treat with the following condition  Answer:  Total knee replacement status   10/07/18 2056          Diagnostic Studies: Dg Knee Right Port  Result Date: 10/07/2018 CLINICAL DATA:  Knee replacement EXAM: PORTABLE RIGHT KNEE - 1-2 VIEW COMPARISON:  CT 12/21/2017 FINDINGS: Status post knee replacement with normal alignment and intact hardware. Gas within the soft tissues consistent with recent surgery. Surgical drain in the suprapatellar region. IMPRESSION: Status post right knee replacement with expected postsurgical changes Electronically Signed   By: Donavan Foil M.D.   On: 10/07/2018 19:24   Vas [place] Lower Extremity Venous  (dvt)  Result Date: 09/30/2018  Lower Venous Study Other Indications: IVC filter placed 09/24/2018 for preop Right knee surgery. Comparison Study: 03/23/2018 Performing Technologist: Concha Norway RVT  Examination Guidelines: A complete evaluation includes B-mode imaging, spectral Doppler, color Doppler, and power Doppler as needed of all accessible portions of each vessel. Bilateral testing is considered an integral part of  a complete examination. Limited examinations for reoccurring indications may be performed as noted.  Right Venous Findings: ++---------------+---------+-----------+----------+-------+ CompressibilityPhasicitySpontaneityPropertiesSummary ++---------------+---------+-----------+----------+-------+ Full           Yes      Yes                          ++---------------+---------+-----------+----------+-------+    Summary: Right: There is no evidence of deep vein thrombosis in the lower extremity.There is no evidence of superficial venous thrombosis. Left: No evidence of common femoral vein obstruction.  *See table(s) above for measurements and observations. Electronically signed by Hortencia Pilar MD on 09/30/2018 at 4:55:51 PM.    Final     Disposition:   Discharge Instructions    Diet - low sodium heart healthy   Complete by:  As directed    Increase activity slowly   Complete by:  As directed       Follow-up Information    Watt Climes, PA On 10/22/2018.   Specialty:  Physician Assistant Why:  at 1:15pm Contact information: [place] [address] [phone]        Dereck Leep, MD On 11/28/2018.   Specialty:  Orthopedic Surgery Why:  at 10:45am Contact information: [place] [address] [phone]            Signed: Watt Climes 10/08/2018, 7:45 AM

## 2018-10-08 NOTE — Evaluation (Signed)
Occupational Therapy Evaluation Patient Details Name: Louis Salazar MRN: [id number] DOB: 20-Apr-1956 Today's Date: 10/08/2018    History of Present Illness Pt is a 62 y.o. male s/p R TKA secondary degenerative arthrosis 10/07/18.  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   Clinical Impression   Pt seen for OT evaluation this date, POD#1 from above surgery. Pt was independent in all ADLs prior to surgery, however occasionally using a single axillary crutch for mobility in home and community  due to R knee pain. Pt is eager to return to PLOF with less pain and improved safety and independence. Pt currently requires mod assist for LB dressing sit<stand and Min A for LB bathing while in seated position due to pain and limited AROM of R knee. Pt instructed in polar care mgt, falls prevention strategies, home/routines modifications, DME/AE for LB bathing and dressing tasks, and compression stocking mgt. Pt required Min A for functional transfer. Pt verbalized understanding of all education/instruction presented at this time. Pt would benefit from skilled OT services including additional instruction in dressing techniques with or without assistive devices for dressing and bathing skills to support recall and carryover prior to discharge and ultimately to maximize safety, independence, and minimize falls risk and caregiver burden. Do not currently anticipate any OT needs following this hospitalization.      Follow Up Recommendations  Home health OT    Equipment Recommendations  Tub/shower bench    Recommendations for Other Services       Precautions / Restrictions Precautions Precautions: Knee;Fall Precaution Booklet Issued: Yes (comment) Required Braces or Orthoses: Knee Immobilizer - Right Knee Immobilizer - Right:  Discontinue once straight leg raise with < 10 degree lag Restrictions Weight Bearing Restrictions: Yes RLE Weight Bearing: Weight bearing as tolerated      Mobility Bed Mobility Overal bed mobility: Needs Assistance Bed Mobility: Sit to Supine       Sit to supine: Modified independent (Device/Increase time)    Transfers Overall transfer level: Needs assistance Equipment used: Rolling walker (2 wheeled) Transfers: Sit to/from Stand Sit to Stand: Min assist            Balance Overall balance assessment: Needs assistance Sitting-balance support: Feet supported;Single extremity supported;No upper extremity supported(intermittent single extremity support) Sitting balance-Leahy Scale: Good Sitting balance - Comments: steady sitting reaching within BOS   Standing balance support: Bilateral upper extremity supported Standing balance-Leahy Scale: Poor                            ADL either performed or assessed with clinical judgement   ADL Overall ADL's : Needs assistance/impaired Eating/Feeding: Independent;Sitting   Grooming: Independent;Sitting   Upper Body Bathing: Sitting;Supervision/ safety   Lower Body Bathing: Sitting/lateral leans;Minimal assistance   Upper Body Dressing : Sitting;Supervision/safety   Lower Body Dressing: Moderate assistance;Sit to/from stand;With Ecologist Details (indicate cue type and reason): Will assess at next session                  Vision Baseline Vision/History: Wears glasses Wears Glasses: Reading only Patient Visual Report: No change from baseline       Perception     Praxis      Pertinent Vitals/Pain Pain Assessment: 0-10 Pain Score: 5  Pain Location: R knee Pain Descriptors / Indicators: Sore;Tender Pain Intervention(s): Monitored during session;Limited activity within patient's tolerance;Repositioned;Ice applied     Hand Dominance     Extremity/Trunk Assessment  Upper Extremity Assessment Upper Extremity Assessment: Overall WFL for tasks assessed(Pt 4+ to 5/5 BUE. Functional ROM in shoulders)   Lower Extremity Assessment Lower Extremity Assessment: Defer to PT evaluation;RLE deficits/detail RLE Deficits / Details: limited strength, ROM and flexibilty in RLE.  LLE Deficits / Details: strength and ROM WFL   Cervical / Trunk Assessment Cervical / Trunk Assessment: Normal   Communication Communication Communication: No difficulties   Cognition Arousal/Alertness: Awake/alert Behavior During Therapy: WFL for tasks assessed/performed Overall Cognitive Status: Within Functional Limits for tasks assessed                                     General Comments  BP 113/81 at EOB    Exercises  Other Exercises \ Other Exercises: Pt educated in polar care mgt including donning/doffing, wear schedule and positioning. Other Exercises: Pt educated in compression sock mgt including donning/doffing, wear schedule and positioning. Other Exercises: Pt eduated in use of AE for LB dressing tasks.   Shoulder Instructions      Home Living Family/patient expects to be discharged to:: Private residence Living Arrangements: Alone Available Help at Discharge: Family(Son will live 24/7 with pt for first week; ex wife with check in for an hour each PRN) Type of Home: House Home Access: Stairs to enter [employer] of Steps: 3 (B posts at top) Chiropractor: None Home Layout: One level     Bathroom Shower/Tub: Teacher, early years/pre: Handicapped height     Home Equipment: [place] - single point;Crutches          Prior Functioning/Environment Level of Independence: Independent with assistive device(s)        Comments: Pt ambulating with single axillary crutch in home and at work.  No h/o falls in past 6 months. Independent with all ADLs. Enjoys riding motorcycles.        OT Problem List: Decreased  strength;Impaired balance (sitting and/or standing);Pain;Decreased range of motion;Decreased activity tolerance;Decreased knowledge of use of DME or AE      OT Treatment/Interventions: Self-care/ADL training;DME and/or AE instruction;Therapeutic activities;Therapeutic exercise;Patient/family education;Balance training    OT Goals(Current goals can be found in the care plan section) Acute Rehab OT Goals Patient Stated Goal: to get back home OT Goal Formulation: With patient Time For Goal Achievement: 10/22/18 Potential to Achieve Goals: Good ADL Goals Pt Will Perform Lower Body Dressing: with supervision;sit to/from stand;with adaptive equipment Additional ADL Goal #1: Pt will educate caregiver on polar care mgt including donning/doffing, wear schedule and positioning. Additional ADL Goal #2: Pt will educate caregiver on compression sock mgt donning/doffing, wear schedule and positioning.  OT Frequency: Min 1X/week   Barriers to D/C:            Co-evaluation              AM-PAC PT "6 Clicks" Daily Activity     Outcome Measure Help from another person eating meals?: None Help from another person taking care of personal grooming?: None Help from another person toileting, which includes using toliet, bedpan, or urinal?: A Little Help from another person bathing (including washing, rinsing, drying)?: A Little Help from another person to put on and taking off regular upper body clothing?: None Help from  another person to put on and taking off regular lower body clothing?: A Lot 6 Click Score: 21   End of Session Equipment Utilized During Treatment: Gait belt;Rolling walker  Activity Tolerance: Patient tolerated treatment well Patient left: in bed;with call bell/phone within reach;with bed alarm set;with SCD's reapplied  OT Visit Diagnosis: Other abnormalities of gait and mobility (R26.89);Pain Pain - Right/Left: Right Pain - part of body: Knee                Time:  1610-9604 OT Time Calculation (min): 41 min Charges:     Jadene Pierini OTS   10/08/2018, 5:09 PM

## 2018-10-08 NOTE — Anesthesia Postprocedure Evaluation (Signed)
Anesthesia Post Note  Patient: Louis Salazar  Procedure(s) Performed: COMPUTER ASSISTED TOTAL KNEE ARTHROPLASTY (Right Knee)  Patient location during evaluation: PACU Anesthesia Type: Spinal Level of consciousness: awake and alert Pain management: pain level controlled Vital Signs Assessment: post-procedure vital signs reviewed and stable Respiratory status: spontaneous breathing, nonlabored ventilation and respiratory function stable Cardiovascular status: blood pressure returned to baseline and stable Postop Assessment: no apparent nausea or vomiting Anesthetic complications: no     Last Vitals:  Vitals:   10/07/18 2333 10/08/18 0024  BP: 110/73 113/76  Pulse: 78 81  Resp: 18 18  Temp: (!) 36.3 C 36.7 C  SpO2: 95% 100%    Last Pain:  Vitals:   10/08/18 0220  TempSrc:   PainSc: [place]

## 2018-10-08 NOTE — Progress Notes (Signed)
Physical Therapy Treatment Patient Details Name: Louis Salazar MRN: [id number] DOB: 03/24/1956 Today's Date: 10/08/2018    History of Present Illness Pt is a 62 y.o. male s/p R TKA secondary degenerative arthrosis 10/07/18.  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    PT Comments    R knee noted to be buckling upon attempted ambulation with RW so R KI donned.  Pt able to stand with RW with min assist from recliner and then ambulate 15 feet with RW with min assist and close chair follow.  Improved tolerance overall noted during session. Pain 3/10 R knee beginning of session; increased with activity (up to 10/10) but decreased to 4/10 by end of session.  BP 119/83 beginning of session and increased to 130/87 end of session.  HR WFL during session.  With R knee AAROM in sitting pt did report feeling lightheaded (with concerns of blacking out) so deferred further R knee ROM (anticipate pain related).  Will continue to progress pt with strengthening, knee ROM, and progressive ambulation per pt tolerance.   Follow Up Recommendations  SNF     Equipment Recommendations  Rolling walker with 5" wheels;3in1 (PT)    Recommendations for Other Services OT consult     Precautions / Restrictions Precautions Precautions: Knee;Fall Precaution Booklet Issued: Yes (comment) Required Braces or Orthoses: Knee Immobilizer - Right Knee Immobilizer - Right: Discontinue once straight leg raise with < 10 degree lag Restrictions Weight Bearing Restrictions: Yes RLE Weight Bearing: Weight bearing as tolerated    Mobility  Bed Mobility               General bed mobility comments: Deferred (pt up in chair beginning of session and sitting edge of bed with OT present end of session)  Transfers Overall transfer level: Needs  assistance Equipment used: Rolling walker (2 wheeled) Transfers: Sit to/from Stand Sit to Stand: Min assist         General transfer comment: assist to initiate and come to full stand with RW (x2 trials--1 without KI and 1 with KI on r LE); vc's for UE and LE placement and technique required  Ambulation/Gait Ambulation/Gait assistance: Min assist;+2 safety/equipment(chair follow for safety) Gait Distance (Feet): 15 Feet Assistive device: Rolling walker (2 wheeled)   Gait velocity: decreased   General Gait Details: R knee initially buckling so pt sat back down in chair and R LE KI donned; vc's to increase B UE support through RW to offweight R LE; vc's for R quad set during R LE stance time; vc's for walker use and gait technique; antalgic gait noted with decreased stance time R LE   Stairs             Wheelchair Mobility    Modified Rankin (Stroke Patients Only)       Balance Overall balance assessment: Needs assistance Sitting-balance support: No upper extremity supported;Feet supported Sitting balance-Leahy Scale: Good Sitting balance - Comments: steady sitting reaching within BOS   Standing balance support: Single extremity supported Standing balance-Leahy Scale: Poor Standing balance comment: pt requiring at least single UE support for static standing balance                            Cognition Arousal/Alertness: Awake/alert Behavior During Therapy: [facility] for tasks assessed/performed  Overall Cognitive Status: Within Functional Limits for tasks assessed                                        Exercises Total Joint Exercises Long Arc Quad: AAROM;Strengthening;Right;10 reps;Seated General Exercises - Lower Extremity Hip Flexion/Marching: AROM;Strengthening;Right;10 reps;Seated Other Exercises Other Exercises: R knee flexion (in sitting) AAROM with end range hold x15 seconds (x2 reps)    General Comments General comments (skin  integrity, edema, etc.): R knee dressing, hemovac, and polar care intact beginning/end of session      Pertinent Vitals/Pain Pain Assessment: 0-10 Pain Score: 4  Pain Location: R knee Pain Descriptors / Indicators: Sore;Tender Pain Intervention(s): Limited activity within patient's tolerance;Monitored during session;Premedicated before session;Repositioned;Other (comment)(OT to place polar care end of session)    Home Living                      Prior Function            PT Goals (current goals can now be found in the care plan section) Acute Rehab PT Goals Patient Stated Goal: to be able to walk again PT Goal Formulation: With patient Time For Goal Achievement: 10/22/18 Potential to Achieve Goals: Good Progress towards PT goals: Progressing toward goals    Frequency    BID      PT Plan Current plan remains appropriate    Co-evaluation              AM-PAC PT "6 Clicks" Daily Activity  Outcome Measure  Difficulty turning over in bed (including adjusting bedclothes, sheets and blankets)?: A Lot Difficulty moving from lying on back to sitting on the side of the bed? : Unable Difficulty sitting down on and standing up from a chair with arms (e.g., wheelchair, bedside commode, etc,.)?: Unable Help needed moving to and from a bed to chair (including a wheelchair)?: A Lot Help needed walking in hospital room?: Total Help needed climbing 3-5 steps with a railing? : Total 6 Click Score: 8    End of Session Equipment Utilized During Treatment: Gait belt Activity Tolerance: Patient limited by pain Patient left: (sitting on edge of bed with OT present) Nurse Communication: Mobility status;Precautions;Weight bearing status;Other (comment)(Pt's pain status and vitals during session) PT Visit Diagnosis: Other abnormalities of gait and mobility (R26.89);Muscle weakness (generalized) (M62.81);Difficulty in walking, not elsewhere classified (R26.2);Pain Pain -  Right/Left: Right Pain - part of body: Knee     Time: 4818-5631 PT Time Calculation (min) (ACUTE ONLY): 38 min  Charges:  $Gait Training: 8-22 mins $Therapeutic Exercise: 8-22 mins $Therapeutic Activity: 8-22 mins                    [name], PT 10/08/18, 2:32 PM [phone]

## 2018-10-08 NOTE — Progress Notes (Addendum)
   Subjective: 1 Day Post-Op Procedure(s) (LRB): COMPUTER ASSISTED TOTAL KNEE ARTHROPLASTY (Right) Patient reports pain as 3 on 0-10 scale.   Patient is well, and has had no acute complaints or problems We will start therapy today.  Plan is to go Home after hospital stay. no nausea and no vomiting Patient denies any chest pains or shortness of breath. Objective: Vital signs in last 24 hours: Temp:  [97 F (36.1 C)-98.2 F (36.8 C)] 98 F (36.7 C) (11/12 0024) Pulse Rate:  [72-93] 81 (11/12 0024) Resp:  [10-23] 18 (11/12 0024) BP: (96-127)/(73-101) 113/76 (11/12 0024) SpO2:  [91 %-100 %] 100 % (11/12 0024) FiO2 (%):  [21 %] 21 % (11/11 2057) Heels are non tender and elevated off the bed using rolled towels as well as bone foam under operative leg  Intake/Output from previous day: 11/11 0701 - 11/12 0700 In: 3038.5 [P.O.:237; I.V.:2601.5; IV Piggyback:200] Out: 1780 [UUVOZ:3664; Drains:275; Blood:50] Intake/Output this shift: No intake/output data recorded.  No results for input(s): HGB in the last 72 hours. No results for input(s): WBC, RBC, HCT, PLT in the last 72 hours. No results for input(s): NA, K, CL, CO2, BUN, CREATININE, GLUCOSE, CALCIUM in the last 72 hours. No results for input(s): LABPT, INR in the last 72 hours.  EXAM General - Patient is Alert, Appropriate and Oriented Extremity - Neurologically intact Neurovascular intact Sensation intact distally Intact pulses distally Dorsiflexion/Plantar flexion intact Compartment soft Dressing - dressing C/D/I Motor Function - intact, moving foot and toes well on exam.  Not able to do straight leg raise on his own on the right leg   Past Medical History:  Diagnosis Date  . Arthritis    right shoulder; right knee  . Depression 02/28/2016  . DVT (deep venous thrombosis) ([place]) 01/2018   RIGHT POPLITEAL  . Enlarged prostate   . Foreign body, eye    left eyelid  . Gastritis 10/24/2017  . GERD (gastroesophageal  reflux disease)    nissan plication and this has improved  . Hemorrhoid   . History of colon polyps 05/24/2015  . Hypertension    controlled on med  . Neck pain    "EXPERIENCE CRICK IN NECK". comes and goes 2019  . Neuromuscular disorder ([place])    hands are tingly and go numb. possibly carpal tunnel.  right hand  . Pre-diabetes    A1C in 2019 was 6.0  . Substance abuse ([place])    hx of inhalants, last use 2010.  regular  daily alcohol intake (2019)  . Umbilical hernia without obstruction and without gangrene 08/24/2016    Assessment/Plan: 1 Day Post-Op Procedure(s) (LRB): COMPUTER ASSISTED TOTAL KNEE ARTHROPLASTY (Right) Active Problems:   Total knee replacement status  Estimated body mass index is 36.68 kg/m as calculated from the following:   Height as of 09/30/18: 5\' 11"  (1.803 m).   Weight as of 09/30/18: 119.3 kg. Advance diet Up with therapy D/C IV fluids Plan for discharge tomorrow Discharge home with home health  Labs: None DVT Prophylaxis - Aspirin, Foot Pumps, TED hose and Eliquis Weight-Bearing as tolerated to right leg D/C O2 and Pulse OX and try on Room Air Begin working on bowel movement  Jaquelyn Sakamoto R. Saxman Alvord 10/08/2018, 7:40 AM

## 2018-10-08 NOTE — Progress Notes (Signed)
Per [facility]tila [facility] liaison [facility] SNF authorization has been received. Patient can D/C to [facility] tomorrow if medically stable. Patient is aware of above.   [employer], LCSW [phone]

## 2018-10-08 NOTE — Evaluation (Signed)
Physical Therapy Evaluation Patient Details Name: Louis Salazar MRN: [id number] DOB: [birth year] Today's Date: 10/08/2018   History of Present Illness  Pt is a 62 y.o. male s/p R TKA secondary degenerative arthrosis 10/07/18.  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  Clinical Impression  Prior to hospital admission, pt was ambulatory with single axillary crutch.  Pt lives alone in 1 level home with 3 steps to enter (no railings).  Plans to have son stay with pt for 1 week (24/7 assist) and then pt's ex-wife will stop by to check on pt 1 hour per day after that.  Pt initially requiring min assist for SLR R LE but last few repetitions pt able to perform without assist (with cueing and practice for technique).  Currently pt is min assist semi-supine to sit; min assist x2 to stand with RW; and min assist x2 to ambulate a few feet with RW bed to recliner (R knee mildly flexed in R LE stance phase but not buckling).  Pt c/o significant dizziness initially upon sitting edge of bed (BP 106/84 with HR 62 bpm); after about 10 minutes sitting edge of bed dizziness resolved and pt requesting to stand and get to chair.  Pt stood and reporting dizziness initially standing and immediately sat down but reported dizziness went away very quickly and wanting to try again.  Pt stood again and able to get to chair with 2 assist and RW but after sitting down pt c/o significant dizziness again (BP noted to be 84/59 and pt reclined in chair with LE's elevated).  Symptoms improved and pt sat up in chair and agreeable to assess R knee flexion ROM but with R knee flexion, pt suddenly reporting things going "black" (anticipate d/t pain) so pt assisted back laying down with LE's elevated (pt verbalizing entire time with PT and symptoms improved after laying  down).  Pt then gradually returned to sitting up and pt's BP 112/85.  Nurse notified regarding above symptoms/events and vitals (including HR decreasing from 73 bpm at rest to 62 bpm with activity).  Pt would benefit from skilled PT to address noted impairments and functional limitations (see below for any additional details).  Upon hospital discharge, currently recommend STR d/t above assist and concerns but will continue to work towards pt's goal of home discharge as appropriate.    Follow Up Recommendations SNF    Equipment Recommendations  Rolling walker with 5" wheels;3in1 (PT)    Recommendations for Other Services OT consult     Precautions / Restrictions Precautions Precautions: Knee;Fall Precaution Booklet Issued: Yes (comment) Required Braces or Orthoses: Knee Immobilizer - Right Knee Immobilizer - Right: Discontinue once straight leg raise with < 10 degree lag Restrictions Weight Bearing Restrictions: Yes RLE Weight Bearing: Weight bearing as tolerated      Mobility  Bed Mobility Overal bed mobility: Needs Assistance Bed Mobility: Supine to Sit     Supine to sit: Min assist;HOB elevated     General bed mobility comments: assist for R LE; use of bed rail  Transfers Overall transfer level: Needs assistance Equipment used: Rolling walker (2 wheeled) Transfers: Sit to/from Stand Sit to Stand: Min assist;+2 physical assistance;From elevated surface         General transfer comment: assist to initiate and come to full stand with RW (x2 trials; pt  sat back down on bed on 1st trial d/t c/o dizziness); bed mildly elevated; vc's for UE and LE placement with transfers and use of walker  Ambulation/Gait Ambulation/Gait assistance: Min assist;+2 physical assistance Gait Distance (Feet): 3 Feet(bed to recliner) Assistive device: Rolling walker (2 wheeled)   Gait velocity: decreased   General Gait Details: R knee mildly flexed in stance time but not buckling; vc's to  increase B UE support through RW to offweight R LE; decreased R LE stance time; decreased B LE step length  Stairs            Wheelchair Mobility    Modified Rankin (Stroke Patients Only)       Balance Overall balance assessment: Needs assistance Sitting-balance support: No upper extremity supported;Feet supported Sitting balance-Leahy Scale: Good Sitting balance - Comments: steady sitting reaching within BOS   Standing balance support: Bilateral upper extremity supported Standing balance-Leahy Scale: Poor Standing balance comment: pt requiring B UE support for static standing balance                             Pertinent Vitals/Pain Pain Assessment: 0-10 Pain Score: 3  Pain Location: R knee Pain Descriptors / Indicators: Sore;Tender Pain Intervention(s): Limited activity within patient's tolerance;Monitored during session;Premedicated before session;Repositioned;Other (comment)(polar care applied and activated)    Home Living Family/patient expects to be discharged to:: Private residence Living Arrangements: Alone Available Help at Discharge: Family(Son plans to stay with pt for 1 week (24/7 assist) and after that Amy (pt's ex-wife) will stop by 1 hour per day to assist) Type of Home: House Home Access: Stairs to enter Entrance Stairs-Rails: None Entrance Stairs-Number of Steps: 3 (B posts at top) Home Layout: One level [place]: [place] - single point;Crutches      Prior Function Level of Independence: Independent with assistive device(s)         Comments: Pt ambulating with single axillary crutch.  No h/o falls in past 6 months.     Hand Dominance        Extremity/Trunk Assessment   Upper Extremity Assessment Upper Extremity Assessment: Overall WFL for tasks assessed    Lower Extremity Assessment Lower Extremity Assessment: RLE deficits/detail;LLE deficits/detail RLE Deficits / Details: at least 3/5 hip flexion; at least 2+/5 knee  flexion/extension (limited d/t pain); at least 3/5 DF/PF AROM LLE Deficits / Details: strength and ROM WFL    Cervical / Trunk Assessment Cervical / Trunk Assessment: Normal  Communication   Communication: No difficulties  Cognition Arousal/Alertness: Awake/alert Behavior During Therapy: WFL for tasks assessed/performed Overall Cognitive Status: Within Functional Limits for tasks assessed                                        General Comments General comments (skin integrity, edema, etc.): R knee dressing, hemovac, and polar care intact beginning/end of session (NT came end of session to empty hemovac).  Nursing cleared pt for participation in physical therapy.  Pt agreeable to PT session.  End of session pt mentioning not eating since Sunday d/t later surgery yesterday (pt's breakfast tray came later during therapy session but pt declined any food until he got into chair and reported his blood sugar was taken recently and was good); pt set up with food and drink end of session.    Exercises Total Joint Exercises Ankle Circles/Pumps: AROM;Strengthening;Both;10  reps;Supine Quad Sets: AROM;Strengthening;Both;10 reps;Supine Short Arc Quad: AAROM;Strengthening;Right;10 reps;Supine Heel Slides: AAROM;Strengthening;Right;10 reps;Supine Hip ABduction/ADduction: AAROM;Strengthening;Right;10 reps;Supine Straight Leg Raises: AAROM;AROM;Strengthening;Right;10 reps;Supine Goniometric ROM: R knee extension 7 degrees short of neutral semi-supine in bed; R knee flexion 72 degrees sitting edge of recliner (pt using L LE to assist with R knee flexion)   Assessment/Plan    PT Assessment Patient needs continued PT services  PT Problem List Decreased strength;Decreased range of motion;Decreased activity tolerance;Decreased balance;Decreased mobility;Decreased knowledge of use of DME;Decreased knowledge of precautions;Cardiopulmonary status limiting activity;Pain;Decreased skin  integrity       PT Treatment Interventions DME instruction;Gait training;Stair training;Functional mobility training;Therapeutic activities;Therapeutic exercise;Balance training;Patient/family education    PT Goals (Current goals can be found in the Care Plan section)  Acute Rehab PT Goals Patient Stated Goal: to go home PT Goal Formulation: With patient Time For Goal Achievement: 10/22/18 Potential to Achieve Goals: Fair    Frequency BID   Barriers to discharge Decreased caregiver support      Co-evaluation               AM-PAC PT "6 Clicks" Daily Activity  Outcome Measure Difficulty turning over in bed (including adjusting bedclothes, sheets and blankets)?: A Lot Difficulty moving from lying on back to sitting on the side of the bed? : Unable Difficulty sitting down on and standing up from a chair with arms (e.g., wheelchair, bedside commode, etc,.)?: Unable Help needed moving to and from a bed to chair (including a wheelchair)?: A Lot Help needed walking in hospital room?: Total Help needed climbing 3-5 steps with a railing? : Total 6 Click Score: 8    End of Session Equipment Utilized During Treatment: Gait belt Activity Tolerance: Treatment limited secondary to medical complications (Comment)(dizziness with activity; nurse notified) Patient left: in chair;with call bell/phone within reach;with chair alarm set;with SCD's reapplied;Other (comment)(B heels elevated via towel rolls; polar care activated) Nurse Communication: Mobility status;Precautions;Weight bearing status;Other (comment)(Pt's symptoms, BP, and HR during session) PT Visit Diagnosis: Other abnormalities of gait and mobility (R26.89);Muscle weakness (generalized) (M62.81);Difficulty in walking, not elsewhere classified (R26.2);Pain Pain - Right/Left: Right Pain - part of body: Knee    Time: 2876-8115 PT Time Calculation (min) (ACUTE ONLY): 70 min   Charges:   PT Evaluation $PT Eval Moderate  Complexity: 1 Mod PT Treatments $Therapeutic Exercise: 23-37 mins $Therapeutic Activity: 8-22 mins       [name], PT 10/08/18, 10:50 AM [phone]

## 2018-10-08 NOTE — Clinical Social Work Note (Signed)
Clinical Social Work Assessment  Patient Details  Name: Louis Salazar MRN: [id number] Date of Birth: 1956-01-30  Date of referral:  10/08/18               Reason for consult:  Facility Placement                Permission sought to share information with:  Chartered certified accountant granted to share information::  Yes, Verbal Permission Granted  Name::      [place]::   [place]   Relationship::     Contact Information:     Housing/Transportation Living arrangements for the past 2 months:  [place] of Information:  Patient Patient Interpreter Needed:  None Criminal Activity/Legal Involvement Pertinent to Current Situation/Hospitalization:  No - Comment as needed Significant Relationships:  Adult Children Lives with:  Self Do you feel safe going back to the place where you live?    Need for family participation in patient care:     Care giving concerns: Patient lives in [place] alone.    Social Worker assessment / plan: Holiday representative (CSW) received SNF consult. PT is recommending SNF. CSW met with patient and his brother in law was at bedside. Patient was alert and oriented X4 and was sitting up in the chair at bedside. CSW introduced self and explained role of CSW department. Per patient he lives alone in [place] and his son [name] is going to come stay with him for 1 week. CSW is familiar with patient from joint class. CSW explained SNF process and that [place] will have to approve it. Patient is agreeable to SNF search in [facility]. FL2 complete and faxed out. CSW presented bed offers to patient and he chose [facility]. Per [facility]tila [facility] liaison she will start [facility] SNF authorization today. CSW will continue to follow and assist as needed.   Employment status:  Retired Forensic scientist:  Managed Care PT Recommendations:  [place] / Referral to community resources:  [place]  Patient/Family's Response to care:  Patient is agreeable to D/C to [facility].   Patient/Family's Understanding of and Emotional Response to Diagnosis, Current Treatment, and Prognosis:  Patient was very pleasant and thanked CSW for assistance.   Emotional Assessment Appearance:  Appears stated age Attitude/Demeanor/Rapport:    Affect (typically observed):  Accepting, Adaptable, Pleasant Orientation:  Oriented to Self, Oriented to Place, Oriented to  Time, Oriented to Situation Alcohol / Substance use:  Not Applicable Psych involvement (Current and /or in the community):  No (Comment)  Discharge Needs  Concerns to be addressed:  Discharge Planning Concerns Readmission within the last 30 days:  No Current discharge risk:  Dependent with Mobility Barriers to Discharge:  Continued Medical Work up   [employer], Veronia Beets, LCSW 10/08/2018, 3:23 PM

## 2018-10-09 NOTE — Progress Notes (Addendum)
Per Ortho PA note today patient will discharge tomorrow. Plan is for patient to D/C to [facility]. [facility] liaison is aware of above. Patient is aware of above.   [employer], LCSW [phone]

## 2018-10-09 NOTE — Progress Notes (Signed)
Physical Therapy Treatment Patient Details Name: Louis Salazar MRN: [id number] DOB: 11/13/1956 Today's Date: 10/09/2018    History of Present Illness Pt is a 62 y.o. male s/p R TKA secondary degenerative arthrosis 10/07/18.  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    PT Comments    Pt able to progress to ambulating 30 feet with RW CGA and R KI donned.  Vc's required to increase UE support through RW and for R quad set during R LE stance time to improve quality of gait pattern.  UE's fatigued with increased distance ambulating.  Pain 3/10 R knee at rest beginning/end of session but increased to 6/10 with mobility during session.  No c/o dizziness during functional mobility but did report some with R knee ROM ex's (anticipate pain related).  Will continue to progress pt with strengthening, ROM, and progressive ambulation per pt tolerance.    Follow Up Recommendations  SNF     Equipment Recommendations  Rolling walker with 5" wheels;3in1 (PT)    Recommendations for Other Services OT consult     Precautions / Restrictions Precautions Precautions: Knee;Fall Precaution Booklet Issued: Yes (comment) Required Braces or Orthoses: Knee Immobilizer - Right Knee Immobilizer - Right: Discontinue once straight leg raise with < 10 degree lag Restrictions Weight Bearing Restrictions: Yes RLE Weight Bearing: Weight bearing as tolerated    Mobility  Bed Mobility Overal bed mobility: Needs Assistance Bed Mobility: Sit to Supine     Sit to supine: Min assist;HOB elevated   General bed mobility comments: assist for R LE; vc's for use of bed rail  Transfers Overall transfer level: Needs assistance Equipment used: Rolling walker (2 wheeled) Transfers: Sit to/from [facility] Sit to Stand: Min  assist Stand pivot transfers: Min guard;Min assist(stand step turn recliner to bed)       General transfer comment: x3 trials from recliner; intermittent vc's for LE placement; assist to initiate stand  Ambulation/Gait Ambulation/Gait assistance: Min guard Gait Distance (Feet): 30 Feet Assistive device: Rolling walker (2 wheeled)   Gait velocity: decreased   General Gait Details: R KI donned for ambulation; antalgic; decreased stance time R LE; vc's required to increase UE support through RW to offweight R LE and also R quad set during R LE stance time which improved quality of gait towards end of ambulation with repetition/practice   Stairs             Wheelchair Mobility    Modified Rankin (Stroke Patients Only)       Balance Overall balance assessment: Needs assistance Sitting-balance support: No upper extremity supported;Feet supported Sitting balance-Leahy Scale: Good Sitting balance - Comments: steady sitting reaching within BOS   Standing balance support: Single extremity supported Standing balance-Leahy Scale: Good Standing balance comment: steady standing reaching within BOS (increased WB'ing through L LE)                            Cognition Arousal/Alertness: Awake/alert Behavior During Therapy: WFL for tasks assessed/performed Overall Cognitive Status: Within Functional Limits for tasks assessed                                        Exercises Total Joint  Exercises Knee Flexion: AAROM;Right;5 reps;Seated Goniometric ROM (taken in AM session): R knee extension to neutral semi-supine in bed (AAROM); R knee flexion 77 degrees sitting edge of bed (pt using L LE to assist with R knee flexion AAROM) Other Exercises Other Exercises: R knee flexion (in sitting) AAROM with end range hold x15 seconds (x2)    General Comments  Pt agreeable to PT session.  Pt's ex-wife present during session.      Pertinent Vitals/Pain Pain  Assessment: 0-10 Pain Score: 3  Pain Location: R knee Pain Descriptors / Indicators: Sore;Tender Pain Intervention(s): Limited activity within patient's tolerance;Monitored during session;Premedicated before session;Repositioned;Other (comment)(polar care applied and activated)    Home Living                      Prior Function            PT Goals (current goals can now be found in the care plan section) Acute Rehab PT Goals Patient Stated Goal: to get back home PT Goal Formulation: With patient Time For Goal Achievement: 10/22/18 Potential to Achieve Goals: Fair Progress towards PT goals: Progressing toward goals    Frequency    BID      PT Plan Current plan remains appropriate    Co-evaluation              AM-PAC PT "6 Clicks" Daily Activity  Outcome Measure  Difficulty turning over in bed (including adjusting bedclothes, sheets and blankets)?: A Lot Difficulty moving from lying on back to sitting on the side of the bed? : Unable Difficulty sitting down on and standing up from a chair with arms (e.g., wheelchair, bedside commode, etc,.)?: Unable Help needed moving to and from a bed to chair (including a wheelchair)?: A Little Help needed walking in hospital room?: A Little Help needed climbing 3-5 steps with a railing? : Total 6 Click Score: 11    End of Session Equipment Utilized During Treatment: Gait belt;Right knee immobilizer Activity Tolerance: Patient limited by pain Patient left: in bed;with call bell/phone within reach;with bed alarm set;with family/visitor present;with SCD's reapplied;Other (comment)(R heel elevated via bone foam; L heel elevated via towel roll; polar care in place and activated) Nurse Communication: Mobility status;Precautions;Weight bearing status PT Visit Diagnosis: Other abnormalities of gait and mobility (R26.89);Muscle weakness (generalized) (M62.81);Difficulty in walking, not elsewhere classified (R26.2);Pain Pain -  Right/Left: Right Pain - part of body: Knee     Time: 6433-2951 PT Time Calculation (min) (ACUTE ONLY): 39 min  Charges:  $Gait Training: 8-22 mins $Therapeutic Exercise: 8-22 mins $Therapeutic Activity: 8-22 mins                    [name], PT 10/09/18, 2:48 PM [phone]

## 2018-10-09 NOTE — Progress Notes (Signed)
Physical Therapy Treatment Patient Details Name: Louis Salazar MRN: [id number] DOB: [birth year] Today's Date: 10/09/2018    History of Present Illness Pt is a 62 y.o. male s/p R TKA secondary degenerative arthrosis 10/07/18.  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    PT Comments    Pt min assist with bed mobility (for R LE), min assist to stand from mildly elevated bed with RW; and CGA to ambulate 10 feet with RW (close chair follow for safety).  Pt unable to perform R LE SLR without assist so R KI donned for OOB mobility.  Pt reporting "dizziness" with R knee flexion ROM sitting edge of bed and also standing initially (resolved with sitting rest break).  No c/o dizziness with ambulation but distance ambulating limited d/t R knee pain (R knee pain 3/10 at rest beginning of session; 7/10 with ambulation; and 5/10 end of session resting in bed--nurse notified of pt's pain end of session).  R knee flexion to 77 degrees today.  Will continue to progress pt with strengthening, knee ROM, and progressive ambulation distance per pt tolerance.   Follow Up Recommendations  SNF     Equipment Recommendations  Rolling walker with 5" wheels;3in1 (PT)    Recommendations for Other Services OT consult     Precautions / Restrictions Precautions Precautions: Knee;Fall Precaution Booklet Issued: Yes (comment) Required Braces or Orthoses: Knee Immobilizer - Right Knee Immobilizer - Right: Discontinue once straight leg raise with < 10 degree lag Restrictions Weight Bearing Restrictions: Yes RLE Weight Bearing: Weight bearing as tolerated    Mobility  Bed Mobility Overal bed mobility: Needs Assistance Bed Mobility: Supine to Sit;Sit to Supine     Supine to sit: Min assist;HOB elevated Sit to supine: Min assist;HOB elevated   General bed mobility comments: assist for R LE; vc's for use of bed rail  Transfers Overall transfer level: Needs assistance Equipment used: Rolling walker (2 wheeled) Transfers: Sit to/from Stand Sit to Stand: Min assist         General transfer comment: x4 trials from mildly elevated bed height; initial vc's for UE and LE placement; assist to initiate and come to full stand  Ambulation/Gait Ambulation/Gait assistance: Min guard;+2 safety/equipment(close chair follow) Gait Distance (Feet): 10 Feet Assistive device: Rolling walker (2 wheeled)   Gait velocity: decreased   General Gait Details: R KI donned for ambulation; antalgic; decreased stance time R LE; vc's required to increase UE support through RW to offweight R LE and also R quad set during R LE stance time   Stairs             Wheelchair Mobility    Modified Rankin (Stroke Patients Only)       Balance Overall balance assessment: Needs assistance Sitting-balance support: No upper extremity supported;Feet supported Sitting balance-Leahy Scale: Good Sitting balance - Comments: steady sitting reaching within BOS   Standing balance support: Single extremity supported Standing balance-Leahy Scale: Poor Standing balance comment: pt requiring at least single UE support for static standing balance                            Cognition Arousal/Alertness: Awake/alert Behavior During Therapy: WFL for tasks assessed/performed Overall Cognitive Status: Within Functional Limits for tasks assessed  Exercises Total Joint Exercises Ankle Circles/Pumps: AROM;Strengthening;Both;10 reps;Supine Quad Sets: AROM;Strengthening;Both;10 reps;Supine Short Arc Quad: AAROM;Strengthening;Right;10 reps;Supine Heel Slides: AAROM;Strengthening;Right;10 reps;Supine Hip ABduction/ADduction: AAROM;Strengthening;Right;10 reps;Supine Straight Leg Raises:  AAROM;Strengthening;Right;10 reps;Supine Goniometric ROM: R knee extension to neutral semi-supine in bed (AAROM); R knee flexion 77 degrees sitting edge of bed (pt using L LE to assist with R knee flexion AAROM)    General Comments  Pt agreeable to PT session.      Pertinent Vitals/Pain Pain Assessment: 0-10 Pain Score: 5  Pain Location: R knee Pain Descriptors / Indicators: Sore;Tender Pain Intervention(s): Limited activity within patient's tolerance;Monitored during session;Premedicated before session;Repositioned;Other (comment)(polar care applied and activated)  Vitals (HR and O2 on room air) stable and WFL throughout treatment session.    Home Living                      Prior Function            PT Goals (current goals can now be found in the care plan section) Acute Rehab PT Goals Patient Stated Goal: to get back home PT Goal Formulation: With patient Time For Goal Achievement: 10/22/18 Potential to Achieve Goals: Fair Progress towards PT goals: Progressing toward goals    Frequency    BID      PT Plan Current plan remains appropriate    Co-evaluation              AM-PAC PT "6 Clicks" Daily Activity  Outcome Measure  Difficulty turning over in bed (including adjusting bedclothes, sheets and blankets)?: A Lot Difficulty moving from lying on back to sitting on the side of the bed? : Unable Difficulty sitting down on and standing up from a chair with arms (e.g., wheelchair, bedside commode, etc,.)?: Unable Help needed moving to and from a bed to chair (including a wheelchair)?: A Little Help needed walking in hospital room?: Total Help needed climbing 3-5 steps with a railing? : Total 6 Click Score: 9    End of Session Equipment Utilized During Treatment: Gait belt Activity Tolerance: Patient limited by pain Patient left: in chair;with call bell/phone within reach;with chair alarm set;with SCD's reapplied;Other (comment)(B heels elevated via  towel rolls; polar care applied and activated) Nurse Communication: Mobility status;Precautions;Weight bearing status;Other (comment)(pt's pain status and c/o dizziness during session) PT Visit Diagnosis: Other abnormalities of gait and mobility (R26.89);Muscle weakness (generalized) (M62.81);Difficulty in walking, not elsewhere classified (R26.2);Pain Pain - Right/Left: Right Pain - part of body: Knee     Time: 1000-1053 PT Time Calculation (min) (ACUTE ONLY): 53 min  Charges:  $Gait Training: 8-22 mins $Therapeutic Exercise: 23-37 mins $Therapeutic Activity: 8-22 mins                    [name], PT 10/09/18, 11:07 AM [phone]

## 2018-10-09 NOTE — Progress Notes (Signed)
   Subjective: 2 Days Post-Op Procedure(s) (LRB): COMPUTER ASSISTED TOTAL KNEE ARTHROPLASTY (Right) Patient reports pain as 7 on 0-10 scale.   Patient is well, and has had no acute complaints or problems Patient did not do well with therapy yesterday.  Had episodes of dizziness.  Also some hypotension.  Bed to chair transfer.  Range of motion 0 to 74 degrees Complaining of a lot of pain this morning but states that he slept like a baby last night Plan is to go Home after hospital stay. no nausea and no vomiting Patient denies any chest pains or shortness of breath. Patient states that he drinks about 1 case of beer per week  Objective: Vital signs in last 24 hours: Temp:  [98.1 F (36.7 C)-98.6 F (37 C)] 98.3 F (36.8 C) (11/12 2358) Pulse Rate:  [68-90] 69 (11/12 2358) Resp:  [15-18] 15 (11/12 2358) BP: (112-137)/(79-87) 129/82 (11/12 2358) SpO2:  [94 %-99 %] 99 % (11/12 2358) well approximated incision Heels are non tender and elevated off the bed using rolled towels Intake/Output from previous day: 11/12 0701 - 11/13 0700 In: 240 [P.O.:240] Out: 1395 [Urine:925; Drains:470] Intake/Output this shift: Total I/O In: -  Out: 1035 [Urine:925; Drains:110]  No results for input(s): HGB in the last 72 hours. No results for input(s): WBC, RBC, HCT, PLT in the last 72 hours. No results for input(s): NA, K, CL, CO2, BUN, CREATININE, GLUCOSE, CALCIUM in the last 72 hours. No results for input(s): LABPT, INR in the last 72 hours.  EXAM General - Patient is Alert, Appropriate and Oriented Extremity - Neurologically intact Neurovascular intact Sensation intact distally Intact pulses distally Dorsiflexion/Plantar flexion intact No cellulitis present Compartment soft Dressing - dressing C/D/I Motor Function - intact, moving foot and toes well on exam.  Still not able to do straight leg raise on his own but has good quad tone  Past Medical History:  Diagnosis Date  . Arthritis     right shoulder; right knee  . Depression 02/28/2016  . DVT (deep venous thrombosis) ([place]) 01/2018   RIGHT POPLITEAL  . Enlarged prostate   . Foreign body, eye    left eyelid  . Gastritis 10/24/2017  . GERD (gastroesophageal reflux disease)    nissan plication and this has improved  . Hemorrhoid   . History of colon polyps 05/24/2015  . Hypertension    controlled on med  . Neck pain    "EXPERIENCE CRICK IN NECK". comes and goes 2019  . Neuromuscular disorder ([place])    hands are tingly and go numb. possibly carpal tunnel.  right hand  . Pre-diabetes    A1C in 2019 was 6.0  . Substance abuse ([place])    hx of inhalants, last use 2010.  regular  daily alcohol intake (2019)  . Umbilical hernia without obstruction and without gangrene 08/24/2016    Assessment/Plan: 2 Days Post-Op Procedure(s) (LRB): COMPUTER ASSISTED TOTAL KNEE ARTHROPLASTY (Right) Active Problems:   Total knee replacement status  Estimated body mass index is 36.68 kg/m as calculated from the following:   Height as of 09/30/18: 5\' 11"  (1.803 m).   Weight as of 09/30/18: 119.3 kg. Up with therapy Plan for discharge tomorrow  Labs: None DVT Prophylaxis - Aspirin, Foot Pumps, TED hose and Eliquis Weight-Bearing as tolerated to right leg Patient needs a bowel movement Please wash operative leg, change dressing and apply TED stockings to both legs  Brayson Livesey R. Dudley Hissop 10/09/2018, 6:49 AM

## 2018-10-10 NOTE — Progress Notes (Signed)
   Subjective: 3 Days Post-Op Procedure(s) (LRB): COMPUTER ASSISTED TOTAL KNEE ARTHROPLASTY (Right) Patient reports pain as 3 on 0-10 scale.   Patient is well, and has had no acute complaints or problems Patient continues to be very slow with rehab.  Ambulating less than 50 feet.  Range of motion 0 to 77 degrees Plan is to go Rehab after hospital stay. no nausea and no vomiting Patient denies any chest pains or shortness of breath. Appears to be resting comfortably  Objective: Vital signs in last 24 hours: Temp:  [98.3 F (36.8 C)-99.4 F (37.4 C)] 98.3 F (36.8 C) (11/13 2340) Pulse Rate:  [84-91] 84 (11/13 2340) Resp:  [19] 19 (11/13 2340) BP: (114-129)/(63-78) 114/63 (11/13 2340) SpO2:  [96 %-100 %] 96 % (11/13 2340) well approximated incision Heels are non tender and elevated off the bed using rolled towels Intake/Output from previous day: 11/13 0701 - 11/14 0700 In: -  Out: 1350 [Urine:1350] Intake/Output this shift: No intake/output data recorded.  No results for input(s): HGB in the last 72 hours. No results for input(s): WBC, RBC, HCT, PLT in the last 72 hours. No results for input(s): NA, K, CL, CO2, BUN, CREATININE, GLUCOSE, CALCIUM in the last 72 hours. No results for input(s): LABPT, INR in the last 72 hours.  EXAM General - Patient is Alert, Appropriate and Oriented Extremity - Neurologically intact Neurovascular intact Sensation intact distally Intact pulses distally Dorsiflexion/Plantar flexion intact No cellulitis present Compartment soft Dressing - scant drainage Motor Function - intact, moving foot and toes well on exam.  Still not able to do a straight leg raise on his own even though he has good quad tone.  Past Medical History:  Diagnosis Date  . Arthritis    right shoulder; right knee  . Depression 02/28/2016  . DVT (deep venous thrombosis) ([place]) 01/2018   RIGHT POPLITEAL  . Enlarged prostate   . Foreign body, eye    left eyelid  .  Gastritis 10/24/2017  . GERD (gastroesophageal reflux disease)    nissan plication and this has improved  . Hemorrhoid   . History of colon polyps 05/24/2015  . Hypertension    controlled on med  . Neck pain    "EXPERIENCE CRICK IN NECK". comes and goes 2019  . Neuromuscular disorder ([place])    hands are tingly and go numb. possibly carpal tunnel.  right hand  . Pre-diabetes    A1C in 2019 was 6.0  . Substance abuse ([place])    hx of inhalants, last use 2010.  regular  daily alcohol intake (2019)  . Umbilical hernia without obstruction and without gangrene 08/24/2016    Assessment/Plan: 3 Days Post-Op Procedure(s) (LRB): COMPUTER ASSISTED TOTAL KNEE ARTHROPLASTY (Right) Active Problems:   Total knee replacement status  Estimated body mass index is 36.68 kg/m as calculated from the following:   Height as of 09/30/18: 5\' 11"  (1.803 m).   Weight as of 09/30/18: 119.3 kg. Up with therapy Discharge to SNF  Labs: None DVT Prophylaxis - Aspirin, Foot Pumps, TED hose and Eliquis Weight-Bearing as tolerated to right leg Please wash operative leg, change dressing and apply TED stockings to both legs prior to discharge Be sure the bone foam goes with patient  Louis Salazar. [place] Y[place]y 10/10/2018, 7:31 AM

## 2018-10-10 NOTE — Progress Notes (Addendum)
Patient is medically stable for D/C to [facility] today. Per Otila Kluver [facility] liaison [facility] authorization has been received and patient can come today to room 712. RN will call report and arrange EMS for transport. Clinical Education officer, museum (CSW) sent D/C orders to [facility] via [facility]. Patient is aware of above. CSW left patient's sister Linus Orn a Advertising account executive. CSW contacted patient's ex-wife Amy and made her aware of above. Please reconsult if future social work needs arise. CSW signing off.   Patient's sister Linus Orn called CSW back and was made aware of above.   [employer], LCSW [phone]

## 2018-10-10 NOTE — Progress Notes (Signed)
Called EMS for transport. 

## 2018-10-10 NOTE — Progress Notes (Signed)
Physical Therapy Treatment Patient Details Name: Louis Salazar MRN: [id number] DOB: Jul 24, 1956 Today's Date: 10/10/2018    History of Present Illness Pt is a 62 y.o. male s/p R TKA secondary degenerative arthrosis 10/07/18.  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    PT Comments    Pt demonstrating good R quad set but still unable to perform R LE SLR without assist so R KI donned for OOB mobility.  Pt able to progress to ambulating 80 feet with RW CGA although needing to stop multiple times d/t UE fatigue; pt also requiring cueing for gait technique (including increasing UE support through RW and for R quad set during R LE stance phase to improve gait pattern/quality).  R knee ROM 0-90 degrees AAROM.  Will continue to progress pt with strengthening, ROM, and progressive ambulation distance per pt tolerance.    Follow Up Recommendations  SNF     Equipment Recommendations  Rolling walker with 5" wheels;3in1 (PT)    Recommendations for Other Services OT consult     Precautions / Restrictions Precautions Precautions: Knee;Fall Precaution Booklet Issued: Yes (comment) Required Braces or Orthoses: Knee Immobilizer - Right Knee Immobilizer - Right: Discontinue once straight leg raise with < 10 degree lag Restrictions Weight Bearing Restrictions: Yes RLE Weight Bearing: Weight bearing as tolerated    Mobility  Bed Mobility Overal bed mobility: Needs Assistance Bed Mobility: Supine to Sit;Sit to Supine     Supine to sit: Min guard;HOB elevated Sit to supine: Min guard;HOB elevated   General bed mobility comments: pt using L LE to assist with R LE in/out of bed; heavy use of bed rail; vc's for technique  Transfers Overall transfer level: Needs assistance Equipment used: Rolling walker (2  wheeled) Transfers: Sit to/from Stand Sit to Stand: Min assist;From elevated surface         General transfer comment: assist to initiate and come to full stand from elevated bed  Ambulation/Gait Ambulation/Gait assistance: Min guard Gait Distance (Feet): 80 Feet Assistive device: Rolling walker (2 wheeled)   Gait velocity: decreased   General Gait Details: R KI donned for ambulation; antalgic; decreased stance time R LE; vc's required to increase UE support through RW to offweight R LE and also R quad set during R LE stance time to improve quality of gait   Stairs             Wheelchair Mobility    Modified Rankin (Stroke Patients Only)       Balance Overall balance assessment: Needs assistance Sitting-balance support: No upper extremity supported;Feet supported Sitting balance-Leahy Scale: Good Sitting balance - Comments: steady sitting reaching within BOS   Standing balance support: No upper extremity supported Standing balance-Leahy Scale: Good Standing balance comment: steady standing reaching within BOS (increased WB'ing through L LE)                            Cognition Arousal/Alertness: Awake/alert Behavior During Therapy: WFL for tasks assessed/performed Overall Cognitive Status: Within Functional Limits for tasks assessed                                        Exercises Total Joint Exercises Quad Sets:  AROM;Strengthening;Both;10 reps;Supine Heel Slides: AAROM;Strengthening;Right;10 reps;Supine Hip ABduction/ADduction: AAROM;Strengthening;Right;10 reps;Supine Straight Leg Raises: AAROM;Strengthening;Right;10 reps;Supine Goniometric ROM: R knee extension to neutral semi-supine in bed (AAROM); R knee flexion 90 degrees sitting edge of bed (AAROM)    General Comments  Pt agreeable to PT session.      Pertinent Vitals/Pain Pain Assessment: 0-10 Pain Score: 3  Pain Location: R knee Pain Descriptors / Indicators:  Sore;Tender Pain Intervention(s): Limited activity within patient's tolerance;Monitored during session;Premedicated before session;Repositioned;Other (comment)(polar care applied and activated)    Home Living                      Prior Function            PT Goals (current goals can now be found in the care plan section) Acute Rehab PT Goals Patient Stated Goal: to get back home PT Goal Formulation: With patient Time For Goal Achievement: 10/22/18 Potential to Achieve Goals: Fair Progress towards PT goals: Progressing toward goals    Frequency    BID      PT Plan Current plan remains appropriate    Co-evaluation              AM-PAC PT "6 Clicks" Daily Activity  Outcome Measure  Difficulty turning over in bed (including adjusting bedclothes, sheets and blankets)?: A Lot Difficulty moving from lying on back to sitting on the side of the bed? : Unable Difficulty sitting down on and standing up from a chair with arms (e.g., wheelchair, bedside commode, etc,.)?: Unable Help needed moving to and from a bed to chair (including a wheelchair)?: A Little Help needed walking in hospital room?: A Little Help needed climbing 3-5 steps with a railing? : Total 6 Click Score: 11    End of Session Equipment Utilized During Treatment: Gait belt;Right knee immobilizer Activity Tolerance: Patient tolerated treatment well Patient left: in chair;with call bell/phone within reach;with chair alarm set;with SCD's reapplied;Other (comment)(B heels elevated via towel rolls; polar care in place and activated) Nurse Communication: Mobility status;Precautions;Weight bearing status PT Visit Diagnosis: Other abnormalities of gait and mobility (R26.89);Muscle weakness (generalized) (M62.81);Difficulty in walking, not elsewhere classified (R26.2);Pain Pain - Right/Left: Right Pain - part of body: Knee     Time: 3276-1470 PT Time Calculation (min) (ACUTE ONLY): 41 min  Charges:   $Gait Training: 8-22 mins $Therapeutic Exercise: 8-22 mins $Therapeutic Activity: 8-22 mins                    Louis Salazar, PT 10/10/18, 10:43 AM [phone]

## 2018-10-10 NOTE — Clinical Social Work Placement (Addendum)
   CLINICAL SOCIAL WORK PLACEMENT  NOTE  Date:  10/10/2018  Patient Details  Name: Louis Salazar MRN: [id number] Date of Birth: Jul 27, 1956  Clinical Social Work is seeking post-discharge placement for this patient at the [place] level of care (*CSW will initial, date and re-position this form in  chart as items are completed):  Yes   Patient/family provided with [place] Work Department's list of facilities offering this level of care within the geographic area requested by the patient (or if unable, by the patient's family).  Yes   Patient/family informed of their freedom to choose among providers that offer the needed level of care, that participate in Medicare, Medicaid or managed care program needed by the patient, have an available bed and are willing to accept the patient.  Yes   Patient/family informed of [facility]'s ownership interest in [facility] and [facility], as well as of the fact that they are under no obligation to receive care at these facilities.  PASRR submitted to EDS on 10/08/18     PASRR number received on 10/08/18     Existing PASRR number confirmed on       FL2 transmitted to all facilities in geographic area requested by pt/family on 10/08/18     FL2 transmitted to all facilities within larger geographic area on       Patient informed that his/her managed care company has contracts with or will negotiate with certain facilities, including the following:        Yes   Patient/family informed of bed offers received.  Patient chooses bed at ([facility] )     Physician recommends and patient chooses bed at      Patient to be transferred to ([facility] ) on 10/10/18.  Patient to be transferred to facility by [facility] )     Patient family notified on 10/10/18 of transfer.  Name of family member notified:  (Patient's ex-wife Amy is aware of D/C today. CSW left patient's sister Linus Orn a Advertising account executive. )  Patient's sister Linus Orn called CSW back and was made aware of D/C today.   PHYSICIAN       Additional Comment:    _______________________________________________ Daniell Mancinas, [place] Beets, LCSW 10/10/2018, 11:49 AM

## 2018-10-22 ENCOUNTER — Other Ambulatory Visit (INDEPENDENT_AMBULATORY_CARE_PROVIDER_SITE_OTHER): Payer: Self-pay | Admitting: Vascular Surgery

## 2018-10-22 DIAGNOSIS — Z86718 Personal history of other venous thrombosis and embolism: Secondary | ICD-10-CM

## 2018-10-23 ENCOUNTER — Telehealth (INDEPENDENT_AMBULATORY_CARE_PROVIDER_SITE_OTHER): Payer: Self-pay | Admitting: Vascular Surgery

## 2018-10-23 NOTE — Telephone Encounter (Signed)
Called the patient and he is aware of his appt on Monday 10/28/18 and will discuss the IVC filter removal at that time.

## 2018-10-28 ENCOUNTER — Ambulatory Visit (INDEPENDENT_AMBULATORY_CARE_PROVIDER_SITE_OTHER): Payer: BLUE CROSS/BLUE SHIELD | Admitting: Vascular Surgery

## 2018-10-28 ENCOUNTER — Ambulatory Visit (INDEPENDENT_AMBULATORY_CARE_PROVIDER_SITE_OTHER): Payer: BLUE CROSS/BLUE SHIELD

## 2018-10-28 ENCOUNTER — Encounter (INDEPENDENT_AMBULATORY_CARE_PROVIDER_SITE_OTHER): Payer: Self-pay | Admitting: Vascular Surgery

## 2018-10-28 VITALS — BP 120/76 | HR 77 | Resp 16 | Ht 71.0 in | Wt 260.0 lb

## 2018-10-28 DIAGNOSIS — Z96651 Presence of right artificial knee joint: Secondary | ICD-10-CM | POA: Diagnosis not present

## 2018-10-28 DIAGNOSIS — I82431 Acute embolism and thrombosis of right popliteal vein: Secondary | ICD-10-CM | POA: Diagnosis not present

## 2018-10-28 DIAGNOSIS — Z86718 Personal history of other venous thrombosis and embolism: Secondary | ICD-10-CM | POA: Diagnosis not present

## 2018-10-28 DIAGNOSIS — I10 Essential (primary) hypertension: Secondary | ICD-10-CM | POA: Diagnosis not present

## 2018-10-28 DIAGNOSIS — K2950 Unspecified chronic gastritis without bleeding: Secondary | ICD-10-CM | POA: Diagnosis not present

## 2018-10-28 NOTE — Progress Notes (Signed)
MRN : [id number]  Louis Salazar is a 62 y.o. (1956-07-27) male who presents with chief complaint of No chief complaint on file. Marland Kitchen  History of Present Illness:   The patient returns to the office for follow-up status post insertion of an IVC filter prior to orthopedic surgery.  The IVC filter was placed September 24, 6225 without complication   1.   Ultrasound guidance for vascular access to the right common femoral vein 2.   Catheter placement into the inferior vena cava 3.   Inferior venacavogram 4.   Placement of a [place] IVC filter  On October 07, 2018 the patient underwent right total knee replacement.  His course was uncomplicated and he was discharged home on November 12.  Patient notes some swelling at the knee but his ankle and foot have not been involved.  He has pain at the surgery site but denies calf pain.  No abrupt onset of shortness of breath or pleuritic chest pains.  Duplex ultrasound shows normal venous system no evidence of DVT.  No outpatient medications have been marked as taking for the 10/28/18 encounter (Appointment) with Delana Meyer, Dolores Lory, MD.    Past Medical History:  Diagnosis Date  . Arthritis    right shoulder; right knee  . Depression 02/28/2016  . DVT (deep venous thrombosis) ([place]) 01/2018   RIGHT POPLITEAL  . Enlarged prostate   . Foreign body, eye    left eyelid  . Gastritis 10/24/2017  . GERD (gastroesophageal reflux disease)    nissan plication and this has improved  . Hemorrhoid   . History of colon polyps 05/24/2015  . Hypertension    controlled on med  . Neck pain    "EXPERIENCE CRICK IN NECK". comes and goes 2019  . Neuromuscular disorder ([place])    hands are tingly and go numb. possibly carpal tunnel.  right hand  . Pre-diabetes    A1C in 2019 was 6.0  . Substance abuse ([place])    hx of inhalants, last use 2010.  regular  daily alcohol intake (2019)  . Umbilical hernia without obstruction and without gangrene 08/24/2016     Past Surgical History:  Procedure Laterality Date  . ABDOMINAL SURGERY  3335   nissen fundoplication  . BUNIONECTOMY Left 2007  . CARPAL TUNNEL RELEASE Right 03/12/2018   Procedure: CARPAL TUNNEL RELEASE;  Surgeon: Hessie Knows, MD;  Location: [facility];  Service: Orthopedics;  Laterality: Right;  . COLONOSCOPY WITH PROPOFOL N/A 10/18/2015   Procedure: COLONOSCOPY WITH PROPOFOL;  Surgeon: Lucilla Lame, MD;  Location: [place];  Service: Endoscopy;  Laterality: N/A;  . ESOPHAGOGASTRODUODENOSCOPY (EGD) WITH PROPOFOL N/A 10/18/2015   Procedure: ESOPHAGOGASTRODUODENOSCOPY (EGD) WITH PROPOFOL;  Surgeon: Lucilla Lame, MD;  Location: [place];  Service: Endoscopy;  Laterality: N/A;  wants as early as possible  . HERNIA REPAIR    . IVC FILTER INSERTION N/A 09/24/2018   Procedure: IVC FILTER INSERTION;  Surgeon: [name], MD;  Location: [facility];  Service: Cardiovascular;  Laterality: N/A;  . KNEE ARTHROPLASTY Right 10/07/2018   Procedure: COMPUTER ASSISTED TOTAL KNEE ARTHROPLASTY;  Surgeon: Dereck Leep, MD;  Location: [facility];  Service: Orthopedics;  Laterality: Right;  . KNEE ARTHROSCOPY Right 02/18/2018   Procedure: ARTHROSCOPY KNEE;  Surgeon: Dereck Leep, MD;  Location: [facility];  Service: Orthopedics;  Laterality: Right;  . KNEE ARTHROSCOPY WITH LATERAL MENISECTOMY Right 02/18/2018   Procedure: KNEE ARTHROSCOPY WITH LATERAL MENISECTOMY;  Surgeon: Marry Guan,  Laurice Record, MD;  Location: [facility];  Service: Orthopedics;  Laterality: Right;  . KNEE ARTHROSCOPY WITH MEDIAL MENISECTOMY Right 02/18/2018   Procedure: KNEE ARTHROSCOPY WITH MEDIAL MENISECTOMY;  Surgeon: Dereck Leep, MD;  Location: [facility];  Service: Orthopedics;  Laterality: Right;  . NISSEN FUNDOPLICATION  1610  . SHOULDER SURGERY  04/2016   Right shoulder replacement  . UMBILICAL HERNIA REPAIR N/A 09/12/2016   Procedure: HERNIA REPAIR UMBILICAL ADULT;  Surgeon: Robert Bellow, MD;   Location: [facility];  Service: General;  Laterality: N/A;  . WRIST SURGERY Left 1990   bones fused. metal in wrist    Social History Social History   Tobacco Use  . Smoking status: Former Smoker    Packs/day: 1.00    Years: 30.00    Pack years: 30.00    Types: Cigarettes    Last attempt to quit: 11/28/1995    Years since quitting: 22.9  . Smokeless tobacco: Current User    Types: Snuff  . Tobacco comment: 1 can of chew in about 3 days.last chew was 9:30 am day of surgery  Substance Use Topics  . Alcohol use: Yes    Alcohol/week: 3.0 standard drinks    Types: 3 Cans of beer per week    Comment: 3 BEERS QD or shots of liquor  . Drug use: No    Comment: PT WITH H/O INHALENTS-LAST USED 2010    Family History Family History  Problem Relation Age of Onset  . Cancer Other   . COPD Mother   . Arthritis Maternal Grandmother   . Cancer Maternal Grandmother   . Diabetes Maternal Grandmother     Allergies  Allergen Reactions  . Celebrex [Celecoxib] Hives and Rash  . Sulfa Antibiotics Other (See Comments)    Unknown - childhood allergy     REVIEW OF SYSTEMS (Negative unless checked)  Constitutional: [] Weight loss  [] Fever  [] Chills Cardiac: [] Chest pain   [] Chest pressure   [] Palpitations   [] Shortness of breath when laying flat   [] Shortness of breath with exertion. Vascular:  [] Pain in legs with walking   [] Pain in legs at rest  [x] History of DVT   [] Phlebitis   [] Swelling in legs   [] Varicose veins   [] Non-healing ulcers Pulmonary:   [] Uses home oxygen   [] Productive cough   [] Hemoptysis   [] Wheeze  [] COPD   [] Asthma Neurologic:  [] Dizziness   [] Seizures   [] History of stroke   [] History of TIA  [] Aphasia   [] Vissual changes   [] Weakness or numbness in arm   [] Weakness or numbness in leg Musculoskeletal:   [] Joint swelling   [x] Joint pain   [] Low back pain Hematologic:  [] Easy bruising  [] Easy bleeding   [] Hypercoagulable state   [] Anemic Gastrointestinal:  [] Diarrhea    [] Vomiting  [] Gastroesophageal reflux/heartburn   [] Difficulty swallowing. Genitourinary:  [] Chronic kidney disease   [] Difficult urination  [] Frequent urination   [] Blood in urine Skin:  [] Rashes   [] Ulcers  Psychological:  [] History of anxiety   []  History of major depression.  Physical Examination  There were no vitals filed for this visit. There is no height or weight on file to calculate BMI. Gen: WD/WN, NAD Head: [place]/AT, No temporalis wasting.  Ear/Nose/Throat: Hearing grossly intact, nares w/o erythema or drainage Eyes: PER, EOMI, sclera nonicteric.  Neck: Supple, no large masses.   Pulmonary:  Good air movement, no audible wheezing bilaterally, no use of accessory muscles.  Cardiac: RRR, no JVD Vascular: Right knee with  incision consistent with total knee replacement appears clean dry and intact and healing well.  There is moderate edema of the knee.  There is trace edema of the ankle.  Left foot is unaffected. Vessel Right Left  Radial Palpable Palpable  PT Palpable Palpable  DP Palpable Palpable  Gastrointestinal: Non-distended. No guarding/no peritoneal signs.  Musculoskeletal: M/S 5/5 throughout.  Status post recent total knee replacement right side.  Neurologic: CN 2-12 intact. Symmetrical.  Speech is fluent. Motor exam as listed above. Psychiatric: Judgment intact, Mood & affect appropriate for pt's clinical situation. Dermatologic: No rashes or ulcers noted.  No changes consistent with cellulitis. Lymph : No lichenification or skin changes of chronic lymphedema.  CBC Lab Results  Component Value Date   WBC 7.6 09/25/2018   HGB 15.3 09/25/2018   HCT 48.8 09/25/2018   MCV 88.2 09/25/2018   PLT 208 09/25/2018    BMET    Component Value Date/Time   NA 141 09/25/2018 0836   NA 146 (H) 02/15/2016 0844   K 4.3 09/25/2018 0836   CL 109 09/25/2018 0836   CO2 25 09/25/2018 0836   GLUCOSE 112 (H) 09/25/2018 0836   BUN 22 09/25/2018 0836   BUN 26 (H) 02/15/2016 0844     CREATININE 0.91 09/25/2018 0836   CALCIUM 8.7 (L) 09/25/2018 0836   GFRNONAA >60 09/25/2018 0836   GFRAA >60 09/25/2018 0836   CrCl cannot be calculated (Patient's most recent lab result is older than the maximum 21 days allowed.).  COAG Lab Results  Component Value Date   INR 1.05 09/25/2018   INR 0.95 03/23/2018    Radiology Dg Knee Right Port  Result Date: 10/07/2018 CLINICAL DATA:  Knee replacement EXAM: PORTABLE RIGHT KNEE - 1-2 VIEW COMPARISON:  CT 12/21/2017 FINDINGS: Status post knee replacement with normal alignment and intact hardware. Gas within the soft tissues consistent with recent surgery. Surgical drain in the suprapatellar region. IMPRESSION: Status post right knee replacement with expected postsurgical changes Electronically Signed   By: Donavan Foil M.D.   On: 10/07/2018 19:24   Vas [place] Lower Extremity Venous (dvt)  Result Date: 09/30/2018  Lower Venous Study Other Indications: IVC filter placed 09/24/2018 for preop Right knee surgery. Comparison Study: 03/23/2018 Performing Technologist: Concha Norway RVT  Examination Guidelines: A complete evaluation includes B-mode imaging, spectral Doppler, color Doppler, and power Doppler as needed of all accessible portions of each vessel. Bilateral testing is considered an integral part of a complete examination. Limited examinations for reoccurring indications may be performed as noted.  Right Venous Findings: ++---------------+---------+-----------+----------+-------+ CompressibilityPhasicitySpontaneityPropertiesSummary ++---------------+---------+-----------+----------+-------+ Full           Yes      Yes                          ++---------------+---------+-----------+----------+-------+    Summary: Right: There is no evidence of deep vein thrombosis in the lower extremity.There is no evidence of superficial venous thrombosis. Left: No evidence of common femoral vein obstruction.  *See table(s) above for measurements  and observations. Electronically signed by Hortencia Pilar MD on 09/30/2018 at 4:55:51 PM.    Final       Assessment/Plan 1. Acute deep vein thrombosis (DVT) of popliteal vein of right lower extremity (HCC) The patient will continue anticoagulation for now as there have not been any problems or complications at this point.  IVC filter is strongly recommended to be removed.   Risk and benefits were reviewed the  patient.  Indications for the procedure were reviewed.  All questions were answered, the patient agrees to proceed.   I have reminded the patient regarding DVT and post phlebitic changes such as swelling and why it  causes symptoms such as pain.  The patient will wear graduated compression stockings class 1 (20-30 mmHg) on a daily basis a prescription was given. The patient will  beginning wearing the stockings first thing in the morning and removing them in the evening. The patient is instructed specifically not to sleep in the stockings.  In addition, behavioral modification including elevation during the day and avoidance of prolonged dependency will be initiated.    The patient will follow-up with me i after IVC filter removal  2. Status post total right knee replacement Appears to be healing well continue physical therapy  3. Essential (primary) hypertension Continue antihypertensive medications as already ordered, these medications have been reviewed and there are no changes at this time.   4. Chronic gastritis without bleeding, unspecified gastritis type Continue PPI as already ordered, this medication has been reviewed and there are no changes at this time.  Avoidence of caffeine and alcohol  Moderate elevation of the head of the bed    Hortencia Pilar, MD  10/28/2018 8:37 AM

## 2018-10-29 ENCOUNTER — Encounter (INDEPENDENT_AMBULATORY_CARE_PROVIDER_SITE_OTHER): Payer: Self-pay

## 2018-10-30 ENCOUNTER — Encounter (INDEPENDENT_AMBULATORY_CARE_PROVIDER_SITE_OTHER): Payer: Self-pay

## 2018-11-07 ENCOUNTER — Encounter (INDEPENDENT_AMBULATORY_CARE_PROVIDER_SITE_OTHER): Payer: Self-pay

## 2018-11-12 ENCOUNTER — Other Ambulatory Visit (INDEPENDENT_AMBULATORY_CARE_PROVIDER_SITE_OTHER): Payer: Self-pay | Admitting: Nurse Practitioner

## 2018-11-12 ENCOUNTER — Ambulatory Visit
Admission: RE | Admit: 2018-11-12 | Discharge: 2018-11-12 | Disposition: A | Discharge status: Home / Self Care | Payer: BLUE CROSS/BLUE SHIELD | Attending: Vascular Surgery | Admitting: Vascular Surgery

## 2018-11-12 ENCOUNTER — Encounter
Admission: RE | Disposition: A | Discharge status: Home / Self Care | Payer: Self-pay | Source: Home / Self Care | Attending: Vascular Surgery

## 2018-11-12 ENCOUNTER — Other Ambulatory Visit: Payer: Self-pay

## 2018-11-12 ENCOUNTER — Encounter: Payer: Self-pay | Admitting: *Deleted

## 2018-11-12 DIAGNOSIS — Z888 Allergy status to other drugs, medicaments and biological substances status: Secondary | ICD-10-CM | POA: Insufficient documentation

## 2018-11-12 DIAGNOSIS — I10 Essential (primary) hypertension: Secondary | ICD-10-CM | POA: Diagnosis not present

## 2018-11-12 DIAGNOSIS — I82431 Acute embolism and thrombosis of right popliteal vein: Secondary | ICD-10-CM | POA: Insufficient documentation

## 2018-11-12 DIAGNOSIS — Z96651 Presence of right artificial knee joint: Secondary | ICD-10-CM | POA: Insufficient documentation

## 2018-11-12 DIAGNOSIS — Z882 Allergy status to sulfonamides status: Secondary | ICD-10-CM | POA: Insufficient documentation

## 2018-11-12 DIAGNOSIS — Z95828 Presence of other vascular implants and grafts: Secondary | ICD-10-CM | POA: Diagnosis not present

## 2018-11-12 DIAGNOSIS — M1990 Unspecified osteoarthritis, unspecified site: Secondary | ICD-10-CM | POA: Insufficient documentation

## 2018-11-12 DIAGNOSIS — K219 Gastro-esophageal reflux disease without esophagitis: Secondary | ICD-10-CM | POA: Diagnosis not present

## 2018-11-12 DIAGNOSIS — Z833 Family history of diabetes mellitus: Secondary | ICD-10-CM | POA: Insufficient documentation

## 2018-11-12 DIAGNOSIS — Z8261 Family history of arthritis: Secondary | ICD-10-CM | POA: Diagnosis not present

## 2018-11-12 DIAGNOSIS — K2950 Unspecified chronic gastritis without bleeding: Secondary | ICD-10-CM | POA: Diagnosis not present

## 2018-11-12 DIAGNOSIS — R7303 Prediabetes: Secondary | ICD-10-CM | POA: Diagnosis not present

## 2018-11-12 DIAGNOSIS — Z87891 Personal history of nicotine dependence: Secondary | ICD-10-CM | POA: Diagnosis not present

## 2018-11-12 DIAGNOSIS — I82409 Acute embolism and thrombosis of unspecified deep veins of unspecified lower extremity: Secondary | ICD-10-CM | POA: Diagnosis not present

## 2018-11-12 DIAGNOSIS — I2699 Other pulmonary embolism without acute cor pulmonale: Secondary | ICD-10-CM | POA: Diagnosis not present

## 2018-11-12 DIAGNOSIS — I70229 Atherosclerosis of native arteries of extremities with rest pain, unspecified extremity: Secondary | ICD-10-CM

## 2018-11-12 HISTORY — PX: IVC FILTER REMOVAL: CATH118246

## 2018-11-12 SURGERY — IVC FILTER REMOVAL
Anesthesia: Moderate Sedation

## 2018-11-12 MED ORDER — DEXTROSE 5 % IV SOLN
2.0000 g | Freq: Once | INTRAVENOUS | Status: DC
  Filled 2018-11-12 (×2): qty 20

## 2018-11-12 MED ORDER — FENTANYL CITRATE (PF) 100 MCG/2ML IJ SOLN
INTRAMUSCULAR | Status: DC | PRN
  Administered 2018-11-12 (×3): 50 ug via INTRAVENOUS

## 2018-11-12 MED ORDER — CEFAZOLIN SODIUM-DEXTROSE 2-4 GM/100ML-% IV SOLN
INTRAVENOUS | Status: AC
  Administered 2018-11-12: 2 g via INTRAVENOUS
  Filled 2018-11-12: qty 100

## 2018-11-12 MED ORDER — MIDAZOLAM HCL 5 MG/5ML IJ SOLN
INTRAMUSCULAR | Status: AC
  Filled 2018-11-12: qty 5

## 2018-11-12 MED ORDER — MIDAZOLAM HCL 2 MG/ML PO SYRP
ORAL_SOLUTION | ORAL | Status: AC
  Filled 2018-11-12: qty 4

## 2018-11-12 MED ORDER — FENTANYL CITRATE (PF) 100 MCG/2ML IJ SOLN
INTRAMUSCULAR | Status: AC
  Filled 2018-11-12: qty 2

## 2018-11-12 MED ORDER — MIDAZOLAM HCL 2 MG/ML PO SYRP
10.0000 mg | ORAL_SOLUTION | Freq: Once | ORAL | Status: AC
  Administered 2018-11-12: 8 mg via ORAL

## 2018-11-12 MED ORDER — SODIUM CHLORIDE 0.9 % IV SOLN
INTRAVENOUS | Status: DC
  Administered 2018-11-12: 13:00:00 via INTRAVENOUS

## 2018-11-12 MED ORDER — MIDAZOLAM HCL 2 MG/2ML IJ SOLN
INTRAMUSCULAR | Status: DC | PRN
  Administered 2018-11-12: 2 mg via INTRAVENOUS
  Administered 2018-11-12: 1 mg via INTRAVENOUS

## 2018-11-12 MED ORDER — IOPAMIDOL (ISOVUE-300) INJECTION 61%
INTRAVENOUS | Status: DC | PRN
  Administered 2018-11-12: 15 mL via INTRAVENOUS

## 2018-11-12 MED ORDER — HEPARIN (PORCINE) IN NACL 1000-0.9 UT/500ML-% IV SOLN
INTRAVENOUS | Status: AC
  Filled 2018-11-12: qty 500

## 2018-11-12 MED ORDER — LIDOCAINE-EPINEPHRINE (PF) 1 %-1:200000 IJ SOLN
INTRAMUSCULAR | Status: AC
  Filled 2018-11-12: qty 10

## 2018-11-12 MED ORDER — CEFAZOLIN SODIUM-DEXTROSE 2-4 GM/100ML-% IV SOLN
2.0000 g | Freq: Three times a day (TID) | INTRAVENOUS | Status: DC
  Administered 2018-11-12: 2 g via INTRAVENOUS

## 2018-11-12 MED ORDER — HYDROMORPHONE HCL 1 MG/ML IJ SOLN: 1.0000 mg | Freq: Once | INTRAMUSCULAR | Status: DC | PRN

## 2018-11-12 MED ORDER — ONDANSETRON HCL 4 MG/2ML IJ SOLN: 4.0000 mg | Freq: Four times a day (QID) | INTRAMUSCULAR | Status: DC | PRN

## 2018-11-12 SURGICAL SUPPLY — 6 items
NDL ENTRY 21GA 7CM ECHOTIP (NEEDLE) IMPLANT
NEEDLE ENTRY 21GA 7CM ECHOTIP (NEEDLE) ×3 IMPLANT
PACK ANGIOGRAPHY (CUSTOM PROCEDURE TRAY) ×2 IMPLANT
SET INTRO CAPELLA COAXIAL (SET/KITS/TRAYS/PACK) ×2 IMPLANT
SET VENACAVA FILTER RETRIEVAL (MISCELLANEOUS) ×2 IMPLANT
WIRE J 3MM .035X145CM (WIRE) ×4 IMPLANT

## 2018-11-12 NOTE — H&P (Signed)
[facility] History & Physical Update  The patient was interviewed and re-examined.  The patient's previous History and Physical has been reviewed and is unchanged.  There is no change in the plan of care. We plan to proceed with the scheduled procedure.  Hortencia Pilar, MD  11/12/2018, 1:33 PM

## 2018-11-12 NOTE — Op Note (Signed)
  OPERATIVE NOTE   PRE-OPERATIVE DIAGNOSIS: DVT with PE  POST-OPERATIVE DIAGNOSIS: Same  PROCEDURE: 1. Retrieval of IVC Filter 2. Inferior Vena Cavagram  SURGEON: [name], M.D.  ANESTHESIA:  Conscious sedation was administered under my direct supervision by the interventional radiology RN. IV Versed plus fentanyl were utilized. Continuous ECG, pulse oximetry and blood pressure was monitored throughout the entire procedure. Conscious sedation was for a total of 25 minutes.  ESTIMATED BLOOD LOSS: Minimal cc  FINDING(S):inferior vena cava is widely patent filter is in place in good position. Filter is removed without incident  SPECIMEN(S):  IVC filter intact  INDICATIONS:   Louis Salazar is a 62 y.o. male who presents with DVT and PE. The patient has now tolerated anticoagulation for several months. Therefore, the IVC filter is recommended to be removed. The risks and benefits were reviewed with the patient all questions were answered and they agreed to proceed with IVC filter retrieval. Oral anticoagulation will be continued.  DESCRIPTION: After obtaining full informed written consent, the patient was brought back to the Special Procedure Suite and placed in the supine position.  The patient received IV antibiotics prior to induction.  After obtaining adequate sedation, the patient was prepped and draped in the standard fashion and appropriate time out is called.     Ultrasound was placed in a sterile sleeve.The right neck was then imaged with ultrasound.   Jugular vein was identified it is echolucent and homogeneous indicating patency. 1% lidocaine is then infiltrated under ultrasound visualization and subsequently a Seldinger needle is inserted under real-time ultrasound guidance.  J-wire is then advanced into the inferior vena cava under fluoroscopic guidance. With the tip of the sheath at the confluence of the iliac veins inferior vena caval imaging is performed.  After  review of the image the sheath is repositioned to above the filter and the snares introduced. Snares opened and the hook is secured without difficulty. The filter is then collapsed within the sheath and removed without difficulty.  Sheath is removed by pressures held the patient tolerated the procedure well and there were no immediate complications.  Interpretation: inferior vena cava is widely patent filter is in place in good position. Filter is removed without incident.     COMPLICATIONS: None  CONDITION: Carlynn Purl, M.D. [facility] Office: [phone]   11/12/2018, 3:45 PM

## 2018-11-12 NOTE — Discharge Instructions (Signed)
Inferior Vena Cava Filter Removal, Care After This sheet gives you information about how to care for yourself after your procedure. Your health care provider may also give you more specific instructions. If you have problems or questions, contact your health care provider. What can I expect after the procedure? After your procedure, it is common to have: Mild pain in the area where the filter was removed. Mild bruising in the area where the filter was removed.  Follow these instructions at home: Removal  site care Follow instructions from your health care provider about how to take care of the site where a catheter was removed at your neck.. Make sure you: Wash your hands with soap and water before you change your bandage (dressing). If soap and water are not available, use hand sanitizer. Leave bandage on for 48hrs.. Check your removal site every day for signs of infection. Check for: More redness, swelling, or pain. More fluid or blood. Warmth. Pus or a bad smell. Keep the removal site clean and dry. You may shower tomorrow, do not scrub or rub over the dressing site. General instructions Take over-the-counter and prescription medicines only as told by your health care provider. Avoid heavy lifting or hard activities for 48 hours after the procedure or as told by your health care provider. Do not drive for 24 hours if you were given a a medicine to help you relax (sedative). Do not drive or use heavy machinery while taking prescription pain medicine. Do not go back to school or work until your health care provider approves. Keep all follow-up visits as told by your health care provider. This is important. Contact a health care provider if: You have more redness, swelling, or pain around your removal site. You have more fluid or blood coming from you removalr site. Your removal site feels warm to the touch. You have pus or a bad smell coming from your  removal site. You have a  fever. You are dizzy. You have nausea and vomiting. You develop a rash. Get help right away if: You develop chest pain, a cough, or difficulty breathing. You develop shortness of breath, feel faint, or pass out. You cough up blood. You have severe pain in your abdomen. You develop swelling and discoloration or pain in your legs. Your legs become pale and cold or blue. You develop weakness, difficulty moving your arms or legs, or balance problems. You develop problems with speech or vision. These symptoms may represent a serious problem that is an emergency. Do not wait to see if the symptoms will go away. Get medical help right away. Call your local emergency services (911 in the [place] Do not drive yourself to the hospital. Summary   

## 2018-11-13 ENCOUNTER — Encounter: Payer: Self-pay | Admitting: Vascular Surgery

## 2018-11-28 ENCOUNTER — Encounter (INDEPENDENT_AMBULATORY_CARE_PROVIDER_SITE_OTHER): Payer: Self-pay | Admitting: Vascular Surgery

## 2018-11-28 ENCOUNTER — Ambulatory Visit (INDEPENDENT_AMBULATORY_CARE_PROVIDER_SITE_OTHER): Payer: BLUE CROSS/BLUE SHIELD | Admitting: Vascular Surgery

## 2018-11-28 VITALS — BP 127/82 | HR 80 | Resp 18 | Ht 71.0 in | Wt 263.0 lb

## 2018-11-28 DIAGNOSIS — M150 Primary generalized (osteo)arthritis: Secondary | ICD-10-CM | POA: Diagnosis not present

## 2018-11-28 DIAGNOSIS — F17220 Nicotine dependence, chewing tobacco, uncomplicated: Secondary | ICD-10-CM

## 2018-11-28 DIAGNOSIS — I82431 Acute embolism and thrombosis of right popliteal vein: Secondary | ICD-10-CM

## 2018-11-28 DIAGNOSIS — M159 Polyosteoarthritis, unspecified: Secondary | ICD-10-CM

## 2018-11-28 DIAGNOSIS — I10 Essential (primary) hypertension: Secondary | ICD-10-CM

## 2018-11-28 NOTE — Progress Notes (Signed)
MRN : [id number]  Louis Salazar is a 63 y.o. (1956-11-25) male who presents with chief complaint of  Chief Complaint  Patient presents with  . Follow-up  .  History of Present Illness:   The patient returns status post IVC filter retrieval.  There were no complications with the procedure.  He denies neck pain or any troubles at the site.  He denies leg swelling.  He is status post right total knee replacement October 07, 2018.  He is done well with his knee replacement.  He is asking when he will stop his Eliquis.  Current Meds  Medication Sig  . acetaminophen (TYLENOL) 650 MG CR tablet Take 1,300 mg by mouth every 8 (eight) hours as needed for pain.  Marland Kitchen amoxicillin (AMOXIL) 500 MG capsule Take 2,000 mg by mouth See admin instructions. Take 2000 mg 1 hour prior to dental work  . clomiPHENE (CLOMID) 50 MG tablet Take 25 mg by mouth daily.   Marland Kitchen ELIQUIS 5 MG TABS tablet TAKE 1 TABLET BY MOUTH TWICE DAILY  . finasteride (PROSCAR) 5 MG tablet Take 5 mg by mouth at bedtime.   Marland Kitchen loperamide (IMODIUM) 2 MG capsule Take 2 mg by mouth daily.  . metoprolol succinate (TOPROL-XL) 25 MG 24 hr tablet Take 1 tablet (25 mg total) by mouth 2 (two) times daily.  . tamsulosin (FLOMAX) 0.4 MG CAPS capsule Take 0.4 mg by mouth at bedtime.   . temazepam (RESTORIL) 30 MG capsule Take 30 mg by mouth at bedtime.   . traMADol (ULTRAM) 50 MG tablet Take 1-2 tablets (50-100 mg total) by mouth every 4 (four) hours as needed for moderate pain.    Past Medical History:  Diagnosis Date  . Arthritis    right shoulder; right knee  . Depression 02/28/2016  . DVT (deep venous thrombosis) ([place]) 01/2018   RIGHT POPLITEAL  . Enlarged prostate   . Foreign body, eye    left eyelid  . Gastritis 10/24/2017  . GERD (gastroesophageal reflux disease)    nissan plication and this has improved  . Hemorrhoid   . History of colon polyps 05/24/2015  . Hypertension    controlled on med  . Neck pain    "EXPERIENCE  CRICK IN NECK". comes and goes 2019  . Neuromuscular disorder ([place])    hands are tingly and go numb. possibly carpal tunnel.  right hand  . Pre-diabetes    A1C in 2019 was 6.0  . Substance abuse ([place])    hx of inhalants, last use 2010.  regular  daily alcohol intake (2019)  . Umbilical hernia without obstruction and without gangrene 08/24/2016    Past Surgical History:  Procedure Laterality Date  . ABDOMINAL SURGERY  3559   nissen fundoplication  . BUNIONECTOMY Left 2007  . CARPAL TUNNEL RELEASE Right 03/12/2018   Procedure: CARPAL TUNNEL RELEASE;  Surgeon: Hessie Knows, MD;  Location: [facility];  Service: Orthopedics;  Laterality: Right;  . COLONOSCOPY WITH PROPOFOL N/A 10/18/2015   Procedure: COLONOSCOPY WITH PROPOFOL;  Surgeon: Lucilla Lame, MD;  Location: [place];  Service: Endoscopy;  Laterality: N/A;  . ESOPHAGOGASTRODUODENOSCOPY (EGD) WITH PROPOFOL N/A 10/18/2015   Procedure: ESOPHAGOGASTRODUODENOSCOPY (EGD) WITH PROPOFOL;  Surgeon: Lucilla Lame, MD;  Location: [place];  Service: Endoscopy;  Laterality: N/A;  wants as early as possible  . HERNIA REPAIR    . IVC FILTER INSERTION N/A 09/24/2018   Procedure: IVC FILTER INSERTION;  Surgeon: [name], MD;  Location:  [place] [facility];  Service: Cardiovascular;  Laterality: N/A;  . IVC FILTER REMOVAL N/A 11/12/2018   Procedure: IVC FILTER REMOVAL;  Surgeon: [name], MD;  Location: [place] [facility];  Service: Cardiovascular;  Laterality: N/A;  . KNEE ARTHROPLASTY Right 10/07/2018   Procedure: COMPUTER ASSISTED TOTAL KNEE ARTHROPLASTY;  Surgeon: Dereck Leep, MD;  Location: [facility];  Service: Orthopedics;  Laterality: Right;  . KNEE ARTHROSCOPY Right 02/18/2018   Procedure: ARTHROSCOPY KNEE;  Surgeon: Dereck Leep, MD;  Location: [facility];  Service: Orthopedics;  Laterality: Right;  . KNEE ARTHROSCOPY WITH LATERAL MENISECTOMY Right 02/18/2018   Procedure: KNEE ARTHROSCOPY WITH  LATERAL MENISECTOMY;  Surgeon: Dereck Leep, MD;  Location: [facility];  Service: Orthopedics;  Laterality: Right;  . KNEE ARTHROSCOPY WITH MEDIAL MENISECTOMY Right 02/18/2018   Procedure: KNEE ARTHROSCOPY WITH MEDIAL MENISECTOMY;  Surgeon: Dereck Leep, MD;  Location: [facility];  Service: Orthopedics;  Laterality: Right;  . NISSEN FUNDOPLICATION  4098  . SHOULDER SURGERY  04/2016   Right shoulder replacement  . UMBILICAL HERNIA REPAIR N/A 09/12/2016   Procedure: HERNIA REPAIR UMBILICAL ADULT;  Surgeon: Robert Bellow, MD;  Location: [facility];  Service: General;  Laterality: N/A;  . WRIST SURGERY Left 1990   bones fused. metal in wrist    Social History Social History   Tobacco Use  . Smoking status: Former Smoker    Packs/day: 1.00    Years: 30.00    Pack years: 30.00    Types: Cigarettes    Last attempt to quit: 11/28/1995    Years since quitting: 23.0  . Smokeless tobacco: Current User    Types: Snuff  . Tobacco comment: 1 can of chew in about 3 days.last chew was 9:30 am day of surgery  Substance Use Topics  . Alcohol use: Yes    Alcohol/week: 24.0 standard drinks    Types: 24 Cans of beer per week    Comment: 24 beers per week  . Drug use: No    Comment: PT WITH H/O INHALENTS-LAST USED 2010    Family History Family History  Problem Relation Age of Onset  . Cancer Other   . COPD Mother   . Arthritis Maternal Grandmother   . Cancer Maternal Grandmother   . Diabetes Maternal Grandmother     Allergies  Allergen Reactions  . Celebrex [Celecoxib] Hives and Rash  . Sulfa Antibiotics Other (See Comments)    Unknown - childhood allergy     REVIEW OF SYSTEMS (Negative unless checked)  Constitutional: [] Weight loss  [] Fever  [] Chills Cardiac: [] Chest pain   [] Chest pressure   [] Palpitations   [] Shortness of breath when laying flat   [] Shortness of breath with exertion. Vascular:  [] Pain in legs with walking   [] Pain in legs at rest  [x] History of DVT    [] Phlebitis   [] Swelling in legs   [] Varicose veins   [] Non-healing ulcers Pulmonary:   [] Uses home oxygen   [] Productive cough   [] Hemoptysis   [] Wheeze  [] COPD   [] Asthma Neurologic:  [] Dizziness   [] Seizures   [] History of stroke   [] History of TIA  [] Aphasia   [] Vissual changes   [] Weakness or numbness in arm   [] Weakness or numbness in leg Musculoskeletal:   [x] Joint swelling   [x] Joint pain   [] Low back pain Hematologic:  [] Easy bruising  [] Easy bleeding   [] Hypercoagulable state   [] Anemic Gastrointestinal:  [] Diarrhea   [] Vomiting  [] Gastroesophageal reflux/heartburn   []   Difficulty swallowing. Genitourinary:  [] Chronic kidney disease   [] Difficult urination  [] Frequent urination   [] Blood in urine Skin:  [] Rashes   [] Ulcers  Psychological:  [] History of anxiety   []  History of major depression.  Physical Examination  Vitals:   11/28/18 0835  BP: 127/82  Pulse: 80  Resp: 18  Weight: 263 lb (119.3 kg)  Height: 5\' 11"  (1.803 m)   Body mass index is 36.68 kg/m. Gen: WD/WN, NAD Head: [place]/AT, No temporalis wasting.  Ear/Nose/Throat: Hearing grossly intact, nares w/o erythema or drainage Eyes: PER, EOMI, sclera nonicteric.  Neck: Supple, no large masses.   Pulmonary:  Good air movement, no audible wheezing bilaterally, no use of accessory muscles.  Cardiac: RRR, no JVD Vascular: Right leg with TED hose total knee incision is clean dry and intact healed quite nicely.  There is minimal swelling at the level of the ankle there are normal postoperative changes at the level of the knee Vessel Right Left  Radial Palpable Palpable  PT Palpable Palpable  DP Palpable Palpable  Gastrointestinal: Non-distended. No guarding/no peritoneal signs.  Musculoskeletal: M/S 5/5 throughout.  No deformity or atrophy.  Neurologic: CN 2-12 intact. Symmetrical.  Speech is fluent. Motor exam as listed above. Psychiatric: Judgment intact, Mood & affect appropriate for pt's clinical  situation. Dermatologic: No rashes or ulcers noted.  No changes consistent with cellulitis. Lymph : No lichenification or skin changes of chronic lymphedema.  CBC Lab Results  Component Value Date   WBC 7.6 09/25/2018   HGB 15.3 09/25/2018   HCT 48.8 09/25/2018   MCV 88.2 09/25/2018   PLT 208 09/25/2018    BMET    Component Value Date/Time   NA 141 09/25/2018 0836   NA 146 (H) 02/15/2016 0844   K 4.3 09/25/2018 0836   CL 109 09/25/2018 0836   CO2 25 09/25/2018 0836   GLUCOSE 112 (H) 09/25/2018 0836   BUN 22 09/25/2018 0836   BUN 26 (H) 02/15/2016 0844   CREATININE 0.91 09/25/2018 0836   CALCIUM 8.7 (L) 09/25/2018 0836   GFRNONAA >60 09/25/2018 0836   GFRAA >60 09/25/2018 0836   CrCl cannot be calculated (Patient's most recent lab result is older than the maximum 21 days allowed.).  COAG Lab Results  Component Value Date   INR 1.05 09/25/2018   INR 0.95 03/23/2018    Radiology No results found.    Assessment/Plan 1. Acute deep vein thrombosis (DVT) of popliteal vein of right lower extremity ([place]) He is s/p successful IVC filter removal.  He can stop his Eliquis on Jan 07, 2019  He will follow up PRN  2. Essential (primary) hypertension Continue antihypertensive medications as already ordered, these medications have been reviewed and there are no changes at this time.   3. Primary osteoarthritis involving multiple joints Continue NSAID medications as already ordered, these medications have been reviewed and there are no changes at this time.  Continued activity and therapy was stressed.    Hortencia Pilar, MD  11/28/2018 8:41 AM

## 2019-05-26 ENCOUNTER — Telehealth: Payer: Self-pay | Admitting: Gastroenterology

## 2019-05-26 NOTE — Telephone Encounter (Signed)
Patient called & he would like to schedule a colonoscopy 1 yr sooner than recommended. He has good insurance & possibly changing jobs.

## 2019-05-28 NOTE — Telephone Encounter (Signed)
Left vm for pt to return my call to schedule a repeat colonoscopy. Advised pt he will need to contact his insurance company prior to scheduling due to it being sooner than recommended from his last colonoscopy.

## 2019-08-13 IMAGING — DX DG KNEE 1-2V PORT*R*
3 series · 3 of 3 positions shown · non-contrast
Comparison: CT 12/21/2017

CLINICAL DATA: Knee replacement

EXAM:
PORTABLE RIGHT KNEE - 1-2 VIEW

[knee ap]
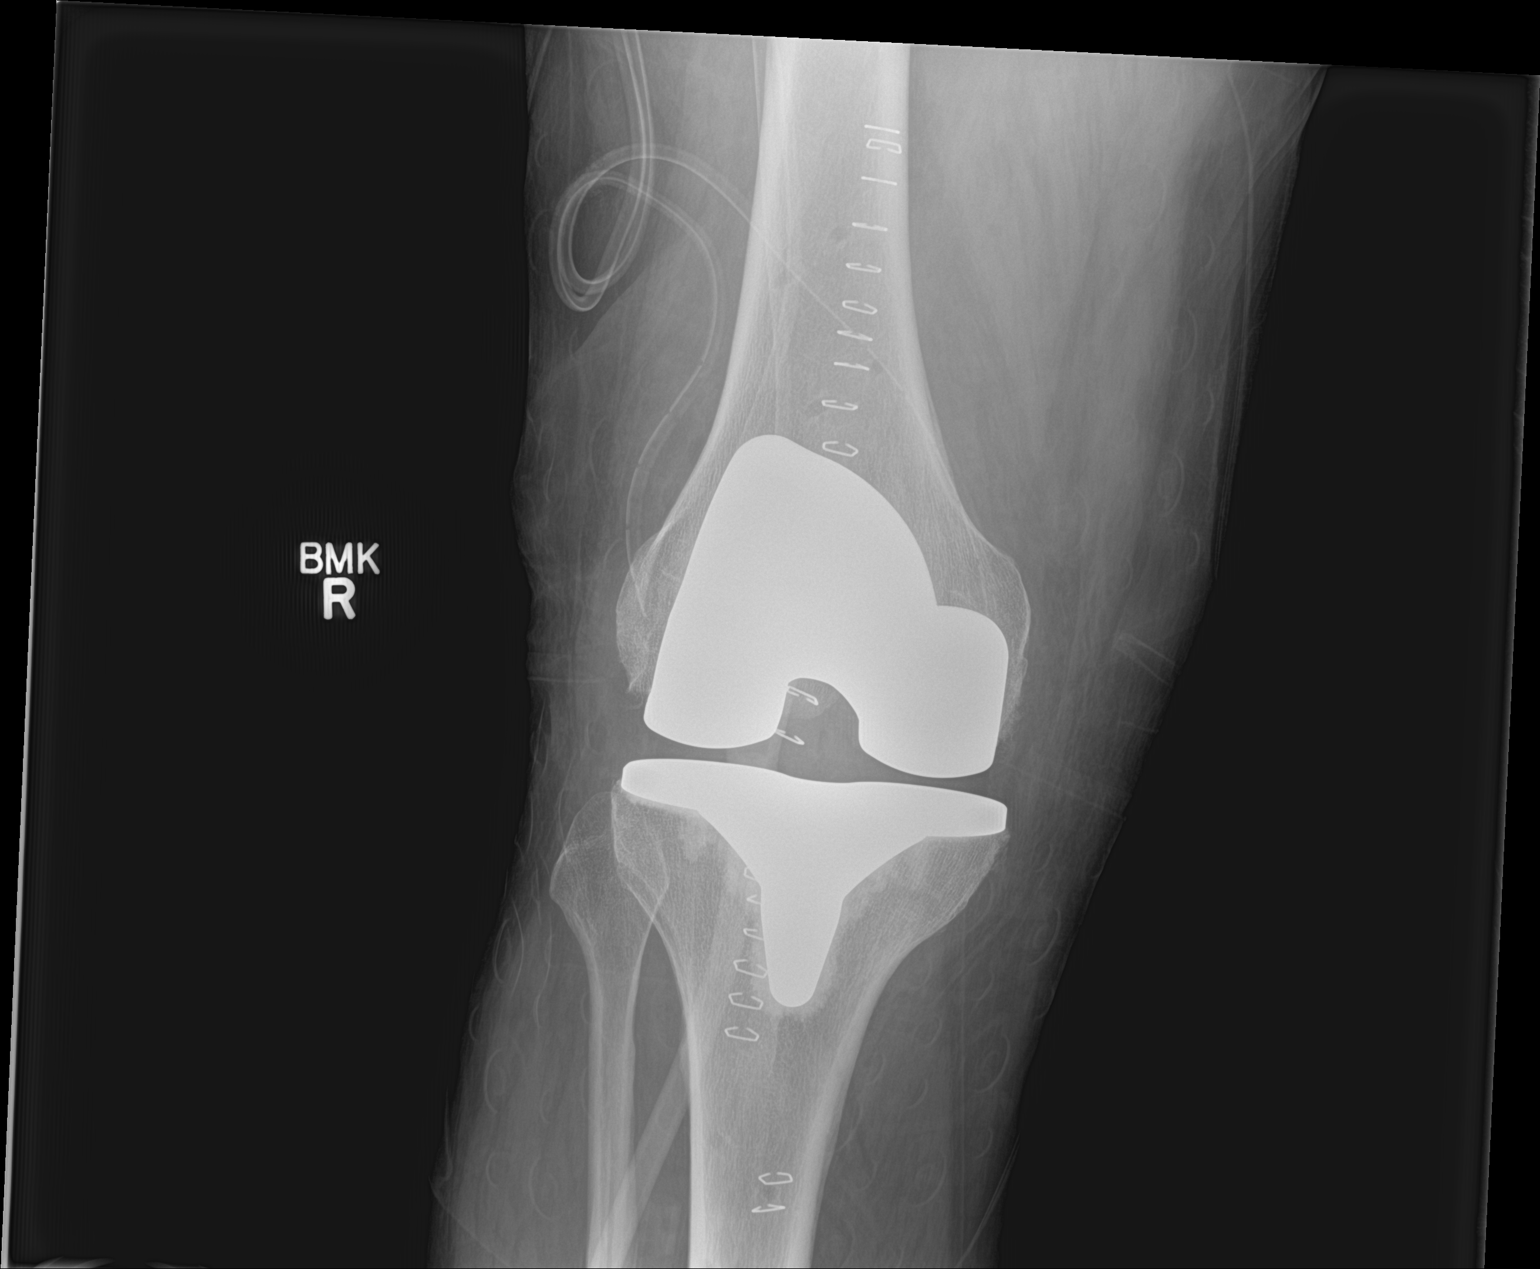

[knee lat (1 of 2)]
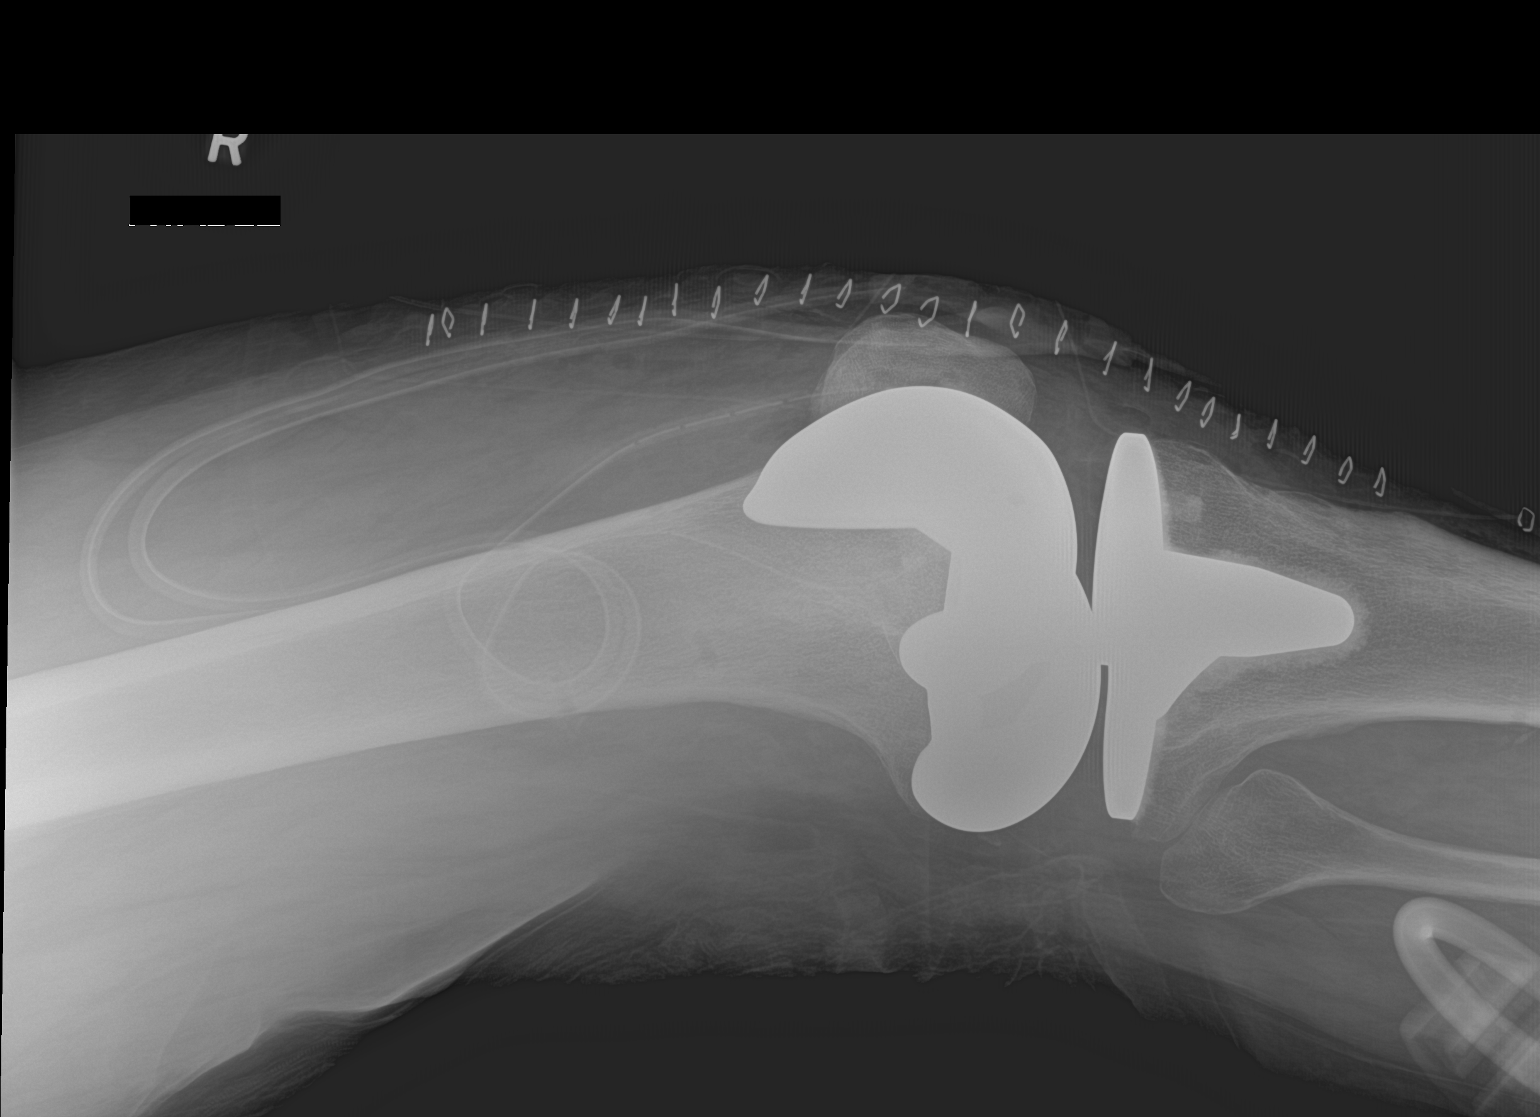

[knee lat (2 of 2)]
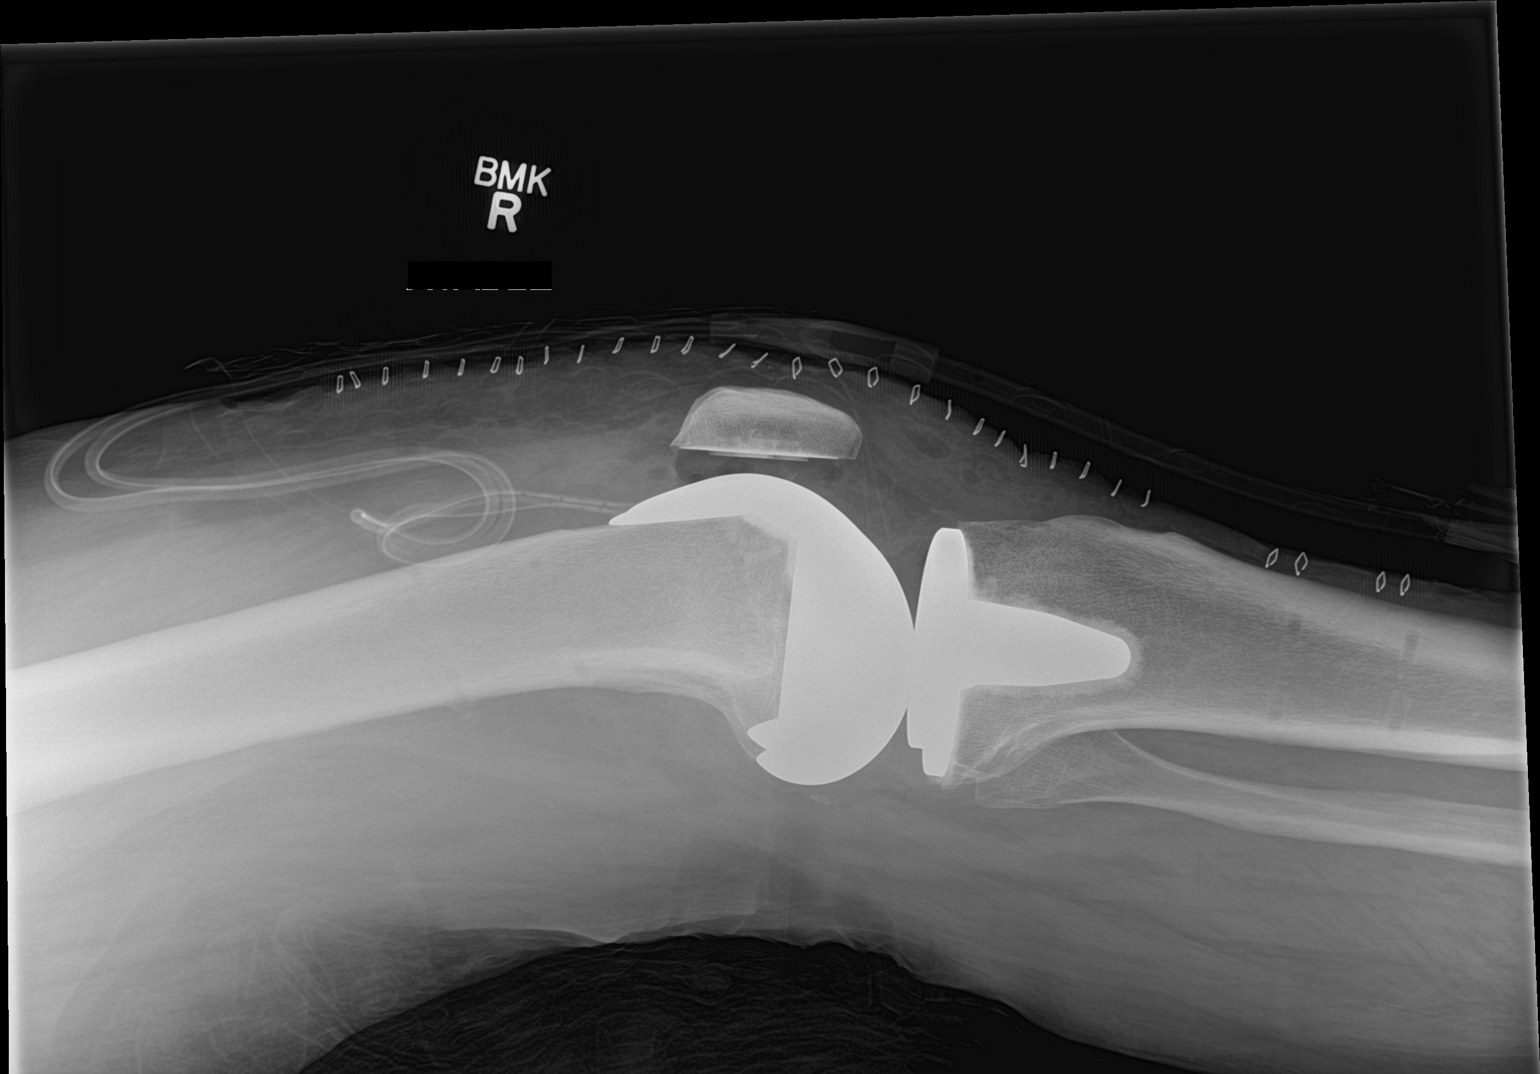

[3 of 3 positions shown; findings below may reference images not displayed]

FINDINGS: Status post knee replacement with normal alignment and intact
hardware. Gas within the soft tissues consistent with recent
surgery. Surgical drain in the suprapatellar region.
IMPRESSION: Status post right knee replacement with expected postsurgical
changes

## 2019-12-08 ENCOUNTER — Telehealth: Payer: Self-pay | Admitting: Gastroenterology

## 2019-12-08 NOTE — Telephone Encounter (Signed)
Patient called & wanted to schedule a 13yr f/u colonoscopy for June.(the only time he could do it). I have ask him to call back in March 2021 to check the schedule. Currently we are not scheduling elective procedures.

## 2020-03-01 ENCOUNTER — Encounter
Admission: RE | Admit: 2020-03-01 | Discharge: 2020-03-01 | Disposition: A | Discharge status: Home / Self Care | Payer: Self-pay | Source: Ambulatory Visit | Attending: Physical Medicine and Rehabilitation | Admitting: Physical Medicine and Rehabilitation

## 2020-03-01 ENCOUNTER — Other Ambulatory Visit: Payer: Self-pay

## 2020-03-01 NOTE — Patient Instructions (Signed)
Your procedure is scheduled on: Thursday March 11, 2020 Report to Day Surgery. To find out your arrival time please call [phone] between 1PM - 3PM on Wednesday.  Remember: Instructions that are not followed completely may result in serious medical risk,  up to and including death, or upon the discretion of your surgeon and anesthesiologist your  surgery may need to be rescheduled.     _X__ 1. Do not eat food after midnight the night before your procedure.                 No gum chewing or hard candies. You may drink clear liquids up to 2 hours                 before you are scheduled to arrive for your surgery- DO not drink clear                 liquids within 2 hours of the start of your surgery.                 Clear Liquids include:  water, apple juice without pulp, clear Gatorade, G2 or                  Gatorade Zero (avoid Red/Purple/Blue), Black Coffee or Tea (Do not add                 anything to coffee or tea).  __X__2.  On the morning of surgery brush your teeth with toothpaste and water, you                may rinse your mouth with mouthwash if you wish.  Do not swallow any toothpaste of mouthwash.     _X__ 3.  No Alcohol for 24 hours before or after surgery.   _X__ 4.  Do Not Smoke or use e-cigarettes For 24 Hours Prior to Your Surgery.                 Do not use any chewable tobacco products for at least 6 hours prior to                 surgery.  __x__  5.  Notify your doctor if there is any change in your medical condition      (cold, fever, infections).     Do not wear jewelry, make-up, hairpins, clips or nail polish. Do not wear lotions, powders, or perfumes. You may wear deodorant. Do not shave 48 hours prior to surgery. Men may shave face and neck. Do not bring valuables to the hospital.    [facility] is not responsible for any belongings or valuables.  Contacts, dentures or bridgework may not be worn into surgery. Leave your  suitcase in the car. After surgery it may be brought to your room. For patients admitted to the hospital, discharge time is determined by your treatment team.   Patients discharged the day of surgery will not be allowed to drive home.   Make arrangements for someone to be with you for the first 24 hours of your Same Day Discharge.   __x__ Take these medicines the morning of surgery with A SIP OF WATER:    1. metoprolol succinate (TOPROL-XL) 25    __x__ Fleet Enema (as directed)   __x__ Stop Anti-inflammatories such as ibuprofen, Aleve, naproxen, aspirin and or BC powders.     __x__ Stop supplements until after surgery.    __x__ Do not start any  herbal supplements before your procedure.

## 2020-03-05 NOTE — H&P (Signed)
NAME: Louis Salazar, Louis Salazar. MEDICAL RECORD [id number] ACCOUNT [id number] DATE OF BIRTH:Apr 14, 1956 FACILITY: [facility] LOCATION:  PHYSICIAN:Jazariah Teall Farrel Conners, MD  HISTORY AND PHYSICAL  DATE OF ADMISSION:  03/11/2020  CHIEF COMPLAINT:  Difficulty voiding.  HISTORY OF PRESENT ILLNESS:  The patient is a 64 year old Caucasian male with a long history of BPH with lower urinary tract symptoms managed with tamsulosin and finasteride.  Symptoms have recurred on 2-drug therapy and he now has an IPS score of 18  with a quality of life score of 5.  Evaluation in the office included a Uroflow study indicating a peak flow rate of 10 mL per second with an average flow rate of 5.6 mL per second and based upon a voided volume of 142 mL.  Prostate ultrasound dated  03/08 indicated a 30.3 mL prostate without evidence of median lobe.  Cystoscopy on 03/15 indicated a 3 cm prostatic urethral length without evidence of median lobe and mildly trabeculated bladder.  The patient comes in now for UroLift.  ALLERGIES:  No drug allergies.  CURRENT MEDICATIONS:  Metoprolol, finasteride, temazepam, tamsulosin, vitamin C, clomiphene, and an over-the-counter antidiarrheal medication.  PAST SURGICAL HISTORY:  Two left inguinal herniorrhaphy 60s, right inguinal herniorrhaphy in 1992, left wrist injury repair in 1999, right shoulder repair in 2005 and Nissen fundoplication in [id number].  PAST AND CURRENT MEDICAL CONDITIONS: 1.  Chronic right shoulder pain. 2.  GERD. 3.  Chronic insomnia. 4.  Erectile dysfunction. 5.  Hypogonadism.  REVIEW OF SYSTEMS:  The patient denies chest pain, heart disease, stroke or hypertension.  SOCIAL HISTORY:  The patient chews snuff.  He denies cigarette use.  He consumes 1-4 alcoholic beverages per week.  FAMILY HISTORY:  There is no family history of prostate cancer.  PHYSICAL EXAMINATION: VITAL SIGNS:  Height 5 feet 10 inches, weight 236 pounds. GENERAL:  Obese white male in no acute  distress. HEENT:  Sclerae were clear.  Pupils are equal, round, reactive to light and accommodation.  Extraocular movements are intact. NECK:  No palpable masses or tenderness.  Thyroid gland was smooth without palpable nodules.  No audible carotid bruits. LYMPHATIC:  No palpable cervical or inguinal adenopathy. PULMONARY:  Lungs clear to auscultation. CARDIOVASCULAR:  Regular rhythm and rate without audible murmurs. ABDOMEN:  Soft, nontender abdomen.  No CVA tenderness. GENITOURINARY:  Uncircumcised.  Testes were smooth but atrophic, 16 mL in size each. RECTAL:  30 gram smooth, nontender prostate. NEUROMUSCULAR:  Alert and oriented x3.  IMPRESSION:  Benign prostatic hypertrophy with lower urinary tract symptoms.  PLAN:  UroLift.  JN/NUANCE  D:03/05/2020 T:03/05/2020 JOB:[id number]

## 2020-03-09 ENCOUNTER — Encounter
Admission: RE | Admit: 2020-03-09 | Discharge: 2020-03-09 | Disposition: A | Discharge status: Home / Self Care | Payer: BC Managed Care – PPO | Source: Ambulatory Visit | Attending: Urology | Admitting: Urology

## 2020-03-09 ENCOUNTER — Other Ambulatory Visit: Payer: Self-pay

## 2020-03-09 DIAGNOSIS — I10 Essential (primary) hypertension: Secondary | ICD-10-CM | POA: Diagnosis not present

## 2020-03-09 DIAGNOSIS — Z01818 Encounter for other preprocedural examination: Secondary | ICD-10-CM | POA: Diagnosis present

## 2020-03-09 DIAGNOSIS — Z20822 Contact with and (suspected) exposure to covid-19: Secondary | ICD-10-CM | POA: Insufficient documentation

## 2020-03-09 LAB — BASIC METABOLIC PANEL
Anion gap: 8 (ref 5–15)
BUN: 29 mg/dL — ABNORMAL HIGH (ref 8–23)
CO2: 25 mmol/L (ref 22–32)
Calcium: 8.2 mg/dL — ABNORMAL LOW (ref 8.9–10.3)
Chloride: 108 mmol/L (ref 98–111)
Creatinine, Ser: 0.96 mg/dL (ref 0.61–1.24)
GFR calc Af Amer: >60 mL/min (ref >60)
GFR calc non Af Amer: >60 mL/min (ref >60)
Glucose, Bld: 121 mg/dL — ABNORMAL HIGH (ref 70–99)
Potassium: 4.4 mmol/L (ref 3.5–5.1)
Sodium: 141 mmol/L (ref 135–145)

## 2020-03-09 LAB — CBC
HCT: 48 % (ref 39.0–52.0)
Hemoglobin: 15.4 g/dL (ref 13.0–17.0)
MCH: 29.1 pg (ref 26.0–34.0)
MCHC: 32.1 g/dL (ref 30.0–36.0)
MCV: 90.6 fL (ref 80.0–100.0)
Platelets: 177 10*3/uL (ref 150–400)
RBC: 5.3 MIL/uL (ref 4.22–5.81)
RDW: 14.6 % (ref 11.5–15.5)
WBC: 8.4 10*3/uL (ref 4.0–10.5)
nRBC: 0 % (ref 0.0–0.2)

## 2020-03-09 LAB — SARS CORONAVIRUS 2 (TAT 6-24 HRS): SARS Coronavirus 2: NEGATIVE

## 2020-03-11 ENCOUNTER — Ambulatory Visit
Admission: RE | Admit: 2020-03-11 | Discharge: 2020-03-11 | Disposition: A | Discharge status: Home / Self Care | Payer: BC Managed Care – PPO | Source: Ambulatory Visit | Attending: Urology | Admitting: Urology

## 2020-03-11 ENCOUNTER — Ambulatory Visit: Payer: BC Managed Care – PPO | Admitting: Anesthesiology

## 2020-03-11 ENCOUNTER — Encounter: Payer: Self-pay | Admitting: Urology

## 2020-03-11 ENCOUNTER — Other Ambulatory Visit: Payer: Self-pay

## 2020-03-11 ENCOUNTER — Encounter
Admission: RE | Disposition: A | Discharge status: Home / Self Care | Payer: Self-pay | Source: Ambulatory Visit | Attending: Urology

## 2020-03-11 DIAGNOSIS — N138 Other obstructive and reflux uropathy: Secondary | ICD-10-CM | POA: Insufficient documentation

## 2020-03-11 DIAGNOSIS — Z6838 Body mass index (BMI) 38.0-38.9, adult: Secondary | ICD-10-CM | POA: Insufficient documentation

## 2020-03-11 DIAGNOSIS — Z79899 Other long term (current) drug therapy: Secondary | ICD-10-CM | POA: Diagnosis not present

## 2020-03-11 DIAGNOSIS — Z87891 Personal history of nicotine dependence: Secondary | ICD-10-CM | POA: Insufficient documentation

## 2020-03-11 DIAGNOSIS — G4730 Sleep apnea, unspecified: Secondary | ICD-10-CM | POA: Insufficient documentation

## 2020-03-11 DIAGNOSIS — N320 Bladder-neck obstruction: Secondary | ICD-10-CM | POA: Diagnosis not present

## 2020-03-11 DIAGNOSIS — F5104 Psychophysiologic insomnia: Secondary | ICD-10-CM | POA: Insufficient documentation

## 2020-03-11 DIAGNOSIS — I10 Essential (primary) hypertension: Secondary | ICD-10-CM | POA: Insufficient documentation

## 2020-03-11 DIAGNOSIS — N401 Enlarged prostate with lower urinary tract symptoms: Secondary | ICD-10-CM | POA: Diagnosis present

## 2020-03-11 HISTORY — PX: CYSTOSCOPY WITH INSERTION OF UROLIFT: SHX6678

## 2020-03-11 SURGERY — CYSTOSCOPY WITH INSERTION OF UROLIFT
Anesthesia: General

## 2020-03-11 MED ORDER — HYDROCODONE-ACETAMINOPHEN 10-325 MG PO TABS: 1.0000 | ORAL_TABLET | ORAL | 0 refills | Status: DC | PRN

## 2020-03-11 MED ORDER — FAMOTIDINE 20 MG PO TABS
ORAL_TABLET | ORAL | Status: AC
  Administered 2020-03-11: 20 mg via ORAL
  Filled 2020-03-11: qty 1

## 2020-03-11 MED ORDER — LIDOCAINE HCL URETHRAL/MUCOSAL 2 % EX GEL
CUTANEOUS | Status: AC
  Filled 2020-03-11: qty 10

## 2020-03-11 MED ORDER — BELLADONNA ALKALOIDS-OPIUM 16.2-60 MG RE SUPP
RECTAL | Status: AC
  Filled 2020-03-11: qty 1

## 2020-03-11 MED ORDER — CIPROFLOXACIN HCL 500 MG PO TABS: 500.0000 mg | ORAL_TABLET | Freq: Two times a day (BID) | ORAL | 0 refills | Status: DC

## 2020-03-11 MED ORDER — LIDOCAINE HCL URETHRAL/MUCOSAL 2 % EX GEL
CUTANEOUS | Status: DC | PRN
  Administered 2020-03-11: 1

## 2020-03-11 MED ORDER — FAMOTIDINE 20 MG PO TABS: 20.0000 mg | ORAL_TABLET | Freq: Once | ORAL | Status: AC

## 2020-03-11 MED ORDER — ONDANSETRON HCL 4 MG/2ML IJ SOLN: 4.0000 mg | Freq: Once | INTRAMUSCULAR | Status: DC | PRN

## 2020-03-11 MED ORDER — FENTANYL CITRATE (PF) 100 MCG/2ML IJ SOLN
INTRAMUSCULAR | Status: AC
  Filled 2020-03-11: qty 2

## 2020-03-11 MED ORDER — URIBEL 118 MG PO CAPS: 1.0000 | ORAL_CAPSULE | Freq: Four times a day (QID) | ORAL | 3 refills | Status: DC | PRN

## 2020-03-11 MED ORDER — FENTANYL CITRATE (PF) 100 MCG/2ML IJ SOLN
25.0000 ug | INTRAMUSCULAR | Status: AC | PRN
  Administered 2020-03-11 (×4): 25 ug via INTRAVENOUS

## 2020-03-11 MED ORDER — LACTATED RINGERS IV SOLN: INTRAVENOUS | Status: DC

## 2020-03-11 MED ORDER — FENTANYL CITRATE (PF) 100 MCG/2ML IJ SOLN
INTRAMUSCULAR | Status: DC | PRN
  Administered 2020-03-11 (×2): 25 ug via INTRAVENOUS

## 2020-03-11 MED ORDER — FENTANYL CITRATE (PF) 100 MCG/2ML IJ SOLN
INTRAMUSCULAR | Status: AC
  Administered 2020-03-11: 25 ug via INTRAVENOUS
  Filled 2020-03-11: qty 2

## 2020-03-11 MED ORDER — MIDAZOLAM HCL 2 MG/2ML IJ SOLN
INTRAMUSCULAR | Status: DC | PRN
  Administered 2020-03-11: 2 mg via INTRAVENOUS

## 2020-03-11 MED ORDER — PROPOFOL 10 MG/ML IV BOLUS
INTRAVENOUS | Status: DC | PRN
  Administered 2020-03-11: 200 mg via INTRAVENOUS

## 2020-03-11 MED ORDER — MIDAZOLAM HCL 2 MG/2ML IJ SOLN
INTRAMUSCULAR | Status: AC
  Filled 2020-03-11: qty 2

## 2020-03-11 MED ORDER — OXYCODONE HCL 5 MG PO TABS: 5.0000 mg | ORAL_TABLET | Freq: Once | ORAL | Status: DC | PRN

## 2020-03-11 MED ORDER — BELLADONNA ALKALOIDS-OPIUM 16.2-60 MG RE SUPP
RECTAL | Status: DC | PRN
  Administered 2020-03-11: 1 via RECTAL

## 2020-03-11 MED ORDER — CEFAZOLIN SODIUM-DEXTROSE 1-4 GM/50ML-% IV SOLN
INTRAVENOUS | Status: AC
  Filled 2020-03-11: qty 50

## 2020-03-11 MED ORDER — LIDOCAINE HCL (CARDIAC) PF 100 MG/5ML IV SOSY
PREFILLED_SYRINGE | INTRAVENOUS | Status: DC | PRN
  Administered 2020-03-11: 100 mg via INTRAVENOUS

## 2020-03-11 MED ORDER — CEFAZOLIN SODIUM-DEXTROSE 1-4 GM/50ML-% IV SOLN
1.0000 g | Freq: Three times a day (TID) | INTRAVENOUS | Status: DC
  Administered 2020-03-11: 1 g via INTRAVENOUS

## 2020-03-11 MED ORDER — OXYCODONE HCL 5 MG/5ML PO SOLN: 5.0000 mg | Freq: Once | ORAL | Status: DC | PRN

## 2020-03-11 MED ORDER — ACETAMINOPHEN 10 MG/ML IV SOLN: 1000.0000 mg | Freq: Once | INTRAVENOUS | Status: DC | PRN

## 2020-03-11 SURGICAL SUPPLY — 12 items
BAG DRAIN CYSTO-URO LG1000N (MISCELLANEOUS) ×3 IMPLANT
GLOVE BIO SURGEON STRL SZ7.5 (GLOVE) ×3 IMPLANT
GOWN STRL REUS W/ TWL LRG LVL3 (GOWN DISPOSABLE) ×1 IMPLANT
GOWN STRL REUS W/ TWL XL LVL3 (GOWN DISPOSABLE) ×1 IMPLANT
GOWN STRL REUS W/TWL LRG LVL3 (GOWN DISPOSABLE) ×3
GOWN STRL REUS W/TWL XL LVL3 (GOWN DISPOSABLE) ×3
KIT TURNOVER CYSTO (KITS) ×3 IMPLANT
PACK CYSTO AR (MISCELLANEOUS) ×3 IMPLANT
SET CYSTO W/LG BORE CLAMP LF (SET/KITS/TRAYS/PACK) ×3 IMPLANT
SYSTEM UROLIFT (Male Continence) ×8 IMPLANT
WATER STERILE IRR 1000ML POUR (IV SOLUTION) ×3 IMPLANT
WATER STERILE IRR 3000ML UROMA (IV SOLUTION) ×3 IMPLANT

## 2020-03-11 NOTE — Anesthesia Postprocedure Evaluation (Signed)
Anesthesia Post Note  Patient: Louis Salazar  Procedure(s) Performed: CYSTOSCOPY WITH INSERTION OF UROLIFT (N/A )  Patient location during evaluation: PACU Anesthesia Type: General Level of consciousness: awake and alert Pain management: pain level controlled Vital Signs Assessment: post-procedure vital signs reviewed and stable Respiratory status: spontaneous breathing, nonlabored ventilation, respiratory function stable and patient connected to nasal cannula oxygen Cardiovascular status: blood pressure returned to baseline and stable Postop Assessment: no apparent nausea or vomiting Anesthetic complications: no     Last Vitals:  Vitals:   03/11/20 0940 03/11/20 0945  BP: (!) 141/86   Pulse: 71 74  Resp: (!) 9 15  Temp:    SpO2: 94% 96%    Last Pain:  Vitals:   03/11/20 0945  TempSrc:   PainSc: 3                  Arita Miss

## 2020-03-11 NOTE — Discharge Instructions (Addendum)
Benign Prostatic Hyperplasia  Benign prostatic hyperplasia (BPH) is an enlarged prostate gland that is caused by the normal aging process and not by cancer. The prostate is a walnut-sized gland that is involved in the production of semen. It is located in front of the rectum and below the bladder. The bladder stores urine and the urethra is the tube that carries the urine out of the body. The prostate may get bigger as a man gets older. An enlarged prostate can press on the urethra. This can make it harder to pass urine. The build-up of urine in the bladder can cause infection. Back pressure and infection may progress to bladder damage and kidney (renal) failure. What are the causes? This condition is part of a normal aging process. However, not all men develop problems from this condition. If the prostate enlarges away from the urethra, urine flow will not be blocked. If it enlarges toward the urethra and compresses it, there will be problems passing urine. What increases the risk? This condition is more likely to develop in men over the age of 50 years. What are the signs or symptoms? Symptoms of this condition include:  Getting up often during the night to urinate.  Needing to urinate frequently during the day.  Difficulty starting urine flow.  Decrease in size and strength of your urine stream.  Leaking (dribbling) after urinating.  Inability to pass urine. This needs immediate treatment.  Inability to completely empty your bladder.  Pain when you pass urine. This is more common if there is also an infection.  Urinary tract infection (UTI). How is this diagnosed? This condition is diagnosed based on your medical history, a physical exam, and your symptoms. Tests will also be done, such as:  A post-void bladder scan. This measures any amount of urine that may remain in your bladder after you finish urinating.  A digital rectal exam. In a rectal exam, your health care provider  checks your prostate by putting a lubricated, gloved finger into your rectum to feel the back of your prostate gland. This exam detects the size of your gland and any abnormal lumps or growths.  An exam of your urine (urinalysis).  A prostate specific antigen (PSA) screening. This is a blood test used to screen for prostate cancer.  An ultrasound. This test uses sound waves to electronically produce a picture of your prostate gland. Your health care provider may refer you to a specialist in kidney and prostate diseases (urologist). How is this treated? Once symptoms begin, your health care provider will monitor your condition (active surveillance or watchful waiting). Treatment for this condition will depend on the severity of your condition. Treatment may include:  Observation and yearly exams. This may be the only treatment needed if your condition and symptoms are mild.  Medicines to relieve your symptoms, including: ? Medicines to shrink the prostate. ? Medicines to relax the muscle of the prostate.  Surgery in severe cases. Surgery may include: ? Prostatectomy. In this procedure, the prostate tissue is removed completely through an open incision or with a laparoscope or robotics. ? Transurethral resection of the prostate (TURP). In this procedure, a tool is inserted through the opening at the tip of the penis (urethra). It is used to cut away tissue of the inner core of the prostate. The pieces are removed through the same opening of the penis. This removes the blockage. ? Transurethral incision (TUIP). In this procedure, small cuts are made in the prostate. This lessens   the prostate's pressure on the urethra. ? Transurethral microwave thermotherapy (TUMT). This procedure uses microwaves to create heat. The heat destroys and removes a small amount of prostate tissue. ? Transurethral needle ablation (TUNA). This procedure uses radio frequencies to destroy and remove a small amount of  prostate tissue. ? Interstitial laser coagulation ([place]). This procedure uses a laser to destroy and remove a small amount of prostate tissue. ? Transurethral electrovaporization (TUVP). This procedure uses electrodes to destroy and remove a small amount of prostate tissue. ? Prostatic urethral lift. This procedure inserts an implant to push the lobes of the prostate away from the urethra. Follow these instructions at home:  Take over-the-counter and prescription medicines only as told by your health care provider.  Monitor your symptoms for any changes. Contact your health care provider with any changes.  Avoid drinking large amounts of liquid before going to bed or out in public.  Avoid or reduce how much caffeine or alcohol you drink.  Give yourself time when you urinate.  Keep all follow-up visits as told by your health care provider. This is important. Contact a health care provider if:  You have unexplained back pain.  Your symptoms do not get better with treatment.  You develop side effects from the medicine you are taking.  Your urine becomes very dark or has a bad smell.  Your lower abdomen becomes distended and you have trouble passing your urine. Get help right away if:  You have a fever or chills.  You suddenly cannot urinate.  You feel lightheaded, or very dizzy, or you faint.  There are large amounts of blood or clots in the urine.  Your urinary problems become hard to manage.  You develop moderate to severe low back or flank pain. The flank is the side of your body between the ribs and the hip. These symptoms may represent a serious problem that is an emergency. Do not wait to see if the symptoms will go away. Get medical help right away. Call your local emergency services (911 in the [place] Do not drive yourself to the hospital. Summary  Benign prostatic hyperplasia (BPH) is an enlarged prostate that is caused by the normal aging process and not by  cancer.  An enlarged prostate can press on the urethra. This can make it hard to pass urine.  This condition is part of a normal aging process and is more likely to develop in men over the age of 35 years.  Get help right away if you suddenly cannot urinate. This information is not intended to replace advice given to you by your health care provider. Make sure you discuss any questions you have with your health care provider. Document Revised: 10/08/2018 Document Reviewed: 12/18/2016 Elsevier Patient Education  2020 [employer]. Cystoscopy Cystoscopy is a procedure that is used to help diagnose and sometimes treat conditions that affect the lower urinary tract. The lower urinary tract includes the bladder and the urethra. The urethra is the tube that drains urine from the bladder. Cystoscopy is done using a thin, tube-shaped instrument with a light and camera at the end (cystoscope). The cystoscope may be hard or flexible, depending on the goal of the procedure. The cystoscope is inserted through the urethra, into the bladder. Cystoscopy may be recommended if you have:  Urinary tract infections that keep coming back.  Blood in the urine (hematuria).  An inability to control when you urinate (urinary incontinence) or an overactive bladder.  Unusual cells found in  a urine sample.  A blockage in the urethra, such as a urinary stone.  Painful urination.  An abnormality in the bladder found during an intravenous pyelogram (IVP) or CT scan. Cystoscopy may also be done to remove a sample of tissue to be examined under a microscope (biopsy). Tell a health care provider about:  Any allergies you have.  All medicines you are taking, including vitamins, herbs, eye drops, creams, and over-the-counter medicines.  Any problems you or family members have had with anesthetic medicines.  Any blood disorders you have.  Any surgeries you have had.  Any medical conditions you have.  Whether  you are pregnant or may be pregnant. What are the risks? Generally, this is a safe procedure. However, problems may occur, including:  Infection.  Bleeding.  Allergic reactions to medicines.  Damage to other structures or organs. What happens before the procedure?  Ask your health care provider about: ? Changing or stopping your regular medicines. This is especially important if you are taking diabetes medicines or blood thinners. ? Taking medicines such as aspirin and ibuprofen. These medicines can thin your blood. Do not take these medicines unless your health care provider tells you to take them. ? Taking over-the-counter medicines, vitamins, herbs, and supplements.  Follow instructions from your health care provider about eating or drinking restrictions.  Ask your health care provider what steps will be taken to help prevent infection. These may include: ? Washing skin with a germ-killing soap. ? Taking antibiotic medicine.  You may have an exam or testing, such as: ? X-rays of the bladder, urethra, or kidneys. ? Urine tests to check for signs of infection.  Plan to have someone take you home from the hospital or clinic. What happens during the procedure?   You will be given one or more of the following: ? A medicine to help you relax (sedative). ? A medicine to numb the area (local anesthetic).  The area around the opening of your urethra will be cleaned.  The cystoscope will be passed through your urethra into your bladder.  Germ-free (sterile) fluid will flow through the cystoscope to fill your bladder. The fluid will stretch your bladder so that your health care provider can clearly examine your bladder walls.  Your doctor will look at the urethra and bladder. Your doctor may take a biopsy or remove stones.  The cystoscope will be removed, and your bladder will be emptied. The procedure may vary among health care providers and hospitals. What can I expect after  the procedure? After the procedure, it is common to have:  Some soreness or pain in your abdomen and urethra.  Urinary symptoms. These include: ? Mild pain or burning when you urinate. Pain should stop within a few minutes after you urinate. This may last for up to 1 week. ? A small amount of blood in your urine for several days. ? Feeling like you need to urinate but producing only a small amount of urine. Follow these instructions at home: Medicines  Take over-the-counter and prescription medicines only as told by your health care provider.  If you were prescribed an antibiotic medicine, take it as told by your health care provider. Do not stop taking the antibiotic even if you start to feel better. General instructions  Return to your normal activities as told by your health care provider. Ask your health care provider what activities are safe for you.  Do not drive for 24 hours if you were given a  sedative during your procedure.  Watch for any blood in your urine. If the amount of blood in your urine increases, call your health care provider.  Follow instructions from your health care provider about eating or drinking restrictions.  If a tissue sample was removed for testing (biopsy) during your procedure, it is up to you to get your test results. Ask your health care provider, or the department that is doing the test, when your results will be ready.  Drink enough fluid to keep your urine pale yellow.  Keep all follow-up visits as told by your health care provider. This is important. Contact a health care provider if you:  Have pain that gets worse or does not get better with medicine, especially pain when you urinate.  Have trouble urinating.  Have more blood in your urine. Get help right away if you:  Have blood clots in your urine.  Have abdominal pain.  Have a fever or chills.  Are unable to urinate. Summary  Cystoscopy is a procedure that is used to help  diagnose and sometimes treat conditions that affect the lower urinary tract.  Cystoscopy is done using a thin, tube-shaped instrument with a light and camera at the end.  After the procedure, it is common to have some soreness or pain in your abdomen and urethra.  Watch for any blood in your urine. If the amount of blood in your urine increases, call your health care provider.  If you were prescribed an antibiotic medicine, take it as told by your health care provider. Do not stop taking the antibiotic even if you start to feel better. This information is not intended to replace advice given to you by your health care provider. Make sure you discuss any questions you have with your health care provider. Document Revised: 11/05/2018 Document Reviewed: 11/05/2018 Elsevier Patient Education  2020 [employer]   1) The drugs that you were given will stay in your system until tomorrow so for the next 24 hours you should not:  A) Drive an automobile B) Make any legal decisions C) Drink any alcoholic beverage   2) You may resume regular meals tomorrow.  Today it is better to start with liquids and gradually work up to solid foods.  You may eat anything you prefer, but it is better to start with liquids, then soup and crackers, and gradually work up to solid foods.   3) Please notify your doctor immediately if you have any unusual bleeding, trouble breathing, redness and pain at the surgery site, drainage, fever, or pain not relieved by medication.    4) Additional Instructions:        Please contact your physician with any problems or Same Day Surgery at [phone], Monday through Friday 6 am to 4 pm, or [place] at [facility] number at [phone].

## 2020-03-11 NOTE — Anesthesia Preprocedure Evaluation (Signed)
Anesthesia Evaluation  Patient identified by MRN, date of birth, ID band Patient awake    Reviewed: Allergy & Precautions, H&P , NPO status , Patient's Chart, lab work & pertinent test results, reviewed documented beta blocker date and time   Airway Mallampati: III  TM Distance: >3 FB Neck ROM: full   Comment: Large beard Dental  (+) Poor Dentition, Teeth Intact, Dental Advisory Given   Pulmonary sleep apnea , former smoker,  Not formally diagnosed but patient snores heavily   Pulmonary exam normal        Cardiovascular Exercise Tolerance: Poor hypertension, Pt. on medications + DVT  Normal cardiovascular exam Rhythm:regular Rate:Normal     Neuro/Psych PSYCHIATRIC DISORDERS Anxiety Depression  Neuromuscular disease    GI/Hepatic Neg liver ROS, GERD  Controlled,S/p Nissen, well controlled now   Endo/Other  Morbid obesity  Renal/GU negative Renal ROS  negative genitourinary   Musculoskeletal   Abdominal   Peds  Hematology negative hematology ROS (+)   Anesthesia Other Findings Past Medical History: No date: Arthritis     Comment:  right shoulder; right knee 02/28/2016: Depression 01/2018: DVT (deep venous thrombosis) ([place])     Comment:  RIGHT POPLITEAL No date: Enlarged prostate No date: Foreign body, eye     Comment:  left eyelid 10/24/2017: Gastritis No date: GERD (gastroesophageal reflux disease)     Comment:  nissan plication and this has improved No date: Hemorrhoid 05/24/2015: History of colon polyps No date: Hypertension     Comment:  controlled on med No date: Neck pain     Comment:  "EXPERIENCE CRICK IN NECK". comes and goes 2019 No date: Neuromuscular disorder ([place])     Comment:  hands are tingly and go numb. possibly carpal tunnel.                right hand No date: Pre-diabetes     Comment:  A1C in 2019 was 6.0 No date: Substance abuse ([place])     Comment:  hx of inhalants, last use 2010.   regular  daily alcohol               intake (2019) 0000000: Umbilical hernia without obstruction and without gangrene Past Surgical History: 1999: ABDOMINAL SURGERY     Comment:  nissen fundoplication [id number]: BUNIONECTOMY; Left 03/12/2018: CARPAL TUNNEL RELEASE; Right     Comment:  Procedure: CARPAL TUNNEL RELEASE;  Surgeon: Hessie Knows, MD;  Location: [facility];  Service: Orthopedics;               Laterality: Right; 10/18/2015: COLONOSCOPY WITH PROPOFOL; N/A     Comment:  Procedure: COLONOSCOPY WITH PROPOFOL;  Surgeon: Lucilla Lame, MD;  Location: [place];  Service:               Endoscopy;  Laterality: N/A; 10/18/2015: ESOPHAGOGASTRODUODENOSCOPY (EGD) WITH PROPOFOL; N/A     Comment:  Procedure: ESOPHAGOGASTRODUODENOSCOPY (EGD) WITH               PROPOFOL;  Surgeon: Lucilla Lame, MD;  Location: [place];  Service: Endoscopy;  Laterality: N/A;                wants as early as possible No date: HERNIA REPAIR 09/24/2018: IVC FILTER INSERTION;  N/A     Comment:  Procedure: IVC FILTER INSERTION;  Surgeon: [name], MD;  Location: [place] [facility];  Service:              Cardiovascular;  Laterality: N/A; 02/18/2018: KNEE ARTHROSCOPY; Right     Comment:  Procedure: ARTHROSCOPY KNEE;  Surgeon: Dereck Leep,               MD;  Location: [facility];  Service: Orthopedics;                Laterality: Right; 02/18/2018: KNEE ARTHROSCOPY WITH LATERAL MENISECTOMY; Right     Comment:  Procedure: KNEE ARTHROSCOPY WITH LATERAL MENISECTOMY;                Surgeon: Dereck Leep, MD;  Location: [facility];                Service: Orthopedics;  Laterality: Right; 02/18/2018: KNEE ARTHROSCOPY WITH MEDIAL MENISECTOMY; Right     Comment:  Procedure: KNEE ARTHROSCOPY WITH MEDIAL MENISECTOMY;                Surgeon: Dereck Leep, MD;  Location: [facility];                Service: Orthopedics;  Laterality:  Right; [id number]: NISSEN FUNDOPLICATION [name]: SHOULDER SURGERY     Comment:  Right shoulder replacement [id number]: UMBILICAL HERNIA REPAIR; N/A     Comment:  Procedure: HERNIA REPAIR UMBILICAL ADULT;  Surgeon:               Robert Bellow, MD;  Location: [facility];  Service:               General;  Laterality: N/A; 1990: WRIST SURGERY; Left     Comment:  bones fused. metal in wrist   Reproductive/Obstetrics negative OB ROS                             Anesthesia Physical  Anesthesia Plan  ASA: III  Anesthesia Plan: General   Post-op Pain Management:    Induction: Intravenous  PONV Risk Score and Plan: 3 and Ondansetron and Dexamethasone  Airway Management Planned: LMA and Oral ETT  Additional Equipment: None  Intra-op Plan:   Post-operative Plan: Extubation in OR  Informed Consent: I have reviewed the patients History and Physical, chart, labs and discussed the procedure including the risks, benefits and alternatives for the proposed anesthesia with the patient or authorized representative who has indicated his/her understanding and acceptance.     Dental Advisory Given  Plan Discussed with: CRNA  Anesthesia Plan Comments: (Discussed risks of anesthesia with patient, including PONV, sore throat, lip/dental damage. Rare risks discussed as well, such as cardiorespiratory and neurological sequelae. Patient understands.)        Anesthesia Quick Evaluation

## 2020-03-11 NOTE — OR Nursing (Signed)
Patient advises "I haven't been taking eliquis in about a year".

## 2020-03-11 NOTE — Transfer of Care (Signed)
Immediate Anesthesia Transfer of Care Note  Patient: Louis Salazar  Procedure(s) Performed: CYSTOSCOPY WITH INSERTION OF UROLIFT (N/A )  Patient Location: PACU  Anesthesia Type:General  Level of Consciousness: awake and alert   Airway & Oxygen Therapy: Patient Spontanous Breathing  Post-op Assessment: Report given to RN  Post vital signs: Reviewed and stable  Last Vitals:  Vitals Value Taken Time  BP 125/83 03/11/20 0910  Temp 36.3 C 03/11/20 0910  Pulse 85 03/11/20 0911  Resp 14 03/11/20 0911  SpO2 94 % 03/11/20 0911  Vitals shown include unvalidated device data.  Last Pain:  Vitals:   03/11/20 0752  TempSrc: Temporal  PainSc: 0-No pain         Complications: No apparent anesthesia complications

## 2020-03-11 NOTE — OR Nursing (Signed)
Per Dr. Yves Dill, secure-chat, patient may resume eliquis tomorrow.  Added to discharge instructions, medication section.

## 2020-03-11 NOTE — Anesthesia Procedure Notes (Signed)
Procedure Name: LMA Insertion Performed by: Lesle Reek, CRNA Pre-anesthesia Checklist: Patient identified, Suction available, Patient being monitored, Timeout performed and Emergency Drugs available Patient Re-evaluated:Patient Re-evaluated prior to induction Oxygen Delivery Method: Circle system utilized Preoxygenation: Pre-oxygenation with 100% oxygen Induction Type: Combination inhalational/ intravenous induction LMA: LMA inserted LMA Size: 4.0 Number of attempts: 1 Placement Confirmation: breath sounds checked- equal and bilateral,  CO2 detector and positive ETCO2 Tube secured with: Tape

## 2020-03-11 NOTE — Op Note (Signed)
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 Yves Dill MD  Anesthesia: General  Indications:See the history and physical. After informed consent the above procedure(s) were requested     Technique and findings: After adequate general anesthesia been obtained the patient was placed into dorsal lithotomy position and the perineum was prepped and draped in the usual fashion.  The 21 French cystoscope was coupled the camera and visually advanced into the bladder.  The bladder was thoroughly inspected.  No bladder mucosal lesions were identified.  Both ureteral orifices were identified and had clear efflux.  Lateral lobe prostatic hypertrophy was identified with a 3 cm prostatic urethral length.  The initial 2 UroLift implants were placed at the 2 and 10:00 positions 1.5 cm distal to the bladder neck.  The next 2 implants were placed at the 2:00 and 10:00 positions at the level of the verumontanum.  Cystoscopy was performed and the prostatic urethra was widely patent at obstruction and minimal bleeding.  The bladder was drained and scope was removed.  10 cc of viscous Xylocaine was instilled within the urethra and the bladder.  A B&O suppository was placed.  The procedure was then terminated and the patient transferred to the recovery room in stable condition.

## 2020-03-11 NOTE — H&P (Signed)
Date of Initial H&P: 03/05/20  History reviewed, patient examined, no change in status, stable for surgery.

## 2020-04-07 ENCOUNTER — Telehealth (INDEPENDENT_AMBULATORY_CARE_PROVIDER_SITE_OTHER): Payer: Self-pay | Admitting: Gastroenterology

## 2020-04-07 ENCOUNTER — Other Ambulatory Visit: Payer: Self-pay

## 2020-04-07 DIAGNOSIS — Z86010 Personal history of colonic polyps: Secondary | ICD-10-CM

## 2020-04-07 NOTE — Progress Notes (Signed)
Gastroenterology Pre-Procedure Review  Request Date: Tuesday 05/25/20 Requesting Physician: Dr. Allen Norris  PATIENT REVIEW QUESTIONS: The patient responded to the following health history questions as indicated:    1. Are you having any GI issues? no 2. Do you have a personal history of Polyps? yes (2016) 3. Do you have a family history of Colon Cancer or Polyps? no 4. Diabetes Mellitus? no 5. Joint replacements in the past 12 months?no 6. Major health problems in the past 3 months?yes (Cystoscopy 03/11/20) 7. Any artificial heart valves, MVP, or defibrillator?no    MEDICATIONS & ALLERGIES:    Patient reports the following regarding taking any anticoagulation/antiplatelet therapy:   Plavix, Coumadin, Eliquis, Xarelto, Lovenox, Pradaxa, Brilinta, or Effient? no Aspirin? no  Patient confirms/reports the following medications:  Current Outpatient Medications  Medication Sig Dispense Refill  . ascorbic acid (VITAMIN C) 250 MG CHEW Chew 250 mg by mouth daily.    . clomiPHENE (CLOMID) 50 MG tablet Take 50 mg by mouth daily.     . finasteride (PROSCAR) 5 MG tablet Take 5 mg by mouth at bedtime.     Marland Kitchen loperamide (IMODIUM) 2 MG capsule Take 2 mg by mouth daily.    . Melatonin-Pyridoxine (MELATIN PO) Take 10 mg by mouth.    . metoprolol succinate (TOPROL-XL) 25 MG 24 hr tablet Take 1 tablet (25 mg total) by mouth 2 (two) times daily. 90 tablet prn  . Multiple Vitamin (MULTIVITAMIN WITH MINERALS) TABS tablet Take 1 tablet by mouth daily.    . tamsulosin (FLOMAX) 0.4 MG CAPS capsule Take 0.4 mg by mouth at bedtime.     . temazepam (RESTORIL) 30 MG capsule Take 30 mg by mouth at bedtime.   0  . traMADol (ULTRAM) 50 MG tablet Take 1-2 tablets (50-100 mg total) by mouth every 4 (four) hours as needed for moderate pain. 60 tablet 0  . amoxicillin (AMOXIL) 500 MG capsule Take 2,000 mg by mouth See admin instructions. Take 2000 mg 1 hour prior to dental work    . ELIQUIS 5 MG TABS tablet TAKE 1 TABLET BY  MOUTH TWICE DAILY (Patient not taking: Reported on 02/25/2020) 60 tablet 6  . HYDROcodone-acetaminophen (NORCO) 10-325 MG tablet Take 1 tablet by mouth every 4 (four) hours as needed for moderate pain. Maximum dose per 24 hours -8 pills. (Patient not taking: Reported on 04/07/2020) 20 tablet 0  . Meth-Hyo-M Bl-Na Phos-Ph Sal (URIBEL) 118 MG CAPS Take 1 capsule (118 mg total) by mouth every 6 (six) hours as needed (dysuria). (Patient not taking: Reported on 04/07/2020) 40 capsule 3  . oxymetazoline (AFRIN) 0.05 % nasal spray Place 1 spray into both nostrils 2 (two) times daily as needed for congestion.     No current facility-administered medications for this visit.    Patient confirms/reports the following allergies:  Allergies  Allergen Reactions  . Celebrex [Celecoxib] Hives and Rash  . Sulfa Antibiotics Other (See Comments)    Unknown - childhood allergy    No orders of the defined types were placed in this encounter.   AUTHORIZATION INFORMATION Primary Insurance: 1D#: Group #:  Secondary Insurance: 1D#: Group #:  SCHEDULE INFORMATION: Date: Tuesday 06/29/21Time: Location:[facility]

## 2020-04-28 ENCOUNTER — Telehealth (INDEPENDENT_AMBULATORY_CARE_PROVIDER_SITE_OTHER): Payer: Self-pay

## 2020-04-28 NOTE — Telephone Encounter (Signed)
The bump is not indicative of a DVT (like the one you had before).  Typically the whole leg/calf will be painful and swollen vs just a knot.  It could be a phlebitis which typically resolves on its own within a week or two.  I would say that if it gets larger or more painful to let us know

## 2020-04-28 NOTE — Telephone Encounter (Signed)
I called the pt an made him aware of the NP's instructions.

## 2020-04-28 NOTE — Telephone Encounter (Signed)
I called the pt back after he left a message on the nurses line. The pt explained that yesterday he noticed a soft bump on his left ankle that only has pain when he touches it.The explained how ever he is experienceing pain  above the bump around the ankle .He has no wounds in the area its not feverish and is a # 1 on the pain scale at the moment. The patient is having no difficulty moving his limbs. The pt would like to know does he need to come in an be seen.

## 2020-05-21 ENCOUNTER — Other Ambulatory Visit
Admission: RE | Admit: 2020-05-21 | Discharge: 2020-05-21 | Disposition: A | Discharge status: Home / Self Care | Payer: BC Managed Care – PPO | Source: Ambulatory Visit | Attending: Gastroenterology | Admitting: Gastroenterology

## 2020-05-21 ENCOUNTER — Other Ambulatory Visit: Payer: Self-pay

## 2020-05-21 DIAGNOSIS — Z20822 Contact with and (suspected) exposure to covid-19: Secondary | ICD-10-CM | POA: Diagnosis not present

## 2020-05-21 DIAGNOSIS — Z01812 Encounter for preprocedural laboratory examination: Secondary | ICD-10-CM | POA: Diagnosis present

## 2020-05-22 LAB — SARS CORONAVIRUS 2 (TAT 6-24 HRS): SARS Coronavirus 2: NEGATIVE

## 2020-05-25 ENCOUNTER — Ambulatory Visit
Admission: RE | Admit: 2020-05-25 | Discharge: 2020-05-25 | Disposition: A | Discharge status: Home / Self Care | Payer: BC Managed Care – PPO | Attending: Gastroenterology | Admitting: Gastroenterology

## 2020-05-25 ENCOUNTER — Encounter
Admission: RE | Disposition: A | Discharge status: Home / Self Care | Payer: Self-pay | Source: Home / Self Care | Attending: Gastroenterology

## 2020-05-25 ENCOUNTER — Ambulatory Visit: Payer: BC Managed Care – PPO | Admitting: Certified Registered"

## 2020-05-25 ENCOUNTER — Other Ambulatory Visit: Payer: Self-pay

## 2020-05-25 ENCOUNTER — Encounter: Payer: Self-pay | Admitting: Gastroenterology

## 2020-05-25 DIAGNOSIS — Z6837 Body mass index (BMI) 37.0-37.9, adult: Secondary | ICD-10-CM | POA: Diagnosis not present

## 2020-05-25 DIAGNOSIS — Z860100 Personal history of colon polyps, unspecified: Secondary | ICD-10-CM

## 2020-05-25 DIAGNOSIS — K5730 Diverticulosis of large intestine without perforation or abscess without bleeding: Secondary | ICD-10-CM | POA: Insufficient documentation

## 2020-05-25 DIAGNOSIS — Z87891 Personal history of nicotine dependence: Secondary | ICD-10-CM | POA: Diagnosis not present

## 2020-05-25 DIAGNOSIS — K640 First degree hemorrhoids: Secondary | ICD-10-CM | POA: Insufficient documentation

## 2020-05-25 DIAGNOSIS — G4730 Sleep apnea, unspecified: Secondary | ICD-10-CM | POA: Diagnosis not present

## 2020-05-25 DIAGNOSIS — Z96611 Presence of right artificial shoulder joint: Secondary | ICD-10-CM | POA: Diagnosis not present

## 2020-05-25 DIAGNOSIS — D120 Benign neoplasm of cecum: Secondary | ICD-10-CM | POA: Insufficient documentation

## 2020-05-25 DIAGNOSIS — Z1211 Encounter for screening for malignant neoplasm of colon: Secondary | ICD-10-CM | POA: Insufficient documentation

## 2020-05-25 DIAGNOSIS — K635 Polyp of colon: Secondary | ICD-10-CM

## 2020-05-25 DIAGNOSIS — Z86718 Personal history of other venous thrombosis and embolism: Secondary | ICD-10-CM | POA: Insufficient documentation

## 2020-05-25 DIAGNOSIS — I10 Essential (primary) hypertension: Secondary | ICD-10-CM | POA: Diagnosis not present

## 2020-05-25 DIAGNOSIS — Z79899 Other long term (current) drug therapy: Secondary | ICD-10-CM | POA: Insufficient documentation

## 2020-05-25 HISTORY — PX: COLONOSCOPY WITH PROPOFOL: SHX5780

## 2020-05-25 SURGERY — COLONOSCOPY WITH PROPOFOL
Anesthesia: General

## 2020-05-25 MED ORDER — SODIUM CHLORIDE 0.9 % IV SOLN: INTRAVENOUS | Status: DC

## 2020-05-25 MED ORDER — PROPOFOL 500 MG/50ML IV EMUL
INTRAVENOUS | Status: DC | PRN
  Administered 2020-05-25: 125 ug/kg/min via INTRAVENOUS

## 2020-05-25 MED ORDER — PROPOFOL 500 MG/50ML IV EMUL
INTRAVENOUS | Status: AC
  Filled 2020-05-25: qty 100

## 2020-05-25 MED ORDER — PROPOFOL 500 MG/50ML IV EMUL
INTRAVENOUS | Status: AC
  Filled 2020-05-25: qty 50

## 2020-05-25 MED ORDER — PROPOFOL 10 MG/ML IV BOLUS
INTRAVENOUS | Status: AC
  Filled 2020-05-25: qty 40

## 2020-05-25 MED ORDER — PROPOFOL 10 MG/ML IV BOLUS
INTRAVENOUS | Status: DC | PRN
  Administered 2020-05-25 (×2): 50 mg via INTRAVENOUS

## 2020-05-25 NOTE — H&P (Signed)
Lucilla Lame, MD [address] [place], [place] [address] Phone:[phone] Fax : [phone]  Primary Care Physician:  Albina Billet, MD Primary Gastroenterologist:  Dr. Allen Norris  Pre-Procedure History & Physical: HPI:  Louis Salazar is a 64 y.o. male is here for an colonoscopy.   Past Medical History:  Diagnosis Date  . Arthritis    right shoulder; right knee  . DVT (deep venous thrombosis) ([place]) 01/2018   RIGHT POPLITEAL  . Enlarged prostate   . Foreign body, eye    left eyelid  . Gastritis 10/24/2017  . GERD (gastroesophageal reflux disease)    nissan plication and this has improved  . Hemorrhoid   . History of colon polyps 05/24/2015  . Hypertension    controlled on med  . Neck pain    "EXPERIENCE CRICK IN NECK". comes and goes 2019  . Neuromuscular disorder ([place])    hands are tingly and go numb. possibly carpal tunnel.  right hand  . Pre-diabetes    A1C in 2019 was 6.0  . Substance abuse ([place])    hx of inhalants, last use 2010.  regular  daily alcohol intake (2019)  . Umbilical hernia without obstruction and without gangrene 08/24/2016    Past Surgical History:  Procedure Laterality Date  . ABDOMINAL SURGERY  0277   nissen fundoplication  . BUNIONECTOMY Left 2007  . CARPAL TUNNEL RELEASE Right 03/12/2018   Procedure: CARPAL TUNNEL RELEASE;  Surgeon: Hessie Knows, MD;  Location: [facility];  Service: Orthopedics;  Laterality: Right;  . COLONOSCOPY WITH PROPOFOL N/A 10/18/2015   Procedure: COLONOSCOPY WITH PROPOFOL;  Surgeon: Lucilla Lame, MD;  Location: [place];  Service: Endoscopy;  Laterality: N/A;  . CYSTOSCOPY WITH INSERTION OF UROLIFT N/A 03/11/2020   Procedure: CYSTOSCOPY WITH INSERTION OF UROLIFT;  Surgeon: Royston Cowper, MD;  Location: [facility];  Service: Urology;  Laterality: N/A;  . ESOPHAGOGASTRODUODENOSCOPY (EGD) WITH PROPOFOL N/A 10/18/2015   Procedure: ESOPHAGOGASTRODUODENOSCOPY (EGD) WITH PROPOFOL;  Surgeon: Lucilla Lame, MD;  Location: [place];  Service: Endoscopy;  Laterality: N/A;  wants as early as possible  . HERNIA REPAIR    . IVC FILTER INSERTION N/A 09/24/2018   Procedure: IVC FILTER INSERTION;  Surgeon: [name], MD;  Location: [place] [facility];  Service: Cardiovascular;  Laterality: N/A;  . IVC FILTER REMOVAL N/A 11/12/2018   Procedure: IVC FILTER REMOVAL;  Surgeon: [name], MD;  Location: [place] [facility];  Service: Cardiovascular;  Laterality: N/A;  . KNEE ARTHROPLASTY Right 10/07/2018   Procedure: COMPUTER ASSISTED TOTAL KNEE ARTHROPLASTY;  Surgeon: Dereck Leep, MD;  Location: [facility];  Service: Orthopedics;  Laterality: Right;  . KNEE ARTHROSCOPY Right 02/18/2018   Procedure: ARTHROSCOPY KNEE;  Surgeon: Dereck Leep, MD;  Location: [facility];  Service: Orthopedics;  Laterality: Right;  . KNEE ARTHROSCOPY WITH LATERAL MENISECTOMY Right 02/18/2018   Procedure: KNEE ARTHROSCOPY WITH LATERAL MENISECTOMY;  Surgeon: Dereck Leep, MD;  Location: [facility];  Service: Orthopedics;  Laterality: Right;  . KNEE ARTHROSCOPY WITH MEDIAL MENISECTOMY Right 02/18/2018   Procedure: KNEE ARTHROSCOPY WITH MEDIAL MENISECTOMY;  Surgeon: Dereck Leep, MD;  Location: [facility];  Service: Orthopedics;  Laterality: Right;  . NISSEN FUNDOPLICATION  4128  . SHOULDER SURGERY  04/2016   Right shoulder replacement  . UMBILICAL HERNIA REPAIR N/A 09/12/2016   Procedure: HERNIA REPAIR UMBILICAL ADULT;  Surgeon: Robert Bellow, MD;  Location: [facility];  Service: General;  Laterality: N/A;  . WRIST SURGERY Left 1990   bones fused. metal in wrist    Prior to Admission medications   Medication Sig Start Date End Date Taking? Authorizing Provider  amoxicillin (AMOXIL) 500 MG capsule Take 2,000 mg by mouth See admin instructions. Take 2000 mg 1 hour prior to dental work 08/21/16  Yes [provider]  ascorbic acid (VITAMIN C) 250 MG CHEW Chew 250 mg by mouth daily.    Yes [provider]  clomiPHENE (CLOMID) 50 MG tablet Take 50 mg by mouth daily.    Yes [provider]  loperamide (IMODIUM) 2 MG capsule Take 2 mg by mouth daily.   Yes [provider]  Melatonin-Pyridoxine (MELATIN PO) Take 10 mg by mouth.   Yes [provider]  metoprolol succinate (TOPROL-XL) 25 MG 24 hr tablet Take 1 tablet (25 mg total) by mouth 2 (two) times daily. 03/15/16  Yes Arlis Porta., MD  Multiple Vitamin (MULTIVITAMIN WITH MINERALS) TABS tablet Take 1 tablet by mouth daily.   Yes [provider]  temazepam (RESTORIL) 30 MG capsule Take 30 mg by mouth at bedtime.    Yes [provider]  ELIQUIS 5 MG TABS tablet TAKE 1 TABLET BY MOUTH TWICE DAILY Patient not taking: Reported on 02/25/2020 09/02/18   Schnier, Dolores Lory, MD  finasteride (PROSCAR) 5 MG tablet Take 5 mg by mouth at bedtime.  Patient not taking: Reported on 05/25/2020    [provider]  HYDROcodone-acetaminophen (NORCO) 10-325 MG tablet Take 1 tablet by mouth every 4 (four) hours as needed for moderate pain. Maximum dose per 24 hours -8 pills. Patient not taking: Reported on 04/07/2020 03/11/20   Royston Cowper, MD  Meth-Hyo-M Bl-Na Phos-Ph Sal (URIBEL) 118 MG CAPS Take 1 capsule (118 mg total) by mouth every 6 (six) hours as needed (dysuria). Patient not taking: Reported on 04/07/2020 03/11/20   Royston Cowper, MD  oxymetazoline (AFRIN) 0.05 % nasal spray Place 1 spray into both nostrils 2 (two) times daily as needed for congestion. Patient not taking: Reported on 05/25/2020    [provider]  tamsulosin (FLOMAX) 0.4 MG CAPS capsule Take 0.4 mg by mouth at bedtime.  Patient not taking: Reported on 05/25/2020    [provider]  traMADol (ULTRAM) 50 MG tablet Take 1-2 tablets (50-100 mg total) by mouth every 4 (four) hours as needed for moderate pain. Patient not taking: Reported on 05/25/2020 10/08/18   Watt Climes, PA    Allergies  as of 04/07/2020 - Review Complete 04/07/2020  Allergen Reaction Noted  . Celebrex [celecoxib] Hives and Rash 02/02/2014  . Sulfa antibiotics Other (See Comments) 02/02/2014    Family History  Problem Relation Age of Onset  . Cancer Other   . COPD Mother   . Arthritis Maternal Grandmother   . Cancer Maternal Grandmother   . Diabetes Maternal Grandmother     Social History   Socioeconomic History  . Marital status: Single    Spouse name: Not on file  . Number of children: Not on file  . Years of education: Not on file  . Highest education level: Not on file  Occupational History  . Not on file  Tobacco Use  . Smoking status: Former Smoker    Packs/day: 1.00    Years: 30.00    Pack years: 30.00    Types: Cigarettes    Quit date: 11/28/1995    Years since quitting: 24.5  . Smokeless  tobacco: Current User    Types: Snuff  . Tobacco comment: 1 can of chew in about 3 days.last chew was 9:30 am day of surgery  Vaping Use  . Vaping Use: Never used  Substance and Sexual Activity  . Alcohol use: Yes    Alcohol/week: 24.0 standard drinks    Types: 24 Cans of beer per week    Comment: 24 beers per week  . Drug use: No    Comment: PT WITH H/O INHALENTS-LAST USED 2010  . Sexual activity: Not on file  Other Topics Concern  . Not on file  Social History Narrative  . Not on file   Social Determinants of Health   Financial Resource Strain:   . Difficulty of Paying Living Expenses:   Food Insecurity:   . Worried About Charity fundraiser in the Last Year:   . Arboriculturist in the Last Year:   Transportation Needs:   . Film/video editor (Medical):   Marland Kitchen Lack of Transportation (Non-Medical):   Physical Activity:   . Days of Exercise per Week:   . Minutes of Exercise per Session:   Stress:   . Feeling of Stress :   Social Connections:   . Frequency of Communication with Friends and Family:   . Frequency of Social Gatherings with Friends and Family:   . Attends  Religious Services:   . Active Member of Clubs or Organizations:   . Attends Archivist Meetings:   Marland Kitchen Marital Status:   Intimate Partner Violence:   . Fear of Current or Ex-Partner:   . Emotionally Abused:   Marland Kitchen Physically Abused:   . Sexually Abused:     Review of Systems: See HPI, otherwise negative ROS  Physical Exam: BP 134/88   Pulse 88   Temp (!) 97.4 F (36.3 C) (Temporal)   Resp 18   Ht 5\' 11"  (1.803 m)   Wt 123.4 kg   SpO2 96%   BMI 37.94 kg/m  General:   Alert,  pleasant and cooperative in NAD Head:  Normocephalic and atraumatic. Neck:  Supple; no masses or thyromegaly. Lungs:  Clear throughout to auscultation.    Heart:  Regular rate and rhythm. Abdomen:  Soft, nontender and nondistended. Normal bowel sounds, without guarding, and without rebound.   Neurologic:  Alert and  oriented x4;  grossly normal neurologically.  Impression/Plan: Louis Salazar is here for an colonoscopy to be performed for history of adenomatous colon polyps with last colonoscopy 09/2015  Risks, benefits, limitations, and alternatives regarding  colonoscopy have been reviewed with the patient.  Questions have been answered.  All parties agreeable.   Lucilla Lame, MD  05/25/2020, 7:32 AM

## 2020-05-25 NOTE — Op Note (Signed)
[facility] Gastroenterology Patient Name: Louis Salazar Procedure Date: 05/25/2020 7:57 AM MRN: [id number] Account #: [id number] Date of Birth: 1956-02-08 Admit Type: Outpatient Age: 64 Room: [facility] ENDO ROOM 4 Gender: Male Note Status: Finalized Procedure:             Colonoscopy Indications:           High risk colon cancer surveillance: Personal history                         of colonic polyps Providers:             Lucilla Lame MD, MD Referring MD:          Leona Carry. Hall Busing, MD (Referring MD) Medicines:             Propofol per Anesthesia Complications:         No immediate complications. Procedure:             Pre-Anesthesia Assessment:                        - Prior to the procedure, a History and Physical was                         performed, and patient medications and allergies were                         reviewed. The patient's tolerance of previous                         anesthesia was also reviewed. The risks and benefits                         of the procedure and the sedation options and risks                         were discussed with the patient. All questions were                         answered, and informed consent was obtained. Prior                         Anticoagulants: The patient has taken no previous                         anticoagulant or antiplatelet agents. ASA Grade                         Assessment: II - A patient with mild systemic disease.                         After reviewing the risks and benefits, the patient                         was deemed in satisfactory condition to undergo the                         procedure.  After obtaining informed consent, the colonoscope was                         passed under direct vision. Throughout the procedure,                         the patient's blood pressure, pulse, and oxygen                         saturations were monitored continuously. The                          Colonoscope was introduced through the anus and                         advanced to the the cecum, identified by appendiceal                         orifice and ileocecal valve. The colonoscopy was                         performed without difficulty. The patient tolerated                         the procedure well. The quality of the bowel                         preparation was excellent. Findings:      The perianal and digital rectal examinations were normal.      A 2 mm polyp was found in the cecum. The polyp was sessile. The polyp       was removed with a cold biopsy forceps. Resection and retrieval were       complete.      A 5 mm polyp was found in the sigmoid colon. The polyp was sessile. The       polyp was removed with a cold snare. Resection and retrieval were       complete.      A few small-mouthed diverticula were found in the sigmoid colon.      Non-bleeding internal hemorrhoids were found during retroflexion. The       hemorrhoids were Grade I (internal hemorrhoids that do not prolapse). Impression:            - One 2 mm polyp in the cecum, removed with a cold                         biopsy forceps. Resected and retrieved.                        - One 5 mm polyp in the sigmoid colon, removed with a                         cold snare. Resected and retrieved.                        - Diverticulosis in the sigmoid colon.                        - Non-bleeding internal  hemorrhoids. Recommendation:        - Discharge patient to home.                        - Resume previous diet.                        - Continue present medications.                        - Await pathology results.                        - Repeat colonoscopy in 5 years for surveillance. Procedure Code(s):     --- Professional ---                        [phone], Colonoscopy, flexible; with removal of                         tumor(s), polyp(s), or other lesion(s) by snare                          technique                        45380, 17, Colonoscopy, flexible; with biopsy, single                         or multiple Diagnosis Code(s):     --- Professional ---                        Z86.010, Personal history of colonic polyps                        K63.5, Polyp of colon CPT copyright 2019 American Medical Association. All rights reserved. The codes documented in this report are preliminary and upon coder review may  be revised to meet current compliance requirements. Lucilla Lame MD, MD 05/25/2020 8:17:45 AM This report has been signed electronically. Number of Addenda: 0 Note Initiated On: 05/25/2020 7:57 AM Scope Withdrawal Time: 0 hours 9 minutes 53 seconds  Total Procedure Duration: 0 hours 11 minutes 19 seconds  Estimated Blood Loss:  Estimated blood loss: none.      [facility]

## 2020-05-25 NOTE — Anesthesia Preprocedure Evaluation (Signed)
Anesthesia Evaluation  Patient identified by MRN, date of birth, ID band Patient awake    Reviewed: Allergy & Precautions, H&P , NPO status , Patient's Chart, lab work & pertinent test results, reviewed documented beta blocker date and time   History of Anesthesia Complications Negative for: history of anesthetic complications  Airway Mallampati: III  TM Distance: >3 FB Neck ROM: full   Comment: Large beard Dental  (+) Poor Dentition, Teeth Intact, Dental Advisory Given   Pulmonary neg shortness of breath, sleep apnea , neg recent URI, former smoker,  Not formally diagnosed but patient snores heavily   Pulmonary exam normal        Cardiovascular Exercise Tolerance: Poor hypertension, Pt. on medications (-) angina+ DVT  (-) Past MI Normal cardiovascular exam Rhythm:regular Rate:Normal     Neuro/Psych neg Seizures PSYCHIATRIC DISORDERS Anxiety Depression  Neuromuscular disease    GI/Hepatic Neg liver ROS, GERD  Controlled,S/p Nissen, well controlled now   Endo/Other  Morbid obesity  Renal/GU negative Renal ROS  negative genitourinary   Musculoskeletal   Abdominal   Peds  Hematology negative hematology ROS (+)   Anesthesia Other Findings Past Medical History: No date: Arthritis     Comment:  right shoulder; right knee 02/28/2016: Depression 01/2018: DVT (deep venous thrombosis) ([place])     Comment:  RIGHT POPLITEAL No date: Enlarged prostate No date: Foreign body, eye     Comment:  left eyelid 10/24/2017: Gastritis No date: GERD (gastroesophageal reflux disease)     Comment:  nissan plication and this has improved No date: Hemorrhoid 05/24/2015: History of colon polyps No date: Hypertension     Comment:  controlled on med No date: Neck pain     Comment:  "EXPERIENCE CRICK IN NECK". comes and goes 2019 No date: Neuromuscular disorder ([place])     Comment:  hands are tingly and go numb. possibly carpal tunnel.                 right hand No date: Pre-diabetes     Comment:  A1C in 2019 was 6.0 No date: Substance abuse ([place])     Comment:  hx of inhalants, last use 2010.  regular  daily alcohol               intake (2019) 19/50/9326: Umbilical hernia without obstruction and without gangrene Past Surgical History: 1999: ABDOMINAL SURGERY     Comment:  nissen fundoplication 7124: BUNIONECTOMY; Left 03/12/2018: CARPAL TUNNEL RELEASE; Right     Comment:  Procedure: CARPAL TUNNEL RELEASE;  Surgeon: Hessie Knows, MD;  Location: [facility];  Service: Orthopedics;               Laterality: Right; 10/18/2015: COLONOSCOPY WITH PROPOFOL; N/A     Comment:  Procedure: COLONOSCOPY WITH PROPOFOL;  Surgeon: Lucilla Lame, MD;  Location: [place];  Service:               Endoscopy;  Laterality: N/A; 10/18/2015: ESOPHAGOGASTRODUODENOSCOPY (EGD) WITH PROPOFOL; N/A     Comment:  Procedure: ESOPHAGOGASTRODUODENOSCOPY (EGD) WITH               PROPOFOL;  Surgeon: Lucilla Lame, MD;  Location: [place];  Service: Endoscopy;  Laterality: N/A;  wants as early as possible No date: HERNIA REPAIR 09/24/2018: IVC FILTER INSERTION; N/A     Comment:  Procedure: IVC FILTER INSERTION;  Surgeon: [name], MD;  Location: [place] [facility];  Service:              Cardiovascular;  Laterality: N/A; 02/18/2018: KNEE ARTHROSCOPY; Right     Comment:  Procedure: ARTHROSCOPY KNEE;  Surgeon: Dereck Leep,               MD;  Location: [facility];  Service: Orthopedics;                Laterality: Right; 02/18/2018: KNEE ARTHROSCOPY WITH LATERAL MENISECTOMY; Right     Comment:  Procedure: KNEE ARTHROSCOPY WITH LATERAL MENISECTOMY;                Surgeon: Dereck Leep, MD;  Location: [facility];                Service: Orthopedics;  Laterality: Right; 02/18/2018: KNEE ARTHROSCOPY WITH MEDIAL MENISECTOMY; Right     Comment:  Procedure: KNEE  ARTHROSCOPY WITH MEDIAL MENISECTOMY;                Surgeon: Dereck Leep, MD;  Location: [facility];                Service: Orthopedics;  Laterality: Right; 3086: NISSEN FUNDOPLICATION 57/8469: SHOULDER SURGERY     Comment:  Right shoulder replacement 62/95/2841: UMBILICAL HERNIA REPAIR; N/A     Comment:  Procedure: HERNIA REPAIR UMBILICAL ADULT;  Surgeon:               Robert Bellow, MD;  Location: [facility];  Service:               General;  Laterality: N/A; 1990: WRIST SURGERY; Left     Comment:  bones fused. metal in wrist   Reproductive/Obstetrics negative OB ROS                             Anesthesia Physical  Anesthesia Plan  ASA: III  Anesthesia Plan: General   Post-op Pain Management:    Induction: Intravenous  PONV Risk Score and Plan: 3 and Propofol infusion and TIVA  Airway Management Planned: Natural Airway and Nasal Cannula  Additional Equipment: None  Intra-op Plan:   Post-operative Plan:   Informed Consent: I have reviewed the patients History and Physical, chart, labs and discussed the procedure including the risks, benefits and alternatives for the proposed anesthesia with the patient or authorized representative who has indicated his/her understanding and acceptance.     Dental Advisory Given  Plan Discussed with: CRNA  Anesthesia Plan Comments: (Discussed risks of anesthesia with patient, including PONV, sore throat, lip/dental damage. Rare risks discussed as well, such as cardiorespiratory and neurological sequelae. Patient understands.)        Anesthesia Quick Evaluation

## 2020-05-25 NOTE — Anesthesia Postprocedure Evaluation (Signed)
Anesthesia Post Note  Patient: JOBAN COLLEDGE  Procedure(s) Performed: COLONOSCOPY WITH PROPOFOL (N/A )  Patient location during evaluation: Endoscopy Anesthesia Type: General Level of consciousness: awake and alert Pain management: pain level controlled Vital Signs Assessment: post-procedure vital signs reviewed and stable Respiratory status: spontaneous breathing, nonlabored ventilation, respiratory function stable and patient connected to nasal cannula oxygen Cardiovascular status: blood pressure returned to baseline and stable Postop Assessment: no apparent nausea or vomiting Anesthetic complications: no   No complications documented.   Last Vitals:  Vitals:   05/25/20 0829 05/25/20 0839  BP: 118/82 122/85  Pulse: 93 75  Resp: 20 19  Temp:    SpO2: 95% 96%    Last Pain:  Vitals:   05/25/20 0839  TempSrc:   PainSc: 0-No pain                 Martha Clan

## 2020-05-25 NOTE — Transfer of Care (Signed)
Immediate Anesthesia Transfer of Care Note  Patient: Louis Salazar  Procedure(s) Performed: COLONOSCOPY WITH PROPOFOL (N/A )  Patient Location: PACU  Anesthesia Type:General  Level of Consciousness: awake, alert  and oriented  Airway & Oxygen Therapy: Patient Spontanous Breathing  Post-op Assessment: Report given to RN and Post -op Vital signs reviewed and stable  Post vital signs: Reviewed and stable  Last Vitals:  Vitals Value Taken Time  BP    Temp    Pulse    Resp    SpO2      Last Pain:  Vitals:   05/25/20 0718  TempSrc: Temporal  PainSc: 0-No pain         Complications: No complications documented.

## 2020-05-26 ENCOUNTER — Encounter: Payer: Self-pay | Admitting: Gastroenterology

## 2020-05-26 LAB — SURGICAL PATHOLOGY

## 2021-05-02 ENCOUNTER — Other Ambulatory Visit: Payer: Self-pay | Admitting: Physician Assistant

## 2021-05-02 ENCOUNTER — Ambulatory Visit (INDEPENDENT_AMBULATORY_CARE_PROVIDER_SITE_OTHER): Payer: Self-pay

## 2021-05-02 ENCOUNTER — Other Ambulatory Visit: Payer: Self-pay

## 2021-05-02 DIAGNOSIS — M25531 Pain in right wrist: Secondary | ICD-10-CM

## 2021-05-02 DIAGNOSIS — W19XXXA Unspecified fall, initial encounter: Secondary | ICD-10-CM

## 2021-07-14 ENCOUNTER — Other Ambulatory Visit: Payer: Self-pay | Admitting: Orthopedic Surgery

## 2021-07-14 DIAGNOSIS — Z01811 Encounter for preprocedural respiratory examination: Secondary | ICD-10-CM

## 2021-07-19 NOTE — Progress Notes (Signed)
DUE TO COVID-19 ONLY ONE VISITOR IS ALLOWED TO COME WITH YOU AND STAY IN THE WAITING ROOM ONLY DURING PRE OP AND PROCEDURE DAY OF SURGERY.  2 VISITOR  MAY VISIT WITH YOU AFTER SURGERY IN YOUR PRIVATE ROOM DURING VISITING HOURS ONLY!  YOU NEED TO HAVE A COVID 19 TEST ON______AME'@_'$  '@_from'$  8am-3pm _____, THIS TEST MUST BE DONE BEFORE SURGERY,  Covid test is done at [place], [place] [address].  This is a drive thru.  No appt required. Please see map.                 Your procedure is scheduled on:  08/04/2021   Report to [facility]  Entrance   Report to admitting at     [phone]     Call this number if you have problems the morning of surgery [phone]    REMEMBER: NO  SOLID FOOD CANDY OR GUM AFTER MIDNIGHT. CLEAR LIQUIDS UNTIL     0645am      . NOTHING BY MOUTH EXCEPT CLEAR LIQUIDS UNTIL      0645am  . PLEASE FINISH ENSURE DRINK PER SURGEON ORDER  WHICH NEEDS TO BE COMPLETED AT 0645am      .      CLEAR LIQUID DIET   Foods Allowed                                                                    Coffee and tea, regular and decaf                            Fruit ices (not with fruit pulp)                                      Iced Popsicles                                    Carbonated beverages, regular and diet                                    Cranberry, grape and apple juices Sports drinks like Gatorade Lightly seasoned clear broth or consume(fat free) Sugar, honey syrup ___________________________________________________________________      BRUSH YOUR TEETH MORNING OF SURGERY AND RINSE YOUR MOUTH OUT, NO CHEWING GUM CANDY OR MINTS.     Take these medicines the morning of surgery with A SIP OF WATER:  toprol   DO NOT TAKE ANY DIABETIC MEDICATIONS DAY OF YOUR SURGERY                               You may not have any metal on your body including hair pins and              piercings  Do not wear jewelry, make-up, lotions, powders or  perfumes, deodorant  Do not wear nail polish on your fingernails.  Do not shave  48 hours prior to surgery.              Men may shave face and neck.   Do not bring valuables to the hospital. [place].  Contacts, dentures or bridgework may not be worn into surgery.  Leave suitcase in the car. After surgery it may be brought to your room.     Patients discharged the day of surgery will not be allowed to drive home. IF YOU ARE HAVING SURGERY AND GOING HOME THE SAME DAY, YOU MUST HAVE AN ADULT TO DRIVE YOU HOME AND BE WITH YOU FOR 24 HOURS. YOU MAY GO HOME BY TAXI OR UBER OR ORTHERWISE, BUT AN ADULT MUST ACCOMPANY YOU HOME AND STAY WITH YOU FOR 24 HOURS.  Name and phone number of your driver:  Special Instructions: N/A              Please read over the following fact sheets you were given: _____________________________________________________________________  [facility] - Preparing for Surgery Before surgery, you can play an important role.  Because skin is not sterile, your skin needs to be as free of germs as possible.  You can reduce the number of germs on your skin by washing with CHG (chlorahexidine gluconate) soap before surgery.  CHG is an antiseptic cleaner which kills germs and bonds with the skin to continue killing germs even after washing. Please DO NOT use if you have an allergy to CHG or antibacterial soaps.  If your skin becomes reddened/irritated stop using the CHG and inform your nurse when you arrive at Short Stay. Do not shave (including legs and underarms) for at least 48 hours prior to the first CHG shower.  You may shave your face/neck. Please follow these instructions carefully:  1.  Shower with CHG Soap the night before surgery and the  morning of Surgery.  2.  If you choose to wash your hair, wash your hair first as usual with your  normal  shampoo.  3.  After you shampoo, rinse your hair and body thoroughly  to remove the  shampoo.                           4.  Use CHG as you would any other liquid soap.  You can apply chg directly  to the skin and wash                       Gently with a scrungie or clean washcloth.  5.  Apply the CHG Soap to your body ONLY FROM THE NECK DOWN.   Do not use on face/ open                           Wound or open sores. Avoid contact with eyes, ears mouth and genitals (private parts).                       Wash face,  Genitals (private parts) with your normal soap.             6.  Wash thoroughly, paying special attention to the area where your surgery  will be performed.  7.  Thoroughly rinse your body with  warm water from the neck down.  8.  DO NOT shower/wash with your normal soap after using and rinsing off  the CHG Soap.                9.  Pat yourself dry with a clean towel.            10.  Wear clean pajamas.            11.  Place clean sheets on your bed the night of your first shower and do not  sleep with pets. Day of Surgery : Do not apply any lotions/deodorants the morning of surgery.  Please wear clean clothes to the hospital/surgery center.  FAILURE TO FOLLOW THESE INSTRUCTIONS MAY RESULT IN THE CANCELLATION OF YOUR SURGERY PATIENT SIGNATURE_________________________________  NURSE SIGNATURE__________________________________  ________________________________________________________________________              [facility]- Preparing for Total Shoulder Arthroplasty    Before surgery, you can play an important role. Because skin is not sterile, your skin needs to be as free of germs as possible. You can reduce the number of germs on your skin by using the following products. Benzoyl Peroxide Gel Reduces the number of germs present on the skin Applied twice a day to shoulder area starting two days before surgery    ==================================================================  Please follow these instructions carefully:  BENZOYL PEROXIDE 5%  GEL  Please do not use if you have an allergy to benzoyl peroxide.   If your skin becomes reddened/irritated stop using the benzoyl peroxide.  Starting two days before surgery, apply as follows: Apply benzoyl peroxide in the morning and at night. Apply after taking a shower. If you are not taking a shower clean entire shoulder front, back, and side along with the armpit with a clean wet washcloth.  Place a quarter-sized dollop on your shoulder and rub in thoroughly, making sure to cover the front, back, and side of your shoulder, along with the armpit.   2 days before ____ AM   ____ PM              1 day before ____ AM   ____ PM                         Do this twice a day for two days.  (Last application is the night before surgery, AFTER using the CHG soap as described below).  Do NOT apply benzoyl peroxide gel on the day of surgery.

## 2021-07-22 ENCOUNTER — Other Ambulatory Visit: Payer: Self-pay

## 2021-07-22 ENCOUNTER — Ambulatory Visit (HOSPITAL_COMMUNITY)
Admission: RE | Admit: 2021-07-22 | Discharge: 2021-07-22 | Disposition: A | Discharge status: Home / Self Care | Payer: BC Managed Care – PPO | Source: Ambulatory Visit | Attending: Orthopedic Surgery | Admitting: Orthopedic Surgery

## 2021-07-22 ENCOUNTER — Encounter (HOSPITAL_COMMUNITY): Payer: Self-pay

## 2021-07-22 ENCOUNTER — Encounter (HOSPITAL_COMMUNITY)
Admission: RE | Admit: 2021-07-22 | Discharge: 2021-07-22 | Disposition: A | Discharge status: Home / Self Care | Payer: BC Managed Care – PPO | Source: Ambulatory Visit | Attending: Orthopedic Surgery | Admitting: Orthopedic Surgery

## 2021-07-22 DIAGNOSIS — Z01818 Encounter for other preprocedural examination: Secondary | ICD-10-CM | POA: Diagnosis present

## 2021-07-22 DIAGNOSIS — Z01811 Encounter for preprocedural respiratory examination: Secondary | ICD-10-CM | POA: Diagnosis not present

## 2021-07-22 LAB — SURGICAL PCR SCREEN
MRSA, PCR: NEGATIVE
Staphylococcus aureus: NEGATIVE

## 2021-07-22 LAB — URINALYSIS, ROUTINE W REFLEX MICROSCOPIC
Bilirubin Urine: NEGATIVE
Glucose, UA: NEGATIVE mg/dL
Hgb urine dipstick: NEGATIVE
Ketones, ur: 5 mg/dL — AB
Leukocytes,Ua: NEGATIVE
Nitrite: NEGATIVE
Protein, ur: NEGATIVE mg/dL
Specific Gravity, Urine: 1.02 (ref 1.005–1.030)
pH: 5 (ref 5.0–8.0)

## 2021-07-22 LAB — COMPREHENSIVE METABOLIC PANEL
ALT: 12 U/L (ref 0–44)
AST: 21 U/L (ref 15–41)
Albumin: 3.9 g/dL (ref 3.5–5.0)
Alkaline Phosphatase: 42 U/L (ref 38–126)
Anion gap: 6 (ref 5–15)
BUN: 28 mg/dL — ABNORMAL HIGH (ref 8–23)
CO2: 27 mmol/L (ref 22–32)
Calcium: 8.6 mg/dL — ABNORMAL LOW (ref 8.9–10.3)
Chloride: 106 mmol/L (ref 98–111)
Creatinine, Ser: 0.92 mg/dL (ref 0.61–1.24)
GFR, Estimated: >60 mL/min (ref >60)
Glucose, Bld: 128 mg/dL — ABNORMAL HIGH (ref 70–99)
Potassium: 4.2 mmol/L (ref 3.5–5.1)
Sodium: 139 mmol/L (ref 135–145)
Total Bilirubin: 0.5 mg/dL (ref 0.3–1.2)
Total Protein: 6.9 g/dL (ref 6.5–8.1)

## 2021-07-22 LAB — TYPE AND SCREEN
ABO/RH(D): A NEG
Antibody Screen: NEGATIVE

## 2021-07-22 LAB — CBC WITH DIFFERENTIAL/PLATELET
Abs Immature Granulocytes: 0.04 10*3/uL (ref 0.00–0.07)
Basophils Absolute: 0.1 10*3/uL (ref 0.0–0.1)
Basophils Relative: 1 %
Eosinophils Absolute: 0.2 10*3/uL (ref 0.0–0.5)
Eosinophils Relative: 3 %
HCT: 46.6 % (ref 39.0–52.0)
Hemoglobin: 14.7 g/dL (ref 13.0–17.0)
Immature Granulocytes: 1 %
Lymphocytes Relative: 24 %
Lymphs Abs: 1.7 10*3/uL (ref 0.7–4.0)
MCH: 28.5 pg (ref 26.0–34.0)
MCHC: 31.5 g/dL (ref 30.0–36.0)
MCV: 90.3 fL (ref 80.0–100.0)
Monocytes Absolute: 0.6 10*3/uL (ref 0.1–1.0)
Monocytes Relative: 9 %
Neutro Abs: 4.4 10*3/uL (ref 1.7–7.7)
Neutrophils Relative %: 62 %
Platelets: 185 10*3/uL (ref 150–400)
RBC: 5.16 MIL/uL (ref 4.22–5.81)
RDW: 14.4 % (ref 11.5–15.5)
WBC: 7 10*3/uL (ref 4.0–10.5)
nRBC: 0 % (ref 0.0–0.2)

## 2021-07-22 LAB — APTT: aPTT: 30 seconds (ref 24–36)

## 2021-07-22 LAB — HEMOGLOBIN A1C
Hgb A1c MFr Bld: 6.3 % — ABNORMAL HIGH (ref 4.8–5.6)
Mean Plasma Glucose: 134.11 mg/dL

## 2021-07-22 NOTE — Progress Notes (Addendum)
Anesthesia Review:  PCP: DR Laurence Ferrari, [place]- faxed labs to PCP office on 07/22/21.   LOV note with labs   03/18/21 on chart.  Cardiologist : Chest x-ray : 07/22/21  EKG : 07/22/21  Echo : Stress test: Cardiac Cath :  Activity level: can do flight of stairs without difficulty  Sleep Study/ CPAP : none  Fasting Blood Sugar :      / Checks Blood Sugar -- times a day:   Blood Thinner/ Instructions /Last Dose: ASA / Instructions/ Last Dose :   No covid test required  HGBA1c-07/22/21- 6.3.   When asked diabetic questions was negative on diabetes but in PCP notes states prediabetes.   U/A done 07/22/21 routed to Dr Tamera Punt.  PT reports at preop that he had chest pain 20 plus years ago and was evaluated by a cardiologist and was negative per pt.  Pt in a right foot boot at preop.  Reports he had right tendon tear 05/2021.   Prediabetes

## 2021-07-26 ENCOUNTER — Other Ambulatory Visit: Payer: Self-pay | Admitting: Orthopedic Surgery

## 2021-07-26 DIAGNOSIS — M19012 Primary osteoarthritis, left shoulder: Secondary | ICD-10-CM

## 2021-07-29 ENCOUNTER — Ambulatory Visit
Admission: RE | Admit: 2021-07-29 | Discharge: 2021-07-29 | Disposition: A | Discharge status: Home / Self Care | Payer: BC Managed Care – PPO | Source: Ambulatory Visit | Attending: Orthopedic Surgery | Admitting: Orthopedic Surgery

## 2021-07-29 DIAGNOSIS — M19012 Primary osteoarthritis, left shoulder: Secondary | ICD-10-CM

## 2021-08-03 NOTE — Anesthesia Preprocedure Evaluation (Addendum)
Anesthesia Evaluation  Patient identified by MRN, date of birth, ID band Patient awake    Reviewed: Allergy & Precautions, NPO status , Patient's Chart, lab work & pertinent test results, reviewed documented beta blocker date and time   Airway Mallampati: III  TM Distance: >3 FB Neck ROM: Full    Dental  (+) Chipped   Pulmonary former smoker,    Pulmonary exam normal breath sounds clear to auscultation       Cardiovascular hypertension, Pt. on home beta blockers + DVT  Normal cardiovascular exam Rhythm:Regular Rate:Normal  ECG: NSR, rate 72   Neuro/Psych PSYCHIATRIC DISORDERS Depression negative neurological ROS     GI/Hepatic negative GI ROS, (+)     substance abuse  alcohol use,   Endo/Other  negative endocrine ROS  Renal/GU negative Renal ROS     Musculoskeletal  (+) Arthritis ,   Abdominal (+) + obese,   Peds  Hematology negative hematology ROS (+)   Anesthesia Other Findings LEFT SHOULDER OSTEOARTHRITIS  Reproductive/Obstetrics                            Anesthesia Physical Anesthesia Plan  ASA: 2  Anesthesia Plan: General and Regional   Post-op Pain Management: GA combined w/ Regional for post-op pain   Induction: Intravenous  PONV Risk Score and Plan: 2 and Ondansetron, Dexamethasone, Midazolam and Treatment may vary due to age or medical condition  Airway Management Planned: Oral ETT and Video Laryngoscope Planned  Additional Equipment:   Intra-op Plan:   Post-operative Plan: Extubation in OR  Informed Consent: I have reviewed the patients History and Physical, chart, labs and discussed the procedure including the risks, benefits and alternatives for the proposed anesthesia with the patient or authorized representative who has indicated his/her understanding and acceptance.     Dental advisory given  Plan Discussed with: CRNA  Anesthesia Plan Comments:          Anesthesia Quick Evaluation

## 2021-08-04 ENCOUNTER — Ambulatory Visit (HOSPITAL_COMMUNITY)
Admission: RE | Admit: 2021-08-04 | Discharge: 2021-08-04 | Disposition: A | Discharge status: Home / Self Care | Payer: BC Managed Care – PPO | Source: Ambulatory Visit | Attending: Orthopedic Surgery | Admitting: Orthopedic Surgery

## 2021-08-04 ENCOUNTER — Encounter (HOSPITAL_COMMUNITY): Payer: Self-pay | Admitting: Orthopedic Surgery

## 2021-08-04 ENCOUNTER — Ambulatory Visit (HOSPITAL_COMMUNITY): Payer: BC Managed Care – PPO

## 2021-08-04 ENCOUNTER — Ambulatory Visit (HOSPITAL_COMMUNITY): Payer: BC Managed Care – PPO | Admitting: Physician Assistant

## 2021-08-04 ENCOUNTER — Ambulatory Visit (HOSPITAL_COMMUNITY): Payer: BC Managed Care – PPO | Admitting: Certified Registered Nurse Anesthetist

## 2021-08-04 ENCOUNTER — Encounter (HOSPITAL_COMMUNITY)
Admission: RE | Disposition: A | Discharge status: Home / Self Care | Payer: Self-pay | Source: Ambulatory Visit | Attending: Orthopedic Surgery

## 2021-08-04 DIAGNOSIS — Z96612 Presence of left artificial shoulder joint: Secondary | ICD-10-CM

## 2021-08-04 DIAGNOSIS — Z96651 Presence of right artificial knee joint: Secondary | ICD-10-CM | POA: Insufficient documentation

## 2021-08-04 DIAGNOSIS — Z96611 Presence of right artificial shoulder joint: Secondary | ICD-10-CM | POA: Insufficient documentation

## 2021-08-04 DIAGNOSIS — Z79899 Other long term (current) drug therapy: Secondary | ICD-10-CM | POA: Diagnosis not present

## 2021-08-04 DIAGNOSIS — M19012 Primary osteoarthritis, left shoulder: Secondary | ICD-10-CM | POA: Insufficient documentation

## 2021-08-04 DIAGNOSIS — Z882 Allergy status to sulfonamides status: Secondary | ICD-10-CM | POA: Diagnosis not present

## 2021-08-04 DIAGNOSIS — Z886 Allergy status to analgesic agent status: Secondary | ICD-10-CM | POA: Diagnosis not present

## 2021-08-04 DIAGNOSIS — Z87891 Personal history of nicotine dependence: Secondary | ICD-10-CM | POA: Insufficient documentation

## 2021-08-04 HISTORY — PX: TOTAL SHOULDER ARTHROPLASTY: SHX126

## 2021-08-04 SURGERY — ARTHROPLASTY, SHOULDER, TOTAL
Anesthesia: General | Site: Shoulder | Laterality: Left

## 2021-08-04 MED ORDER — FENTANYL CITRATE PF 50 MCG/ML IJ SOSY
PREFILLED_SYRINGE | INTRAMUSCULAR | Status: AC
  Filled 2021-08-04: qty 1

## 2021-08-04 MED ORDER — ONDANSETRON HCL 4 MG/2ML IJ SOLN
INTRAMUSCULAR | Status: DC | PRN
  Administered 2021-08-04: 4 mg via INTRAVENOUS

## 2021-08-04 MED ORDER — SODIUM CHLORIDE 0.9 % IR SOLN
Status: DC | PRN
  Administered 2021-08-04: 1000 mL

## 2021-08-04 MED ORDER — PROPOFOL 10 MG/ML IV BOLUS
INTRAVENOUS | Status: AC
  Filled 2021-08-04: qty 20

## 2021-08-04 MED ORDER — 0.9 % SODIUM CHLORIDE (POUR BTL) OPTIME
TOPICAL | Status: DC | PRN
  Administered 2021-08-04: 1000 mL

## 2021-08-04 MED ORDER — ONDANSETRON HCL 4 MG/2ML IJ SOLN
INTRAMUSCULAR | Status: AC
  Filled 2021-08-04: qty 2

## 2021-08-04 MED ORDER — AMISULPRIDE (ANTIEMETIC) 5 MG/2ML IV SOLN: 10.0000 mg | Freq: Once | INTRAVENOUS | Status: DC | PRN

## 2021-08-04 MED ORDER — OXYCODONE-ACETAMINOPHEN 5-325 MG PO TABS: 1.0000 | ORAL_TABLET | Freq: Four times a day (QID) | ORAL | 0 refills | Status: AC | PRN

## 2021-08-04 MED ORDER — PHENYLEPHRINE 40 MCG/ML (10ML) SYRINGE FOR IV PUSH (FOR BLOOD PRESSURE SUPPORT)
PREFILLED_SYRINGE | INTRAVENOUS | Status: DC | PRN
  Administered 2021-08-04: 80 ug via INTRAVENOUS

## 2021-08-04 MED ORDER — PHENYLEPHRINE HCL-NACL 20-0.9 MG/250ML-% IV SOLN
INTRAVENOUS | Status: DC | PRN
  Administered 2021-08-04: 30 ug/min via INTRAVENOUS

## 2021-08-04 MED ORDER — METHOCARBAMOL 500 MG IVPB - SIMPLE MED
INTRAVENOUS | Status: AC
  Administered 2021-08-04: 500 mg via INTRAVENOUS
  Filled 2021-08-04: qty 50

## 2021-08-04 MED ORDER — METHOCARBAMOL 500 MG PO TABS: 500.0000 mg | ORAL_TABLET | Freq: Four times a day (QID) | ORAL | Status: DC | PRN

## 2021-08-04 MED ORDER — OXYCODONE HCL 5 MG PO TABS: 10.0000 mg | ORAL_TABLET | ORAL | Status: DC | PRN

## 2021-08-04 MED ORDER — METHOCARBAMOL 500 MG IVPB - SIMPLE MED: 500.0000 mg | Freq: Four times a day (QID) | INTRAVENOUS | Status: DC | PRN

## 2021-08-04 MED ORDER — ROCURONIUM BROMIDE 10 MG/ML (PF) SYRINGE
PREFILLED_SYRINGE | INTRAVENOUS | Status: DC | PRN
Start: 2021-08-04 — End: 2021-08-04
  Administered 2021-08-04: 60 mg via INTRAVENOUS

## 2021-08-04 MED ORDER — BUPIVACAINE HCL (PF) 0.5 % IJ SOLN
INTRAMUSCULAR | Status: DC | PRN
  Administered 2021-08-04: 15 mL via PERINEURAL

## 2021-08-04 MED ORDER — TRANEXAMIC ACID-NACL 1000-0.7 MG/100ML-% IV SOLN
1000.0000 mg | INTRAVENOUS | Status: AC
  Administered 2021-08-04: 1000 mg via INTRAVENOUS
  Filled 2021-08-04: qty 100

## 2021-08-04 MED ORDER — OXYCODONE HCL 5 MG PO TABS: 5.0000 mg | ORAL_TABLET | Freq: Once | ORAL | Status: DC | PRN

## 2021-08-04 MED ORDER — WATER FOR IRRIGATION, STERILE IR SOLN
Status: DC | PRN
  Administered 2021-08-04: 2000 mL

## 2021-08-04 MED ORDER — BUPIVACAINE LIPOSOME 1.3 % IJ SUSP
INTRAMUSCULAR | Status: DC | PRN
  Administered 2021-08-04: 10 mL via PERINEURAL

## 2021-08-04 MED ORDER — OXYCODONE HCL 5 MG PO TABS: 5.0000 mg | ORAL_TABLET | ORAL | Status: DC | PRN

## 2021-08-04 MED ORDER — OXYCODONE HCL 5 MG/5ML PO SOLN
5.0000 mg | Freq: Once | ORAL | Status: DC | PRN
Start: 2021-08-04 — End: 2021-08-06

## 2021-08-04 MED ORDER — LACTATED RINGERS IV SOLN: INTRAVENOUS | Status: DC

## 2021-08-04 MED ORDER — PROMETHAZINE HCL 25 MG/ML IJ SOLN: 6.2500 mg | INTRAMUSCULAR | Status: DC | PRN

## 2021-08-04 MED ORDER — CEFAZOLIN SODIUM-DEXTROSE 2-4 GM/100ML-% IV SOLN
2.0000 g | INTRAVENOUS | Status: AC
  Administered 2021-08-04: 2 g via INTRAVENOUS
  Filled 2021-08-04: qty 100

## 2021-08-04 MED ORDER — FENTANYL CITRATE (PF) 100 MCG/2ML IJ SOLN
INTRAMUSCULAR | Status: AC
  Filled 2021-08-04: qty 2

## 2021-08-04 MED ORDER — ROCURONIUM BROMIDE 10 MG/ML (PF) SYRINGE
PREFILLED_SYRINGE | INTRAVENOUS | Status: AC
  Filled 2021-08-04: qty 10

## 2021-08-04 MED ORDER — SUGAMMADEX SODIUM 200 MG/2ML IV SOLN
INTRAVENOUS | Status: DC | PRN
  Administered 2021-08-04: 200 mg via INTRAVENOUS

## 2021-08-04 MED ORDER — MIDAZOLAM HCL 2 MG/2ML IJ SOLN
INTRAMUSCULAR | Status: AC
  Filled 2021-08-04: qty 2

## 2021-08-04 MED ORDER — PROPOFOL 10 MG/ML IV BOLUS
INTRAVENOUS | Status: DC | PRN
  Administered 2021-08-04: 200 mg via INTRAVENOUS
  Administered 2021-08-04: 20 mg via INTRAVENOUS

## 2021-08-04 MED ORDER — FENTANYL CITRATE PF 50 MCG/ML IJ SOSY
25.0000 ug | PREFILLED_SYRINGE | INTRAMUSCULAR | Status: DC | PRN
  Administered 2021-08-04 (×2): 50 ug via INTRAVENOUS

## 2021-08-04 MED ORDER — DEXAMETHASONE SODIUM PHOSPHATE 10 MG/ML IJ SOLN
INTRAMUSCULAR | Status: DC | PRN
  Administered 2021-08-04: 8 mg via INTRAVENOUS

## 2021-08-04 MED ORDER — TIZANIDINE HCL 2 MG PO TABS: 2.0000 mg | ORAL_TABLET | Freq: Every day | ORAL | 0 refills | Status: AC

## 2021-08-04 MED ORDER — MIDAZOLAM HCL 5 MG/5ML IJ SOLN
INTRAMUSCULAR | Status: DC | PRN
  Administered 2021-08-04: 2 mg via INTRAVENOUS

## 2021-08-04 MED ORDER — EPHEDRINE SULFATE-NACL 50-0.9 MG/10ML-% IV SOSY
PREFILLED_SYRINGE | INTRAVENOUS | Status: DC | PRN
  Administered 2021-08-04 (×2): 2.5 mg via INTRAVENOUS

## 2021-08-04 MED ORDER — FENTANYL CITRATE PF 50 MCG/ML IJ SOSY
50.0000 ug | PREFILLED_SYRINGE | INTRAMUSCULAR | Status: DC
  Administered 2021-08-04: 100 ug via INTRAVENOUS
  Filled 2021-08-04: qty 2

## 2021-08-04 MED ORDER — ACETAMINOPHEN 500 MG PO TABS
1000.0000 mg | ORAL_TABLET | Freq: Once | ORAL | Status: AC
  Administered 2021-08-04: 1000 mg via ORAL
  Filled 2021-08-04: qty 2

## 2021-08-04 MED ORDER — DEXAMETHASONE SODIUM PHOSPHATE 10 MG/ML IJ SOLN
INTRAMUSCULAR | Status: AC
  Filled 2021-08-04: qty 1

## 2021-08-04 MED ORDER — FENTANYL CITRATE (PF) 100 MCG/2ML IJ SOLN
INTRAMUSCULAR | Status: DC | PRN
  Administered 2021-08-04: 100 ug via INTRAVENOUS

## 2021-08-04 MED ORDER — MIDAZOLAM HCL 2 MG/2ML IJ SOLN
1.0000 mg | INTRAMUSCULAR | Status: DC
  Administered 2021-08-04: 2 mg via INTRAVENOUS
  Filled 2021-08-04: qty 2

## 2021-08-04 SURGICAL SUPPLY — 67 items
AID PSTN UNV HD RSTRNT DISP (MISCELLANEOUS) ×1
BAG COUNTER SPONGE SURGICOUNT (BAG) IMPLANT
BAG SPEC THK2 15X12 ZIP CLS (MISCELLANEOUS) ×1
BAG SPNG CNTER NS LX DISP (BAG)
BAG ZIPLOCK 12X15 (MISCELLANEOUS) ×2 IMPLANT
BIT DRILL 1.6MX128 (BIT) ×2 IMPLANT
BLADE SAW SAG 73X25 THK (BLADE) ×1
BLADE SAW SGTL 73X25 THK (BLADE) ×1 IMPLANT
CEMENT BONE DEPUY (Cement) ×2 IMPLANT
COOLER ICEMAN CLASSIC (MISCELLANEOUS) IMPLANT
COVER BACK TABLE 60X90IN (DRAPES) ×2 IMPLANT
COVER SURGICAL LIGHT HANDLE (MISCELLANEOUS) ×2 IMPLANT
DRAPE INCISE IOBAN 66X45 STRL (DRAPES) ×2 IMPLANT
DRAPE ORTHO SPLIT 77X108 STRL (DRAPES) ×4
DRAPE POUCH INSTRU U-SHP 10X18 (DRAPES) ×2 IMPLANT
DRAPE SURG 17X11 SM STRL (DRAPES) ×2 IMPLANT
DRAPE SURG ORHT 6 SPLT 77X108 (DRAPES) ×2 IMPLANT
DRAPE TOP 10253 STERILE (DRAPES) ×2 IMPLANT
DRAPE U-SHAPE 47X51 STRL (DRAPES) ×2 IMPLANT
DRSG AQUACEL AG ADV 3.5X 6 (GAUZE/BANDAGES/DRESSINGS) ×2 IMPLANT
DURAPREP 26ML APPLICATOR (WOUND CARE) ×4 IMPLANT
ELECT BLADE TIP CTD 4 INCH (ELECTRODE) ×2 IMPLANT
ELECT REM PT RETURN 15FT ADLT (MISCELLANEOUS) ×2 IMPLANT
GLENOID CORTILOC AEQUALIS L40 (Shoulder) ×1 IMPLANT
GLOVE SRG 8 PF TXTR STRL LF DI (GLOVE) ×1 IMPLANT
GLOVE SURG ENC MOIS LTX SZ7.5 (GLOVE) ×2 IMPLANT
GLOVE SURG POLYISO LF SZ6.5 (GLOVE) ×2 IMPLANT
GLOVE SURG UNDER POLY LF SZ6.5 (GLOVE) ×2 IMPLANT
GLOVE SURG UNDER POLY LF SZ8 (GLOVE) ×2
GOWN STRL REUS W/TWL LRG LVL3 (GOWN DISPOSABLE) ×2 IMPLANT
GOWN STRL REUS W/TWL XL LVL3 (GOWN DISPOSABLE) ×2 IMPLANT
GUIDEWIRE GLENOID 2.5X220 (WIRE) ×1 IMPLANT
HANDPIECE INTERPULSE COAX TIP (DISPOSABLE) ×2
HEAD HUMERAL AEQUALIS 52 SHLDR (Miscellaneous) ×1 IMPLANT
HEMOSTAT SURGICEL 2X14 (HEMOSTASIS) ×2 IMPLANT
HOOD PEEL AWAY FLYTE STAYCOOL (MISCELLANEOUS) ×6 IMPLANT
HUMERAL STEM AEQUALIS 3BX74MM (Stem) ×2 IMPLANT
KIT BASIN OR (CUSTOM PROCEDURE TRAY) ×2 IMPLANT
KIT TURNOVER KIT A (KITS) ×2 IMPLANT
MANIFOLD NEPTUNE II (INSTRUMENTS) ×2 IMPLANT
NDL TROCAR POINT SZ 2 1/2 (NEEDLE) ×1 IMPLANT
NEEDLE TROCAR POINT SZ 2 1/2 (NEEDLE) ×2 IMPLANT
NS IRRIG 1000ML POUR BTL (IV SOLUTION) ×2 IMPLANT
PACK SHOULDER (CUSTOM PROCEDURE TRAY) ×2 IMPLANT
PAD COLD SHLDR WRAP-ON (PAD) IMPLANT
PROTECTOR NERVE ULNAR (MISCELLANEOUS) IMPLANT
RESTRAINT HEAD UNIVERSAL NS (MISCELLANEOUS) ×2 IMPLANT
RETRIEVER SUT HEWSON (MISCELLANEOUS) ×2 IMPLANT
SET HNDPC FAN SPRY TIP SCT (DISPOSABLE) ×1 IMPLANT
SLING ARM IMMOBILIZER LRG (SOFTGOODS) ×1 IMPLANT
SMARTMIX MINI TOWER (MISCELLANEOUS) ×2
SPONGE T-LAP 18X18 ~~LOC~~+RFID (SPONGE) ×2 IMPLANT
STEM HUMERAL AEQUALIS 3BX74MM (Stem) IMPLANT
STRIP CLOSURE SKIN 1/2X4 (GAUZE/BANDAGES/DRESSINGS) ×2 IMPLANT
SUCTION FRAZIER HANDLE 12FR (TUBING) ×2
SUCTION TUBE FRAZIER 12FR DISP (TUBING) ×1 IMPLANT
SUPPORT WRAP ARM LG (MISCELLANEOUS) ×2 IMPLANT
SUT ETHIBOND 2 V 37 (SUTURE) ×2 IMPLANT
SUT MNCRL AB 4-0 PS2 18 (SUTURE) ×2 IMPLANT
SUT VIC AB 2-0 CT1 27 (SUTURE) ×4
SUT VIC AB 2-0 CT1 TAPERPNT 27 (SUTURE) ×2 IMPLANT
TAPE LABRALWHITE 1.5X36 (TAPE) ×2 IMPLANT
TAPE STRIPS DRAPE STRL (GAUZE/BANDAGES/DRESSINGS) ×1 IMPLANT
TAPE SUT LABRALTAP WHT/BLK (SUTURE) ×2 IMPLANT
TOWEL OR 17X26 10 PK STRL BLUE (TOWEL DISPOSABLE) ×2 IMPLANT
TOWER SMARTMIX MINI (MISCELLANEOUS) ×1 IMPLANT
WATER STERILE IRR 1000ML POUR (IV SOLUTION) ×2 IMPLANT

## 2021-08-04 NOTE — Anesthesia Procedure Notes (Signed)
Anesthesia Regional Block: Interscalene brachial plexus block   Pre-Anesthetic Checklist: , timeout performed,  Correct Patient, Correct Site, Correct Laterality,  Correct Procedure, Correct Position, site marked,  Risks and benefits discussed,  Surgical consent,  Pre-op evaluation,  At surgeon's request and post-op pain management  Laterality: Left  Prep: chloraprep       Needles:  Injection technique: Single-shot  Needle Type: Echogenic Stimulator Needle     Needle Length: 9cm  Needle Gauge: 21     Additional Needles:   Procedures:,,,, ultrasound used (permanent image in chart),,    Narrative:  Start time: 08/04/2021 9:05 AM End time: 08/04/2021 9:15 AM Injection made incrementally with aspirations every 5 mL.  Performed by: Personally  Anesthesiologist: Murvin Natal, MD  Additional Notes: Functioning IV was confirmed and monitors were applied.  A timeout was performed. Sterile prep, hand hygiene and sterile gloves were used. A 23m 21ga Arrow echogenic stimulator needle was used. Negative aspiration and negative test dose prior to incremental administration of local anesthetic. The patient tolerated the procedure well.  Ultrasound guidance: relevent anatomy identified, needle position confirmed, local anesthetic spread visualized around nerve(s), vascular puncture avoided.  Image printed for medical record.

## 2021-08-04 NOTE — H&P (Signed)
Louis Salazar is an 65 y.o. male.   Chief Complaint: L shoulder pain and dysfunction HPI: Endstage L shoulder arthritis with significant pain and dysfunction, failed conservative measures.  Pain interferes with sleep and quality of life.    Past Medical History:  Diagnosis Date   Arthritis    right shoulder; right knee   DVT (deep venous thrombosis) ([name]) 01/2018   RIGHT POPLITEAL   Enlarged prostate    Foreign body, eye    left eyelid   Gastritis 10/24/2017   GERD (gastroesophageal reflux disease)    nissan plication and this has improved   Hemorrhoid    History of colon polyps 05/24/2015   Hypertension    controlled on med   Neck pain    "EXPERIENCE CRICK IN NECK". comes and goes 2019   Neuromuscular disorder ([name])    hands are tingly and go numb. possibly carpal tunnel.  right hand   Substance abuse ([name])    hx of inhalants, last use 2010.  regular  daily alcohol intake ([name])   Umbilical hernia without obstruction and without gangrene 08/24/2016    Past Surgical History:  Procedure Laterality Date   ABDOMINAL SURGERY  [id number]   nissen fundoplication   BUNIONECTOMY Left 2007   CARPAL TUNNEL RELEASE Right 03/12/2018   Procedure: CARPAL TUNNEL RELEASE;  Surgeon: Hessie Knows, MD;  Location: [facility];  Service: Orthopedics;  Laterality: Right;   COLONOSCOPY WITH PROPOFOL N/A 10/18/2015   Procedure: COLONOSCOPY WITH PROPOFOL;  Surgeon: Lucilla Lame, MD;  Location: [place];  Service: Endoscopy;  Laterality: N/A;   COLONOSCOPY WITH PROPOFOL N/A 05/25/2020   Procedure: COLONOSCOPY WITH PROPOFOL;  Surgeon: Lucilla Lame, MD;  Location: [facility];  Service: Endoscopy;  Laterality: N/A;   CYSTOSCOPY WITH INSERTION OF UROLIFT N/A 03/11/2020   Procedure: CYSTOSCOPY WITH INSERTION OF UROLIFT;  Surgeon: Royston Cowper, MD;  Location: [facility];  Service: Urology;  Laterality: N/A;   ESOPHAGOGASTRODUODENOSCOPY (EGD) WITH PROPOFOL N/A 10/18/2015   Procedure:  ESOPHAGOGASTRODUODENOSCOPY (EGD) WITH PROPOFOL;  Surgeon: Lucilla Lame, MD;  Location: [place];  Service: Endoscopy;  Laterality: N/A;  wants as early as possible   HERNIA REPAIR     IVC FILTER INSERTION N/A 09/24/2018   Procedure: IVC FILTER INSERTION;  Surgeon: [name], MD;  Location: [place] [facility];  Service: Cardiovascular;  Laterality: N/A;   IVC FILTER REMOVAL N/A 11/12/2018   Procedure: IVC FILTER REMOVAL;  Surgeon: [name], MD;  Location: [place] [facility];  Service: Cardiovascular;  Laterality: N/A;   KNEE ARTHROPLASTY Right 10/07/2018   Procedure: COMPUTER ASSISTED TOTAL KNEE ARTHROPLASTY;  Surgeon: Dereck Leep, MD;  Location: [facility];  Service: Orthopedics;  Laterality: Right;   KNEE ARTHROSCOPY Right 02/18/2018   Procedure: ARTHROSCOPY KNEE;  Surgeon: Dereck Leep, MD;  Location: [facility];  Service: Orthopedics;  Laterality: Right;   KNEE ARTHROSCOPY WITH LATERAL MENISECTOMY Right 02/18/2018   Procedure: KNEE ARTHROSCOPY WITH LATERAL MENISECTOMY;  Surgeon: Dereck Leep, MD;  Location: [facility];  Service: Orthopedics;  Laterality: Right;   KNEE ARTHROSCOPY WITH MEDIAL MENISECTOMY Right 02/18/2018   Procedure: KNEE ARTHROSCOPY WITH MEDIAL MENISECTOMY;  Surgeon: Dereck Leep, MD;  Location: [facility];  Service: Orthopedics;  Laterality: Right;   NISSEN FUNDOPLICATION  [id number]   SHOULDER SURGERY  04/2016   Right shoulder replacement   UMBILICAL HERNIA REPAIR N/A 09/12/2016   Procedure: HERNIA REPAIR UMBILICAL ADULT;  Surgeon: Robert Bellow, MD;  Location: [facility];  Service: General;  Laterality: N/A;   WRIST SURGERY Left 1990   bones fused. metal in wrist    Family History  Problem Relation Age of Onset   Cancer Other    COPD Mother    Arthritis Maternal Grandmother    Cancer Maternal Grandmother    Diabetes Maternal Grandmother    Social History:  reports that he quit smoking about 25 years ago. His smoking use included  cigarettes. He has a 30.00 pack-year smoking history. His smokeless tobacco use includes snuff. He reports current alcohol use of about 24.0 standard drinks per week. He reports that he does not use drugs.  Allergies:  Allergies  Allergen Reactions   Celebrex [Celecoxib] Hives and Rash   Sulfa Antibiotics Other (See Comments)    Unknown - childhood allergy    Medications Prior to Admission  Medication Sig Dispense Refill   amoxicillin (AMOXIL) 500 MG capsule Take 2,000 mg by mouth See admin instructions. Take 4 capsules (2000 mg) by mouth 1 hour prior to dental work     [employer] (VITAMIN C) CHEW Chew 1 tablet by mouth in the morning.     clomiPHENE (CLOMID) 50 MG tablet Take 25 mg by mouth in the morning.     Glucosamine HCl (GLUCOSAMINE PO) Take 2 tablets by mouth in the morning.     loperamide (IMODIUM) 2 MG capsule Take 2-4 mg by mouth 2 (two) times daily as needed for diarrhea or loose stools.     Melatonin 10 MG CAPS Take 20 mg by mouth at bedtime.     metoprolol succinate (TOPROL-XL) 25 MG 24 hr tablet Take 1 tablet (25 mg total) by mouth 2 (two) times daily. 90 tablet prn   Multiple Vitamin (MULTIVITAMIN WITH MINERALS) TABS tablet Take 1 tablet by mouth daily. Centrum Silver     temazepam (RESTORIL) 30 MG capsule Take 30 mg by mouth at bedtime.  0   traMADol (ULTRAM) 50 MG tablet Take 50 mg by mouth every 6 (six) hours as needed (pain.).      No results found for this or any previous visit (from the past 48 hour(s)). No results found.  Review of Systems  All other systems reviewed and are negative.  Blood pressure (!) 146/103, pulse 79, temperature 97.7 F (36.5 C), temperature source Oral, resp. rate 18, SpO2 98 %. Physical Exam Constitutional:      Appearance: He is well-developed.  HENT:     Head: Atraumatic.  Eyes:     Extraocular Movements: Extraocular movements intact.  Cardiovascular:     Pulses: Normal pulses.  Pulmonary:     Effort: Pulmonary  effort is normal.  Musculoskeletal:     Comments: L shoulder pain with limited ROM. NVID.  Skin:    General: Skin is warm and dry.  Neurological:     Mental Status: He is alert and oriented to person, place, and time.  Psychiatric:        Mood and Affect: Mood normal.     Assessment/Plan L shoulder endstage OA Plan L TSA Risks / benefits of surgery discussed Consent on chart  NPO for OR Preop antibiotics   Rhae Hammock, MD 08/04/2021, 9:24 AM

## 2021-08-04 NOTE — Anesthesia Postprocedure Evaluation (Signed)
Anesthesia Post Note  Patient: Louis Salazar  Procedure(s) Performed: TOTAL SHOULDER ARTHROPLASTY (Left: Shoulder)     Patient location during evaluation: PACU Anesthesia Type: General Level of consciousness: awake and alert and oriented Pain management: pain level controlled Vital Signs Assessment: post-procedure vital signs reviewed and stable Respiratory status: nonlabored ventilation, respiratory function stable and spontaneous breathing Cardiovascular status: blood pressure returned to baseline and stable Postop Assessment: no apparent nausea or vomiting Anesthetic complications: no   No notable events documented.  Last Vitals:  Vitals:   08/04/21 1245 08/04/21 1300  BP: (!) 110/96 118/83  Pulse: 84 72  Resp: 17 17  Temp:    SpO2: 93% 93%    Last Pain:  Vitals:   08/04/21 1300  TempSrc:   PainSc: 4                  Rei Contee A.

## 2021-08-04 NOTE — Anesthesia Procedure Notes (Addendum)
Procedure Name: Intubation Date/Time: 08/04/2021 10:36 AM Performed by: West Pugh, CRNA Pre-anesthesia Checklist: Patient identified, Emergency Drugs available, Suction available, Patient being monitored and Timeout performed Patient Re-evaluated:Patient Re-evaluated prior to induction Oxygen Delivery Method: Circle system utilized Preoxygenation: Pre-oxygenation with 100% oxygen Induction Type: IV induction Ventilation: Two handed mask ventilation required and Oral airway inserted - appropriate to patient size Laryngoscope Size: 3 and Glidescope Grade View: Grade I Tube type: Oral Tube size: 7.5 mm Number of attempts: 1 Airway Equipment and Method: Rigid stylet and Video-laryngoscopy Placement Confirmation: ETT inserted through vocal cords under direct vision, positive ETCO2, CO2 detector and breath sounds checked- equal and bilateral Secured at: 23 cm Tube secured with: Tape Dental Injury: Teeth and Oropharynx as per pre-operative assessment  Difficulty Due To: Difficulty was anticipated and Difficult Airway- due to large tongue

## 2021-08-04 NOTE — Transfer of Care (Signed)
Immediate Anesthesia Transfer of Care Note  Patient: Louis Salazar  Procedure(s) Performed: TOTAL SHOULDER ARTHROPLASTY (Left: Shoulder)  Patient Location: PACU  Anesthesia Type:GA combined with regional for post-op pain  Level of Consciousness: awake, alert  and patient cooperative  Airway & Oxygen Therapy: Patient Spontanous Breathing and Patient connected to face mask oxygen  Post-op Assessment: Report given to RN and Post -op Vital signs reviewed and stable  Post vital signs: Reviewed and stable  Last Vitals:  Vitals Value Taken Time  BP 132/94 08/04/21 1211  Temp 36.7 C 08/04/21 1211  Pulse 85 08/04/21 1214  Resp 17 08/04/21 1214  SpO2 99 % 08/04/21 1214  Vitals shown include unvalidated device data.  Last Pain:  Vitals:   08/04/21 1211  TempSrc:   PainSc: 0-No pain         Complications: No notable events documented.

## 2021-08-04 NOTE — Discharge Instructions (Signed)
Discharge Instructions after Total Shoulder Arthroplasty   A sling has been provided for you. Remove the sling 5 times each day to perform motion exercises. After the first 48 to 72 hours, discontinue using the sling. You should use the sling as a protective device, if you are in a crowd.  Use ice on the shoulder intermittently over the first 48 hours after surgery.  Pain medication has been prescribed for you.  Use your medication liberally over the first 48 hours, and then begin to taper your use. You may take Extra Strength Tylenol or Tylenol only in place of the pain pills. DO NOT take ANY nonsteroidal anti-inflammatory pain medications: Advil, Motrin, Ibuprofen, Aleve, Naproxen, or Naprosyn. Take one aspirin '81mg'$  a day for 2 weeks after surgery, unless you have an aspirin sensitivity/allergy or asthma. Leave your dressing on until your first follow up visit.  You may shower with the dressing.  Hold your arm as if you still have your sling on while you shower. Active reaching and lifting are not permitted. You may use the operative arm for activities of daily living that do not require the operative arm to leave the side of the body, such as eating, drinking, bathing, etc.  Three to 5 times each day you should perform assisted overhead reaching and external rotation (outward turning) exercises with the operative arm. You were taught these exercises prior to discharge. Both exercises should be done with the non-operative arm used as the "therapist arm" while the operative arm remains relaxed. Ten of each exercise should be done three to five times each day.   Overhead reach is helping to lift your stiff arm up as high as it will go. To stretch your overhead reach, lie flat on your back, relax, and grasp the wrist of the tight shoulder with your opposite hand. Using the power in your opposite arm, bring the stiff arm up as far as it is comfortable. Start holding it for ten seconds and then work up to  where you can hold it for a count of 30. Breathe slowly and deeply while the arm is moved. Repeat this stretch ten times, trying to help the ar up a little higher each time.     External rotation is turning the arm out to the side while your elbow stays close to your body. External rotation is best stretched while you are lying on your back. Hold a cane, yardstick, broom handle, or dowel in both hands. Bend both elbows to a right angle. Use steady, gentle force from your normal arm to rotate the hand of the stiff shoulder out away from your body. Continue the rotation until it is straight in front of you holding it there for a count of 10. Do not go beyond this level of rotation until seen back by Dr. Tamera Punt. Repeat this exercise ten times slowly.      Please call [phone] during normal business hours or [phone] after hours for any problems. Including the following:  - excessive redness of the incisions - drainage for more than 4 days - fever of more than 101.5 F  *Please note that pain medications will not be refilled after hours or on weekends.

## 2021-08-04 NOTE — Op Note (Signed)
Procedure(s): TOTAL SHOULDER ARTHROPLASTY Procedure Note  Louis Salazar male 64 y.o. 08/04/2021  Preoperative diagnosis: Left shoulder end-stage osteoarthritis  Postoperative diagnosis: Same  Procedure(s) and Anesthesia Type:    * TOTAL SHOULDER ARTHROPLASTY - Choice  Surgeon(s) and Role:    Tania Ade, MD - Primary   Indications:  65 y.o. male  With endstage left shoulder arthritis. Pain and dysfunction interfered with quality of life and nonoperative treatment with activity modification, NSAIDS and injections failed.     Surgeon: Rhae Hammock   Assistants: Sheryle Hail PA-C [name] was present and scrubbed throughout the procedure and was essential in positioning, retraction, exposure, and closure)  Anesthesia: General endotracheal anesthesia with preoperative interscalene block given by the attending anesthesiologist    Procedure Detail  TOTAL SHOULDER ARTHROPLASTY  Findings: Tornier flex anatomic press-fit size 3 stem with a 52 x 19 head, cemented size large 40 Cortiloc glenoid.   A lesser tuberosity osteotomy was performed and repaired at the conclusion of the procedure.  Estimated Blood Loss:  200 mL         Drains: None   Blood Given: none          Specimens: none        Complications:  * No complications entered in OR log *         Disposition: PACU - hemodynamically stable.         Condition: stable    Procedure:   The patient was identified in the preoperative holding area where I personally marked the operative extremity after verifying with the patient and consent. He  was taken to the operating room where He was transferred to the   operative table.  The patient received an interscalene block in   the holding area by the attending anesthesiologist.  General anesthesia was induced   in the operating room without complication.  The patient did receive IV  Ancef prior to the commencement of the procedure.  The patient was   placed in  the beach-chair position with the back raised about 30   degrees.  The nonoperative extremity and head and neck were carefully   positioned and padded protecting against neurovascular compromise.  The   left upper extremity was then prepped and draped in the standard sterile   fashion.    The appropriate operative time-out was performed with   Anesthesia, the perioperative staff, as well as myself and we all agreed   that the left side was the correct operative site.  An approximately   10 cm incision was made from the tip of the coracoid to the center point of the   humerus at the level of the axilla.  Dissection was carried down sharply   through subcutaneous tissues and cephalic vein was identified and taken   laterally with the deltoid.  The pectoralis major was taken medially.  The   upper 1 cm of the pectoralis major was released from its attachment on   the humerus.  The clavipectoral fascia was incised just lateral to the   conjoined tendon.  This incision was carried up to but not into the   coracoacromial ligament.  Digital palpation was used to prove   integrity of the axillary nerve which was protected throughout the   procedure.  Musculocutaneous nerve was not palpated in the operative   field.  Conjoined tendon was then retracted gently medially and the   deltoid laterally.  Anterior circumflex humeral vessels were clamped  and   coagulated.  The soft tissues overlying the biceps was incised and this   incision was carried across the transverse humeral ligament to the base   of the coracoid.  The biceps was noted to be severely degenerated. It was released from the superior labrum. The biceps was then tenodesed to the soft tissue just above   pectoralis major and the remaining portion of the biceps superiorly was   excised.  An osteotomy was performed at the lesser tuberosity.  The capsule was then   released all the way down to the 6 o'clock position of the humeral head.   The  humeral head was then delivered with simultaneous adduction,   extension and external rotation.  All humeral osteophytes were removed   and the anatomic neck of the humerus was marked and cut free hand at   approximately 25 degrees retroversion within about 3 mm of the cuff   reflection posteriorly.  The head size was estimated to be a 52 medium   offset.  At that point, the humeral head was retracted posteriorly with   a Fukuda retractor.   Remaining portion of the capsule was released at the base of the   coracoid.  The remaining biceps anchor and the entire anterior-inferior   labrum was excised.  The posterior labrum was also excised but the   posterior capsule was not released.  The guidepin was placed bicortically with non elevated guide.  The reamer was used to ream to concentric bone with punctate bleeding.  This gave an excellent concentric surface.  The center hole was then drilled for an anchor peg glenoid followed by the three peripheral holes and none of the holes   exited the glenoid wall.  I then pulse irrigated these holes and dried   them with Surgicel.  The three peripheral holes were then   pressurized cemented and the anchor peg glenoid was placed and impacted   with an excellent fit.  The glenoid was a 40 large component.  The proximal humerus was then again exposed taking care not to displace the glenoid.    The entry awl was used followed by sounding reamers and then sequentially broached from size 1-3. This was then left in place and the calcar planer was used. Trial head was placed with a 52 x 19.  With the trial implantation of the component,  there was approximately 50% posterior translation with immediate snap back to the   anatomic position.  With forward elevation, there was no tendency   towards posterior subluxation.   The trial was removed and the final implant was prepared on a back table.  The trial was removed and the final implant was prepared on a back table.    3 small holes were drilled on the medial side of the lesser tuberosity osteotomy, through which 2 labral tapes were passed. The implant was then placed through the loop of the 2 labral tapes and impacted with an excellent press-fit. This achieved excellent anatomic reconstruction of the proximal humerus.  The joint was then copiously irrigated with pulse lavage.  The subscapularis and   lesser tuberosity osteotomy were then repaired using the 2 labral tapes previously passed in a double row fashion with horizontal mattress sutures medially brought over through bone tunnels tied over a bone bridge laterally.   One #1 Ethibond was placed at the rotator interval just above   the lesser tuberosity. Copious irrigation was used. Skin was closed with 2-0  Vicryl sutures in the deep dermal layer and 4-0 Monocryl in a subcuticular  running fashion.  Sterile dressings were then applied including Aquacel.  The patient was placed in a sling and allowed to awaken from general anesthesia and taken to the recovery room in stable condition.      POSTOPERATIVE PLAN:  Early passive range of motion will be allowed with the goal of 0 degrees external rotation and 90 degrees forward elevation.  No internal rotation at this time.  No active motion of the arm until the lesser tuberosity heals.  The patient will be observed in the recovery room and if his pain is well controlled with the regional block and he is hemodynamically stable he could be discharged home today with family.

## 2021-08-04 NOTE — Progress Notes (Signed)
AssistedDr. Ellender with left, ultrasound guided, interscalene  block. Side rails up, monitors on throughout procedure. See vital signs in flow sheet. Tolerated Procedure well.  

## 2021-08-09 ENCOUNTER — Encounter (HOSPITAL_COMMUNITY): Payer: Self-pay | Admitting: Orthopedic Surgery

## 2021-12-20 ENCOUNTER — Ambulatory Visit: Payer: BC Managed Care – PPO | Admitting: Gastroenterology
